# Patient Record
Sex: Female | Born: 1990
Health system: Southern US, Community
[De-identification: ages and names within clinical notes are randomized; demographics above are authoritative.]

## PROBLEM LIST (undated history)

## (undated) DIAGNOSIS — T7840XA Allergy, unspecified, initial encounter: Secondary | ICD-10-CM

## (undated) DIAGNOSIS — E039 Hypothyroidism, unspecified: Secondary | ICD-10-CM

## (undated) DIAGNOSIS — E079 Disorder of thyroid, unspecified: Secondary | ICD-10-CM

## (undated) DIAGNOSIS — E8881 Metabolic syndrome: Secondary | ICD-10-CM

## (undated) DIAGNOSIS — M4310 Spondylolisthesis, site unspecified: Secondary | ICD-10-CM

## (undated) DIAGNOSIS — E88819 Insulin resistance, unspecified: Secondary | ICD-10-CM

## (undated) DIAGNOSIS — F419 Anxiety disorder, unspecified: Secondary | ICD-10-CM

## (undated) DIAGNOSIS — J069 Acute upper respiratory infection, unspecified: Secondary | ICD-10-CM

## (undated) DIAGNOSIS — L309 Dermatitis, unspecified: Secondary | ICD-10-CM

## (undated) DIAGNOSIS — L509 Urticaria, unspecified: Secondary | ICD-10-CM

## (undated) DIAGNOSIS — J45909 Unspecified asthma, uncomplicated: Secondary | ICD-10-CM

## (undated) HISTORY — DX: Urticaria, unspecified: L50.9

## (undated) HISTORY — DX: Unspecified asthma, uncomplicated: J45.909

## (undated) HISTORY — PX: ADENOIDECTOMY: SUR15

## (undated) HISTORY — PX: TONSILLECTOMY: SUR1361

## (undated) HISTORY — DX: Acute upper respiratory infection, unspecified: J06.9

## (undated) HISTORY — DX: Spondylolisthesis, site unspecified: M43.10

## (undated) HISTORY — PX: FRACTURE SURGERY: SHX138

## (undated) HISTORY — DX: Disorder of thyroid, unspecified: E07.9

## (undated) HISTORY — DX: Allergy, unspecified, initial encounter: T78.40XA

## (undated) HISTORY — PX: TYMPANOSTOMY TUBE PLACEMENT: SHX32

## (undated) HISTORY — DX: Dermatitis, unspecified: L30.9

## (undated) HISTORY — DX: Anxiety disorder, unspecified: F41.9

---
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
## 2012-11-15 HISTORY — PX: MANDIBLE SURGERY: SHX707

## 2013-11-12 ENCOUNTER — Ambulatory Visit: Payer: Self-pay | Admitting: Gynecology

## 2013-11-18 ENCOUNTER — Encounter: Payer: Self-pay | Admitting: Nurse Practitioner

## 2013-11-18 ENCOUNTER — Ambulatory Visit (INDEPENDENT_AMBULATORY_CARE_PROVIDER_SITE_OTHER): Payer: BC Managed Care – PPO | Admitting: Nurse Practitioner

## 2013-11-18 VITALS — BP 120/76 | HR 84 | Ht 69.0 in | Wt 208.0 lb

## 2013-11-18 DIAGNOSIS — Z0000 Encounter for general adult medical examination without abnormal findings: Secondary | ICD-10-CM

## 2013-11-18 DIAGNOSIS — Z01419 Encounter for gynecological examination (general) (routine) without abnormal findings: Secondary | ICD-10-CM

## 2013-11-18 MED ORDER — ETONOGESTREL-ETHINYL ESTRADIOL 0.12-0.015 MG/24HR VA RING: VAGINAL_RING | VAGINAL | Status: DC

## 2013-11-18 NOTE — Patient Instructions (Signed)
General topics  Next pap or exam is  due in 1 year Take a Women's multivitamin Take 1200 mg. of calcium daily - prefer dietary If any concerns in interim to call back  Breast Self-Awareness Practicing breast self-awareness may pick up problems early, prevent significant medical complications, and possibly save your life. By practicing breast self-awareness, you can become familiar with how your breasts look and feel and if your breasts are changing. This allows you to notice changes early. It can also offer you some reassurance that your breast health is good. One way to learn what is normal for your breasts and whether your breasts are changing is to do a breast self-exam. If you find a lump or something that was not present in the past, it is best to contact your caregiver right away. Other findings that should be evaluated by your caregiver include nipple discharge, especially if it is bloody; skin changes or reddening; areas where the skin seems to be pulled in (retracted); or new lumps and bumps. Breast pain is seldom associated with cancer (malignancy), but should also be evaluated by a caregiver. BREAST SELF-EXAM The best time to examine your breasts is 5 7 days after your menstrual period is over.  ExitCare Patient Information 2013 [employer].   Exercise to Stay Healthy Exercise helps you become and stay healthy. EXERCISE IDEAS AND TIPS Choose exercises that:  You enjoy.  Fit into your day. You do not need to exercise really hard to be healthy. You can do exercises at a slow or medium level and stay healthy. You can:  Stretch before and after working out.  Try yoga, Pilates, or tai chi.  Lift weights.  Walk fast, swim, jog, run, climb stairs, bicycle, dance, or rollerskate.  Take aerobic classes. Exercises that burn about 150 calories:  Running 1  miles in 15 minutes.  Playing volleyball for 45 to 60 minutes.  Washing and waxing a car for 45 to 60  minutes.  Playing touch football for 45 minutes.  Walking 1  miles in 35 minutes.  Pushing a stroller 1  miles in 30 minutes.  Playing basketball for 30 minutes.  Raking leaves for 30 minutes.  Bicycling 5 miles in 30 minutes.  Walking 2 miles in 30 minutes.  Dancing for 30 minutes.  Shoveling snow for 15 minutes.  Swimming laps for 20 minutes.  Walking up stairs for 15 minutes.  Bicycling 4 miles in 15 minutes.  Gardening for 30 to 45 minutes.  Jumping rope for 15 minutes.  Washing windows or floors for 45 to 60 minutes. Document Released: 01/14/2011 Document Revised: 03/05/2012 Document Reviewed: 01/14/2011 ExitCare Patient Information 2013 [employer].   Other topics ( that may be useful information):    Sexually Transmitted Disease Sexually transmitted disease (STD) refers to any infection that is passed from person to person during sexual activity. This may happen by way of saliva, semen, blood, vaginal mucus, or urine. Common STDs include:  Gonorrhea.  Chlamydia.  Syphilis.  HIV/AIDS.  Genital herpes.  Hepatitis B and C.  Trichomonas.  Human papillomavirus (HPV).  Pubic lice. CAUSES  An STD may be spread by bacteria, virus, or parasite. A person can get an STD by:  Sexual intercourse with an infected person.  Sharing sex toys with an infected person.  Sharing needles with an infected person.  Having intimate contact with the genitals, mouth, or rectal areas of an infected person. SYMPTOMS  Some people may not have any symptoms, but   they can still pass the infection to others. Different STDs have different symptoms. Symptoms include:  Painful or bloody urination.  Pain in the pelvis, abdomen, vagina, anus, throat, or eyes.  Skin rash, itching, irritation, growths, or sores (lesions). These usually occur in the genital or anal area.  Abnormal vaginal discharge.  Penile discharge in men.  Soft, flesh-colored skin growths in the  genital or anal area.  Fever.  Pain or bleeding during sexual intercourse.  Swollen glands in the groin area.  Yellow skin and eyes (jaundice). This is seen with hepatitis. DIAGNOSIS  To make a diagnosis, your caregiver may:  Take a medical history.  Perform a physical exam.  Take a specimen (culture) to be examined.  Examine a sample of discharge under a microscope.  Perform blood test TREATMENT   Chlamydia, gonorrhea, trichomonas, and syphilis can be cured with antibiotic medicine.  Genital herpes, hepatitis, and HIV can be treated, but not cured, with prescribed medicines. The medicines will lessen the symptoms.  Genital warts from HPV can be treated with medicine or by freezing, burning (electrocautery), or surgery. Warts may come back.  HPV is a virus and cannot be cured with medicine or surgery.However, abnormal areas may be followed very closely by your caregiver and may be removed from the cervix, vagina, or vulva through office procedures or surgery. If your diagnosis is confirmed, your recent sexual partners need treatment. This is true even if they are symptom-free or have a negative culture or evaluation. They should not have sex until their caregiver says it is okay. HOME CARE INSTRUCTIONS  All sexual partners should be informed, tested, and treated for all STDs.  Take your antibiotics as directed. Finish them even if you start to feel better.  Only take over-the-counter or prescription medicines for pain, discomfort, or fever as directed by your caregiver.  Rest.  Eat a balanced diet and drink enough fluids to keep your urine clear or pale yellow.  Do not have sex until treatment is completed and you have followed up with your caregiver. STDs should be checked after treatment.  Keep all follow-up appointments, Pap tests, and blood tests as directed by your caregiver.  Only use latex condoms and water-soluble lubricants during sexual activity. Do not use  petroleum jelly or oils.  Avoid alcohol and illegal drugs.  Get vaccinated for HPV and hepatitis. If you have not received these vaccines in the past, talk to your caregiver about whether one or both might be right for you.  Avoid risky sex practices that can break the skin. The only way to avoid getting an STD is to avoid all sexual activity.Latex condoms and dental dams (for oral sex) will help lessen the risk of getting an STD, but will not completely eliminate the risk. SEEK MEDICAL CARE IF:   You have a fever.  You have any new or worsening symptoms. Document Released: 03/04/2003 Document Revised: 03/05/2012 Document Reviewed: 03/11/2011 ExitCare Patient Information 2013 [employer].    Domestic Abuse You are being battered or abused if someone close to you hits, pushes, or physically hurts you in any way. You also are being abused if you are forced into activities. You are being sexually abused if you are forced to have sexual contact of any kind. You are being emotionally abused if you are made to feel worthless or if you are constantly threatened. It is important to remember that help is available. No one has the right to abuse you. PREVENTION OF FURTHER   ABUSE  Learn the warning signs of danger. This varies with situations but may include: the use of alcohol, threats, isolation from friends and family, or forced sexual contact. Leave if you feel that violence is going to occur.  If you are attacked or beaten, report it to the police so the abuse is documented. You do not have to press charges. The police can protect you while you or the attackers are leaving. Get the officer's name and badge number and a copy of the report.  Find someone you can trust and tell them what is happening to you: your caregiver, a nurse, clergy member, close friend or family member. Feeling ashamed is natural, but remember that you have done nothing wrong. No one deserves abuse. Document Released:  12/09/2000 Document Revised: 03/05/2012 Document Reviewed: 02/17/2011 ExitCare Patient Information 2013 [employer].    How Much is Too Much Alcohol? Drinking too much alcohol can cause injury, accidents, and health problems. These types of problems can include:   Car crashes.  Falls.  Family fighting (domestic violence).  Drowning.  Fights.  Injuries.  Burns.  Damage to certain organs.  Having a baby with birth defects. ONE DRINK CAN BE TOO MUCH WHEN YOU ARE:  Working.  Pregnant or breastfeeding.  Taking medicines. Ask your doctor.  Driving or planning to drive. If you or someone you know has a drinking problem, get help from a doctor.  Document Released: 10/08/2009 Document Revised: 03/05/2012 Document Reviewed: 10/08/2009 ExitCare Patient Information 2013 [employer].   Smoking Hazards Smoking cigarettes is extremely bad for your health. Tobacco smoke has over 200 known poisons in it. There are over 60 chemicals in tobacco smoke that cause cancer. Some of the chemicals found in cigarette smoke include:   Cyanide.  Benzene.  Formaldehyde.  Methanol (wood alcohol).  Acetylene (fuel used in welding torches).  Ammonia. Cigarette smoke also contains the poisonous gases nitrogen oxide and carbon monoxide.  Cigarette smokers have an increased risk of many serious medical problems and Smoking causes approximately:  90% of all lung cancer deaths in men.  80% of all lung cancer deaths in women.  90% of deaths from chronic obstructive lung disease. Compared with nonsmokers, smoking increases the risk of:  Coronary heart disease by 2 to 4 times.  Stroke by 2 to 4 times.  Men developing lung cancer by 23 times.  Women developing lung cancer by 13 times.  Dying from chronic obstructive lung diseases by 12 times.  . Smoking is the most preventable cause of death and disease in our society.  WHY IS SMOKING ADDICTIVE?  Nicotine is the chemical  agent in tobacco that is capable of causing addiction or dependence.  When you smoke and inhale, nicotine is absorbed rapidly into the bloodstream through your lungs. Nicotine absorbed through the lungs is capable of creating a powerful addiction. Both inhaled and non-inhaled nicotine may be addictive.  Addiction studies of cigarettes and spit tobacco show that addiction to nicotine occurs mainly during the teen years, when young people begin using tobacco products. WHAT ARE THE BENEFITS OF QUITTING?  There are many health benefits to quitting smoking.   Likelihood of developing cancer and heart disease decreases. Health improvements are seen almost immediately.  Blood pressure, pulse rate, and breathing patterns start returning to normal soon after quitting. QUITTING SMOKING   American Lung Association - 1-800-LUNGUSA  American Cancer Society - 1-800-ACS-2345 Document Released: 01/19/2005 Document Revised: 03/05/2012 Document Reviewed: 09/23/2009 ExitCare Patient Information 2013 [employer].   Stress Management Stress is a state of physical or mental tension that often results from changes in your life or normal routine. Some common causes of stress are:  Death of a loved one.  Injuries or severe illnesses.  Getting fired or changing jobs.  Moving into a new home. Other causes may be:  Sexual problems.  Business or financial losses.  Taking on a large debt.  Regular conflict with someone at home or at work.  Constant tiredness from lack of sleep. It is not just bad things that are stressful. It may be stressful to:  Win the lottery.  Get married.  Buy a new car. The amount of stress that can be easily tolerated varies from person to person. Changes generally cause stress, regardless of the types of change. Too much stress can affect your health. It may lead to physical or emotional problems. Too little stress (boredom) may also become stressful. SUGGESTIONS TO  REDUCE STRESS:  Talk things over with your family and friends. It often is helpful to share your concerns and worries. If you feel your problem is serious, you may want to get help from a professional counselor.  Consider your problems one at a time instead of lumping them all together. Trying to take care of everything at once may seem impossible. List all the things you need to do and then start with the most important one. Set a goal to accomplish 2 or 3 things each day. If you expect to do too many in a single day you will naturally fail, causing you to feel even more stressed.  Do not use alcohol or drugs to relieve stress. Although you may feel better for a short time, they do not remove the problems that caused the stress. They can also be habit forming.  Exercise regularly - at least 3 times per week. Physical exercise can help to relieve that "uptight" feeling and will relax you.  The shortest distance between despair and hope is often a good night's sleep.  Go to bed and get up on time allowing yourself time for appointments without being rushed.  Take a short "time-out" period from any stressful situation that occurs during the day. Close your eyes and take some deep breaths. Starting with the muscles in your face, tense them, hold it for a few seconds, then relax. Repeat this with the muscles in your neck, shoulders, hand, stomach, back and legs.  Take good care of yourself. Eat a balanced diet and get plenty of rest.  Schedule time for having fun. Take a break from your daily routine to relax. HOME CARE INSTRUCTIONS   Call if you feel overwhelmed by your problems and feel you can no longer manage them on your own.  Return immediately if you feel like hurting yourself or someone else. Document Released: 06/07/2001 Document Revised: 03/05/2012 Document Reviewed: 01/28/2008 ExitCare Patient Information 2013 [employer].   

## 2013-11-18 NOTE — Progress Notes (Signed)
Patient ID: Madison Cooper, female   DOB: 02/15/1991, 22 y.o.   MRN: [id number] 22 y.o. G0P0 Single Caucasian Fe here for annual exam.  Same partner for 2 years. Still in school. Menses lasting 3-4 days.  No cramps, some headaches and low back pain.  Patient's last menstrual period was 10/21/2013.          Sexually active: yes  The current method of family planning is Nuva Ring vaginal inserts.    Exercising: yes  Gym/ health club routine includes cardio and light weights. Smoker:  no  Health Maintenance: Pap: never TDaP: 2012 Gardasil: completed in 2006 Labs: declined   reports that she has never smoked. She has never used smokeless tobacco. She reports that she does not drink alcohol or use illicit drugs.  History reviewed. No pertinent past medical history.  Past Surgical History  Procedure Laterality Date  . Tonsillectomy and adenoidectomy  1/12  . Wisdom tooth extraction  12/11  . Ankle surgery Left 2010  . Mandible surgery  11/15/12    Current Outpatient Prescriptions  Medication Sig Dispense Refill  . glucosamine-chondroitin 500-400 MG tablet Take 1 tablet by mouth 3 (three) times daily.      . Multiple Vitamin (MULTIVITAMIN) tablet Take 1 tablet by mouth daily.      Marland Kitchen NUVARING 0.12-0.015 MG/24HR vaginal ring        No current facility-administered medications for this visit.    Family History  Problem Relation Age of Onset  . Heart disease Maternal Grandfather   . Heart disease Paternal Grandfather   . Diabetes Maternal Grandfather   . Diabetes Paternal Grandfather     ROS:  Pertinent items are noted in HPI.  Otherwise, a comprehensive ROS was negative.  Exam:   BP 120/76  Pulse 84  Ht 5\' 9"  (1.753 m)  Wt 208 lb (94.348 kg)  BMI 30.70 kg/m2  LMP 10/21/2013 Height: 5\' 9"  (175.3 cm)  Ht Readings from Last 3 Encounters:  11/18/13 5\' 9"  (1.753 m)    General appearance: alert, cooperative and appears stated age Head: Normocephalic, without obvious abnormality,  atraumatic Neck: no adenopathy, supple, symmetrical, trachea midline and thyroid normal to inspection and palpation Lungs: clear to auscultation bilaterally Breasts: normal appearance, no masses or tenderness Heart: regular rate and rhythm Abdomen: soft, non-tender; no masses,  no organomegaly Extremities: extremities normal, atraumatic, no cyanosis or edema Skin: Skin color, texture, turgor normal. No rashes or lesions Lymph nodes: Cervical, supraclavicular, and axillary nodes normal. No abnormal inguinal nodes palpated Neurologic: Grossly normal   Pelvic: External genitalia:  no lesions              Urethra:  normal appearing urethra with no masses, tenderness or lesions              Bartholin's and Skene's: normal                 Vagina: normal appearing vagina with normal color and discharge, no lesions              Cervix: anteverted              Pap taken: no declined STD's Bimanual Exam:  Uterus:  normal size, contour, position, consistency, mobility, non-tender              Adnexa: no mass, fullness, tenderness               Rectovaginal: Confirms  Anus:  normal sphincter tone, no lesions  A:  Well Woman with normal exam  Contraception with Nuva Ring  P:   Pap smear as per guidelines not due till next year  Refill Nuva Ring for a year   Counseled on breast self exam, STD prevention, adequate intake of calcium and vitamin D, diet and exercise return annually or prn  An After Visit Summary was printed and given to the patient.

## 2013-11-20 NOTE — Progress Notes (Signed)
Encounter reviewed by Dr. Porchea Charrier Silva.  

## 2014-11-27 ENCOUNTER — Ambulatory Visit: Payer: BC Managed Care – PPO | Admitting: Nurse Practitioner

## 2014-12-04 ENCOUNTER — Ambulatory Visit (INDEPENDENT_AMBULATORY_CARE_PROVIDER_SITE_OTHER): Payer: BC Managed Care – PPO | Admitting: Nurse Practitioner

## 2014-12-04 ENCOUNTER — Encounter: Payer: Self-pay | Admitting: Nurse Practitioner

## 2014-12-04 VITALS — BP 120/74 | HR 84 | Resp 16 | Ht 70.5 in | Wt 219.0 lb

## 2014-12-04 DIAGNOSIS — R8290 Unspecified abnormal findings in urine: Secondary | ICD-10-CM

## 2014-12-04 DIAGNOSIS — Z0000 Encounter for general adult medical examination without abnormal findings: Secondary | ICD-10-CM

## 2014-12-04 DIAGNOSIS — Z01419 Encounter for gynecological examination (general) (routine) without abnormal findings: Secondary | ICD-10-CM

## 2014-12-04 LAB — POCT URINALYSIS DIPSTICK
Urobilinogen, UA: NEGATIVE
pH, UA: 5

## 2014-12-04 LAB — HEMOGLOBIN, FINGERSTICK: Hemoglobin, fingerstick: 14.4 g/dL (ref 12.0–16.0)

## 2014-12-04 MED ORDER — ETONOGESTREL-ETHINYL ESTRADIOL 0.12-0.015 MG/24HR VA RING: VAGINAL_RING | VAGINAL | Status: DC

## 2014-12-04 NOTE — Progress Notes (Signed)
23 y.o. G0P0 Single Caucasian Fe here for annual exam.  Menses normally at 3-4 days. Moderate to light.  Occasional heavier every 3-4 months.  Likes Paediatric nurseuva Ring.  Same partner for 3 years.  Patient's last menstrual period was 11/04/2014.          Sexually active: Yes.    The current method of family planning is Paediatric nurseuva Ring vaginal inserts.    Exercising: Yes.    moderate cardio, weight training  2-3 x/wk Smoker:  no  Health Maintenance: Pap:  Never had one TDaP:  Within 10 years  Labs: Hgb: 14.4 ; Urine: Leuk's 2   reports that she has never smoked. She has never used smokeless tobacco. She reports that she does not drink alcohol or use illicit drugs.  History reviewed. No pertinent past medical history.  Past Surgical History  Procedure Laterality Date  . Tonsillectomy and adenoidectomy  1/12  . Wisdom tooth extraction  12/11  . Ankle surgery Left 2010  . Mandible surgery  11/15/12    Current Outpatient Prescriptions  Medication Sig Dispense Refill  . etonogestrel-ethinyl estradiol (NUVARING) 0.12-0.015 MG/24HR vaginal ring Use for 3 weeks, then remove for 1 week. 3 each 3  . glucosamine-chondroitin 500-400 MG tablet Take 1 tablet by mouth 3 (three) times daily.    . Multiple Vitamin (MULTIVITAMIN) tablet Take 1 tablet by mouth daily.     No current facility-administered medications for this visit.    Family History  Problem Relation Age of Onset  . Heart disease Maternal Grandfather   . Heart disease Paternal Grandfather   . Diabetes Maternal Grandfather   . Diabetes Paternal Grandfather     ROS:  Pertinent items are noted in HPI.  Otherwise, a comprehensive ROS was negative.  Exam:   BP 120/74 mmHg  Pulse 84  Resp 16  Ht 5' 10.5" (1.791 m)  Wt 219 lb (99.338 kg)  BMI 30.97 kg/m2  LMP 11/04/2014 Height: 5' 10.5" (179.1 cm)  Ht Readings from Last 3 Encounters:  12/04/14 5' 10.5" (1.791 m)  11/18/13 5\' 9"  (1.753 m)    General appearance: alert, cooperative and  appears stated age Head: Normocephalic, without obvious abnormality, atraumatic Neck: no adenopathy, supple, symmetrical, trachea midline and thyroid normal to inspection and palpation Lungs: clear to auscultation bilaterally Breasts: normal appearance, no masses or tenderness Heart: regular rate and rhythm Abdomen: soft, non-tender; no masses,  no organomegaly Extremities: extremities normal, atraumatic, no cyanosis or edema Skin: Skin color, texture, turgor normal. No rashes or lesions Lymph nodes: Cervical, supraclavicular, and axillary nodes normal. No abnormal inguinal nodes palpated Neurologic: Grossly normal   Pelvic: External genitalia:  no lesions              Urethra:  normal appearing urethra with no masses, tenderness or lesions              Bartholin's and Skene's: normal                 Vagina: normal appearing vagina with normal color and discharge, no lesions              Cervix: anteverted              Pap taken: Yes.   Bimanual Exam:  Uterus:  normal size, contour, position, consistency, mobility, non-tender              Adnexa: no mass, fullness, tenderness  Rectovaginal: Confirms               Anus:  normal sphincter tone, no lesions  A:  Well Woman with normal exam  Contraception with Nuva Ring  History of arthritis  R/O UTI - asymptomatic - no med's given    P:   Reviewed health and wellness pertinent to exam  Pap smear taken today (1 st pap)  Refill Nuva Ring for a year  Follow with urine  Counseled on breast self exam, STD prevention, use and side effects of OCP's, adequate intake of calcium and vitamin D, diet and exercise return annually or prn  An After Visit Summary was printed and given to the patient.

## 2014-12-04 NOTE — Patient Instructions (Signed)
General topics  Next pap or exam is  due in 1 year Take a Women's multivitamin Take 1200 mg. of calcium daily - prefer dietary If any concerns in interim to call back  Breast Self-Awareness Practicing breast self-awareness may pick up problems early, prevent significant medical complications, and possibly save your life. By practicing breast self-awareness, you can become familiar with how your breasts look and feel and if your breasts are changing. This allows you to notice changes early. It can also offer you some reassurance that your breast health is good. One way to learn what is normal for your breasts and whether your breasts are changing is to do a breast self-exam. If you find a lump or something that was not present in the past, it is best to contact your caregiver right away. Other findings that should be evaluated by your caregiver include nipple discharge, especially if it is bloody; skin changes or reddening; areas where the skin seems to be pulled in (retracted); or new lumps and bumps. Breast pain is seldom associated with cancer (malignancy), but should also be evaluated by a caregiver. BREAST SELF-EXAM The best time to examine your breasts is 5 7 days after your menstrual period is over.  ExitCare Patient Information 2013 [employer].   Exercise to Stay Healthy Exercise helps you become and stay healthy. EXERCISE IDEAS AND TIPS Choose exercises that:  You enjoy.  Fit into your day. You do not need to exercise really hard to be healthy. You can do exercises at a slow or medium level and stay healthy. You can:  Stretch before and after working out.  Try yoga, Pilates, or tai chi.  Lift weights.  Walk fast, swim, jog, run, climb stairs, bicycle, dance, or rollerskate.  Take aerobic classes. Exercises that burn about 150 calories:  Running 1  miles in 15 minutes.  Playing volleyball for 45 to 60 minutes.  Washing and waxing a car for 45 to 60  minutes.  Playing touch football for 45 minutes.  Walking 1  miles in 35 minutes.  Pushing a stroller 1  miles in 30 minutes.  Playing basketball for 30 minutes.  Raking leaves for 30 minutes.  Bicycling 5 miles in 30 minutes.  Walking 2 miles in 30 minutes.  Dancing for 30 minutes.  Shoveling snow for 15 minutes.  Swimming laps for 20 minutes.  Walking up stairs for 15 minutes.  Bicycling 4 miles in 15 minutes.  Gardening for 30 to 45 minutes.  Jumping rope for 15 minutes.  Washing windows or floors for 45 to 60 minutes. Document Released: 01/14/2011 Document Revised: 03/05/2012 Document Reviewed: 01/14/2011 ExitCare Patient Information 2013 [employer].   Other topics ( that may be useful information):    Sexually Transmitted Disease Sexually transmitted disease (STD) refers to any infection that is passed from person to person during sexual activity. This may happen by way of saliva, semen, blood, vaginal mucus, or urine. Common STDs include:  Gonorrhea.  Chlamydia.  Syphilis.  HIV/AIDS.  Genital herpes.  Hepatitis B and C.  Trichomonas.  Human papillomavirus (HPV).  Pubic lice. CAUSES  An STD may be spread by bacteria, virus, or parasite. A person can get an STD by:  Sexual intercourse with an infected person.  Sharing sex toys with an infected person.  Sharing needles with an infected person.  Having intimate contact with the genitals, mouth, or rectal areas of an infected person. SYMPTOMS  Some people may not have any symptoms, but   they can still pass the infection to others. Different STDs have different symptoms. Symptoms include:  Painful or bloody urination.  Pain in the pelvis, abdomen, vagina, anus, throat, or eyes.  Skin rash, itching, irritation, growths, or sores (lesions). These usually occur in the genital or anal area.  Abnormal vaginal discharge.  Penile discharge in men.  Soft, flesh-colored skin growths in the  genital or anal area.  Fever.  Pain or bleeding during sexual intercourse.  Swollen glands in the groin area.  Yellow skin and eyes (jaundice). This is seen with hepatitis. DIAGNOSIS  To make a diagnosis, your caregiver may:  Take a medical history.  Perform a physical exam.  Take a specimen (culture) to be examined.  Examine a sample of discharge under a microscope.  Perform blood test TREATMENT   Chlamydia, gonorrhea, trichomonas, and syphilis can be cured with antibiotic medicine.  Genital herpes, hepatitis, and HIV can be treated, but not cured, with prescribed medicines. The medicines will lessen the symptoms.  Genital warts from HPV can be treated with medicine or by freezing, burning (electrocautery), or surgery. Warts may come back.  HPV is a virus and cannot be cured with medicine or surgery.However, abnormal areas may be followed very closely by your caregiver and may be removed from the cervix, vagina, or vulva through office procedures or surgery. If your diagnosis is confirmed, your recent sexual partners need treatment. This is true even if they are symptom-free or have a negative culture or evaluation. They should not have sex until their caregiver says it is okay. HOME CARE INSTRUCTIONS  All sexual partners should be informed, tested, and treated for all STDs.  Take your antibiotics as directed. Finish them even if you start to feel better.  Only take over-the-counter or prescription medicines for pain, discomfort, or fever as directed by your caregiver.  Rest.  Eat a balanced diet and drink enough fluids to keep your urine clear or pale yellow.  Do not have sex until treatment is completed and you have followed up with your caregiver. STDs should be checked after treatment.  Keep all follow-up appointments, Pap tests, and blood tests as directed by your caregiver.  Only use latex condoms and water-soluble lubricants during sexual activity. Do not use  petroleum jelly or oils.  Avoid alcohol and illegal drugs.  Get vaccinated for HPV and hepatitis. If you have not received these vaccines in the past, talk to your caregiver about whether one or both might be right for you.  Avoid risky sex practices that can break the skin. The only way to avoid getting an STD is to avoid all sexual activity.Latex condoms and dental dams (for oral sex) will help lessen the risk of getting an STD, but will not completely eliminate the risk. SEEK MEDICAL CARE IF:   You have a fever.  You have any new or worsening symptoms. Document Released: 03/04/2003 Document Revised: 03/05/2012 Document Reviewed: 03/11/2011 [facility] Patient Information 2013 [employer].    Domestic Abuse You are being battered or abused if someone close to you hits, pushes, or physically hurts you in any way. You also are being abused if you are forced into activities. You are being sexually abused if you are forced to have sexual contact of any kind. You are being emotionally abused if you are made to feel worthless or if you are constantly threatened. It is important to remember that help is available. No one has the right to abuse you. PREVENTION OF FURTHER  ABUSE  Learn the warning signs of danger. This varies with situations but may include: the use of alcohol, threats, isolation from friends and family, or forced sexual contact. Leave if you feel that violence is going to occur.  If you are attacked or beaten, report it to the police so the abuse is documented. You do not have to press charges. The police can protect you while you or the attackers are leaving. Get the officer's name and badge number and a copy of the report.  Find someone you can trust and tell them what is happening to you: your caregiver, a nurse, clergy member, close friend or family member. Feeling ashamed is natural, but remember that you have done nothing wrong. No one deserves abuse. Document Released:  12/09/2000 Document Revised: 03/05/2012 Document Reviewed: 02/17/2011 ExitCare Patient Information 2013 [employer].    How Much is Too Much Alcohol? Drinking too much alcohol can cause injury, accidents, and health problems. These types of problems can include:   Car crashes.  Falls.  Family fighting (domestic violence).  Drowning.  Fights.  Injuries.  Burns.  Damage to certain organs.  Having a baby with birth defects. ONE DRINK CAN BE TOO MUCH WHEN YOU ARE:  Working.  Pregnant or breastfeeding.  Taking medicines. Ask your doctor.  Driving or planning to drive. If you or someone you know has a drinking problem, get help from a doctor.  Document Released: 10/08/2009 Document Revised: 03/05/2012 Document Reviewed: 10/08/2009 ExitCare Patient Information 2013 [employer].   Smoking Hazards Smoking cigarettes is extremely bad for your health. Tobacco smoke has over 200 known poisons in it. There are over 60 chemicals in tobacco smoke that cause cancer. Some of the chemicals found in cigarette smoke include:   Cyanide.  Benzene.  Formaldehyde.  Methanol (wood alcohol).  Acetylene (fuel used in welding torches).  Ammonia. Cigarette smoke also contains the poisonous gases nitrogen oxide and carbon monoxide.  Cigarette smokers have an increased risk of many serious medical problems and Smoking causes approximately:  90% of all lung cancer deaths in men.  80% of all lung cancer deaths in women.  90% of deaths from chronic obstructive lung disease. Compared with nonsmokers, smoking increases the risk of:  Coronary heart disease by 2 to 4 times.  Stroke by 2 to 4 times.  Men developing lung cancer by 23 times.  Women developing lung cancer by 13 times.  Dying from chronic obstructive lung diseases by 12 times.  . Smoking is the most preventable cause of death and disease in our society.  WHY IS SMOKING ADDICTIVE?  Nicotine is the chemical  agent in tobacco that is capable of causing addiction or dependence.  When you smoke and inhale, nicotine is absorbed rapidly into the bloodstream through your lungs. Nicotine absorbed through the lungs is capable of creating a powerful addiction. Both inhaled and non-inhaled nicotine may be addictive.  Addiction studies of cigarettes and spit tobacco show that addiction to nicotine occurs mainly during the teen years, when young people begin using tobacco products. WHAT ARE THE BENEFITS OF QUITTING?  There are many health benefits to quitting smoking.   Likelihood of developing cancer and heart disease decreases. Health improvements are seen almost immediately.  Blood pressure, pulse rate, and breathing patterns start returning to normal soon after quitting. QUITTING SMOKING   American Lung Association - 1-800-LUNGUSA  American Cancer Society - 1-800-ACS-2345 Document Released: 01/19/2005 Document Revised: 03/05/2012 Document Reviewed: 09/23/2009 ExitCare Patient Information 2013 [employer].   Stress Management Stress is a state of physical or mental tension that often results from changes in your life or normal routine. Some common causes of stress are:  Death of a loved one.  Injuries or severe illnesses.  Getting fired or changing jobs.  Moving into a new home. Other causes may be:  Sexual problems.  Business or financial losses.  Taking on a large debt.  Regular conflict with someone at home or at work.  Constant tiredness from lack of sleep. It is not just bad things that are stressful. It may be stressful to:  Win the lottery.  Get married.  Buy a new car. The amount of stress that can be easily tolerated varies from person to person. Changes generally cause stress, regardless of the types of change. Too much stress can affect your health. It may lead to physical or emotional problems. Too little stress (boredom) may also become stressful. SUGGESTIONS TO  REDUCE STRESS:  Talk things over with your family and friends. It often is helpful to share your concerns and worries. If you feel your problem is serious, you may want to get help from a professional counselor.  Consider your problems one at a time instead of lumping them all together. Trying to take care of everything at once may seem impossible. List all the things you need to do and then start with the most important one. Set a goal to accomplish 2 or 3 things each day. If you expect to do too many in a single day you will naturally fail, causing you to feel even more stressed.  Do not use alcohol or drugs to relieve stress. Although you may feel better for a short time, they do not remove the problems that caused the stress. They can also be habit forming.  Exercise regularly - at least 3 times per week. Physical exercise can help to relieve that "uptight" feeling and will relax you.  The shortest distance between despair and hope is often a good night's sleep.  Go to bed and get up on time allowing yourself time for appointments without being rushed.  Take a short "time-out" period from any stressful situation that occurs during the day. Close your eyes and take some deep breaths. Starting with the muscles in your face, tense them, hold it for a few seconds, then relax. Repeat this with the muscles in your neck, shoulders, hand, stomach, back and legs.  Take good care of yourself. Eat a balanced diet and get plenty of rest.  Schedule time for having fun. Take a break from your daily routine to relax. HOME CARE INSTRUCTIONS   Call if you feel overwhelmed by your problems and feel you can no longer manage them on your own.  Return immediately if you feel like hurting yourself or someone else. Document Released: 06/07/2001 Document Revised: 03/05/2012 Document Reviewed: 01/28/2008 ExitCare Patient Information 2013 [employer].  

## 2014-12-06 LAB — URINE CULTURE
Colony Count: NO GROWTH
Organism ID, Bacteria: NO GROWTH

## 2014-12-07 LAB — IPS PAP TEST WITH REFLEX TO HPV

## 2014-12-07 NOTE — Progress Notes (Signed)
Encounter reviewed by Dr. Ibrahem Volkman Silva.  

## 2015-12-07 ENCOUNTER — Ambulatory Visit (INDEPENDENT_AMBULATORY_CARE_PROVIDER_SITE_OTHER): Payer: 59 | Admitting: Nurse Practitioner

## 2015-12-07 ENCOUNTER — Encounter: Payer: Self-pay | Admitting: Nurse Practitioner

## 2015-12-07 VITALS — BP 120/76 | HR 72 | Ht 70.0 in | Wt 239.0 lb

## 2015-12-07 DIAGNOSIS — Z113 Encounter for screening for infections with a predominantly sexual mode of transmission: Secondary | ICD-10-CM

## 2015-12-07 DIAGNOSIS — Z0000 Encounter for general adult medical examination without abnormal findings: Secondary | ICD-10-CM

## 2015-12-07 DIAGNOSIS — Z01419 Encounter for gynecological examination (general) (routine) without abnormal findings: Secondary | ICD-10-CM

## 2015-12-07 MED ORDER — ETONOGESTREL-ETHINYL ESTRADIOL 0.12-0.015 MG/24HR VA RING: VAGINAL_RING | VAGINAL | Status: DC

## 2015-12-07 NOTE — Progress Notes (Signed)
Patient ID: Madison Cooper, female   DOB: August 13, 1991, 24 y.o.   MRN: [id number]  24 y.o. G0P0 Single  Caucasian Fe here for annual exam.  Menses now at 3-4 days. No cramps, some PMS.  Same partner for 4 yrs. No concerns about STD's.  Still in school and doing internship now.  Patient's last menstrual period was 11/25/2015 (exact date).          Sexually active: Yes.    The current method of family planning is Paediatric nurse vaginal inserts.    Exercising: Yes.    cardio and weights Smoker:  no  Health Maintenance: Pap: 12/04/14, Negative TDaP: UTD Gardasil: completed in 2006 Labs: HB: 13.7   Urine: Negative    reports that she has never smoked. She has never used smokeless tobacco. She reports that she does not drink alcohol or use illicit drugs.  History reviewed. No pertinent past medical history.  Past Surgical History  Procedure Laterality Date  . Tonsillectomy and adenoidectomy  1/12  . Wisdom tooth extraction  12/11  . Ankle surgery Left 2010  . Mandible surgery  11/15/12    Current Outpatient Prescriptions  Medication Sig Dispense Refill  . etonogestrel-ethinyl estradiol (NUVARING) 0.12-0.015 MG/24HR vaginal ring Use for 3 weeks, then remove for 1 week. 3 each 4   No current facility-administered medications for this visit.    Family History  Problem Relation Age of Onset  . Heart disease Maternal Grandfather   . Heart disease Paternal Grandfather   . Diabetes Maternal Grandfather   . Diabetes Paternal Grandfather     ROS:  Pertinent items are noted in HPI.  Otherwise, a comprehensive ROS was negative.  Exam:   BP 120/76 mmHg  Pulse 72  Ht  (1.778 m)  Wt 239 lb (108.41 kg)  BMI 34.29 kg/m2  LMP 11/25/2015 (Exact Date) Height:  (177.8 cm) Ht Readings from Last 3 Encounters:  12/07/15  (1.778 m)  12/04/14 5' 10.5" (1.791 m)  11/18/13  (1.753 m)    General appearance: alert, cooperative and appears stated age Head: Normocephalic, without  obvious abnormality, atraumatic Neck: no adenopathy, supple, symmetrical, trachea midline and thyroid normal to inspection and palpation Lungs: clear to auscultation bilaterally Breasts: normal appearance, no masses or tenderness Heart: regular rate and rhythm Abdomen: soft, non-tender; no masses,  no organomegaly Extremities: extremities normal, atraumatic, no cyanosis or edema Skin: Skin color, texture, turgor normal. No rashes or lesions Lymph nodes: Cervical, supraclavicular, and axillary nodes normal. No abnormal inguinal nodes palpated Neurologic: Grossly normal   Pelvic: External genitalia:  no lesions              Urethra:  normal appearing urethra with no masses, tenderness or lesions              Bartholin's and Skene's: normal                 Vagina: normal appearing vagina with normal color and discharge, no lesions              Cervix: anteverted              Pap taken: No. Bimanual Exam:  Uterus:  normal size, contour, position, consistency, mobility, non-tender              Adnexa: no mass, fullness, tenderness               Rectovaginal: Confirms  Anus:  normal sphincter tone, no lesions  Chaperone present: yes  A:  Well Woman with normal exam  Contraception with Nuva Ring History of arthritis R/O STD's to update health maintenance    P:   Reviewed health and wellness pertinent to exam  Pap smear as above  Refill on Nuva Ring for a year  Counseled on breast self exam, STD prevention, HIV risk factors and prevention, use and side effects of Nuva Ring, adequate intake of calcium and vitamin D, diet and exercise return annually or prn  An After Visit Summary was printed and given to the patient.

## 2015-12-07 NOTE — Patient Instructions (Addendum)
General topics  Next pap or exam is  due in 1 year Take a Women's multivitamin Take 1200 mg. of calcium daily - prefer dietary If any concerns in interim to call back  Breast Self-Awareness Practicing breast self-awareness may pick up problems early, prevent significant medical complications, and possibly save your life. By practicing breast self-awareness, you can become familiar with how your breasts look and feel and if your breasts are changing. This allows you to notice changes early. It can also offer you some reassurance that your breast health is good. One way to learn what is normal for your breasts and whether your breasts are changing is to do a breast self-exam. If you find a lump or something that was not present in the past, it is best to contact your caregiver right away. Other findings that should be evaluated by your caregiver include nipple discharge, especially if it is bloody; skin changes or reddening; areas where the skin seems to be pulled in (retracted); or new lumps and bumps. Breast pain is seldom associated with cancer (malignancy), but should also be evaluated by a caregiver. BREAST SELF-EXAM The best time to examine your breasts is 5 7 days after your menstrual period is over.  Exit[employer] Patient Information 2013 [place].   Exercise to Stay Healthy Exercise helps you become and stay healthy. EXERCISE IDEAS AND TIPS Choose exercises that:  You enjoy.  Fit into your day. You do not need to exercise really hard to be healthy. You can do exercises at a slow or medium level and stay healthy. You can:  Stretch before and after working out.  Try yoga, Pilates, or tai chi.  Lift weights.  Walk fast, swim, jog, run, climb stairs, bicycle, dance, or rollerskate.  Take aerobic classes. Exercises that burn about 150 calories:  Running 1  miles in 15 minutes.  Playing volleyball for 45 to 60 minutes.  Washing and waxing a car for 45 to 60  minutes.  Playing touch football for 45 minutes.  Walking 1  miles in 35 minutes.  Pushing a stroller 1  miles in 30 minutes.  Playing basketball for 30 minutes.  Raking leaves for 30 minutes.  Bicycling 5 miles in 30 minutes.  Walking 2 miles in 30 minutes.  Dancing for 30 minutes.  Shoveling snow for 15 minutes.  Swimming laps for 20 minutes.  Walking up stairs for 15 minutes.  Bicycling 4 miles in 15 minutes.  Gardening for 30 to 45 minutes.  Jumping rope for 15 minutes.  Washing windows or floors for 45 to 60 minutes. Document Released: 01/14/2011 Document Revised: 03/05/2012 Document Reviewed: 01/14/2011 [facility] Patient Information 2013 [place].   Other topics ( that may be useful information):    Sexually Transmitted Disease Sexually transmitted disease (STD) refers to any infection that is passed from person to person during sexual activity. This may happen by way of saliva, semen, blood, vaginal mucus, or urine. Common STDs include:  Gonorrhea.  Chlamydia.  Syphilis.  HIV/AIDS.  Genital herpes.  Hepatitis B and C.  Trichomonas.  Human papillomavirus (HPV).  Pubic lice. CAUSES  An STD may be spread by bacteria, virus, or parasite. A person can get an STD by:  Sexual intercourse with an infected person.  Sharing sex toys with an infected person.  Sharing needles with an infected person.  Having intimate contact with the genitals, mouth, or rectal areas of an infected person. SYMPTOMS  Some people may not have any symptoms, but  they can still pass the infection to others. Different STDs have different symptoms. Symptoms include:  Painful or bloody urination.  Pain in the pelvis, abdomen, vagina, anus, throat, or eyes.  Skin rash, itching, irritation, growths, or sores (lesions). These usually occur in the genital or anal area.  Abnormal vaginal discharge.  Penile discharge in men.  Soft, flesh-colored skin growths in the  genital or anal area.  Fever.  Pain or bleeding during sexual intercourse.  Swollen glands in the groin area.  Yellow skin and eyes (jaundice). This is seen with hepatitis. DIAGNOSIS  To make a diagnosis, your caregiver may:  Take a medical history.  Perform a physical exam.  Take a specimen (culture) to be examined.  Examine a sample of discharge under a microscope.  Perform blood test TREATMENT   Chlamydia, gonorrhea, trichomonas, and syphilis can be cured with antibiotic medicine.  Genital herpes, hepatitis, and HIV can be treated, but not cured, with prescribed medicines. The medicines will lessen the symptoms.  Genital warts from HPV can be treated with medicine or by freezing, burning (electrocautery), or surgery. Warts may come back.  HPV is a virus and cannot be cured with medicine or surgery.However, abnormal areas may be followed very closely by your caregiver and may be removed from the cervix, vagina, or vulva through office procedures or surgery. If your diagnosis is confirmed, your recent sexual partners need treatment. This is true even if they are symptom-free or have a negative culture or evaluation. They should not have sex until their caregiver says it is okay. HOME CARE INSTRUCTIONS  All sexual partners should be informed, tested, and treated for all STDs.  Take your antibiotics as directed. Finish them even if you start to feel better.  Only take over-the-counter or prescription medicines for pain, discomfort, or fever as directed by your caregiver.  Rest.  Eat a balanced diet and drink enough fluids to keep your urine clear or pale yellow.  Do not have sex until treatment is completed and you have followed up with your caregiver. STDs should be checked after treatment.  Keep all follow-up appointments, Pap tests, and blood tests as directed by your caregiver.  Only use latex condoms and water-soluble lubricants during sexual activity. Do not use  petroleum jelly or oils.  Avoid alcohol and illegal drugs.  Get vaccinated for HPV and hepatitis. If you have not received these vaccines in the past, talk to your caregiver about whether one or both might be right for you.  Avoid risky sex practices that can break the skin. The only way to avoid getting an STD is to avoid all sexual activity.Latex condoms and dental dams (for oral sex) will help lessen the risk of getting an STD, but will not completely eliminate the risk. SEEK MEDICAL CARE IF:   You have a fever.  You have any new or worsening symptoms. Document Released: 03/04/2003 Document Revised: 03/05/2012 Document Reviewed: 03/11/2011 [facility] Patient Information 2013 [place].    Domestic Abuse You are being battered or abused if someone close to you hits, pushes, or physically hurts you in any way. You also are being abused if you are forced into activities. You are being sexually abused if you are forced to have sexual contact of any kind. You are being emotionally abused if you are made to feel worthless or if you are constantly threatened. It is important to remember that help is available. No one has the right to abuse you. PREVENTION OF FURTHER  ABUSE  Learn the warning signs of danger. This varies with situations but may include: the use of alcohol, threats, isolation from friends and family, or forced sexual contact. Leave if you feel that violence is going to occur.  If you are attacked or beaten, report it to the police so the abuse is documented. You do not have to press charges. The police can protect you while you or the attackers are leaving. Get the officer's name and badge number and a copy of the report.  Find someone you can trust and tell them what is happening to you: your caregiver, a nurse, clergy member, close friend or family member. Feeling ashamed is natural, but remember that you have done nothing wrong. No one deserves abuse. Document Released:  12/09/2000 Document Revised: 03/05/2012 Document Reviewed: 02/17/2011 [facility] Patient Information 2013 [place].    How Much is Too Much Alcohol? Drinking too much alcohol can cause injury, accidents, and health problems. These types of problems can include:   Car crashes.  Falls.  Family fighting (domestic violence).  Drowning.  Fights.  Injuries.  Burns.  Damage to certain organs.  Having a baby with birth defects. ONE DRINK CAN BE TOO MUCH WHEN YOU ARE:  Working.  Pregnant or breastfeeding.  Taking medicines. Ask your doctor.  Driving or planning to drive. If you or someone you know has a drinking problem, get help from a doctor.  Document Released: 10/08/2009 Document Revised: 03/05/2012 Document Reviewed: 10/08/2009 [facility] Patient Information 2013 [place].   Smoking Hazards Smoking cigarettes is extremely bad for your health. Tobacco smoke has over 200 known poisons in it. There are over 60 chemicals in tobacco smoke that cause cancer. Some of the chemicals found in cigarette smoke include:   Cyanide.  Benzene.  Formaldehyde.  Methanol (wood alcohol).  Acetylene (fuel used in welding torches).  Ammonia. Cigarette smoke also contains the poisonous gases nitrogen oxide and carbon monoxide.  Cigarette smokers have an increased risk of many serious medical problems and Smoking causes approximately:  90% of all lung cancer deaths in men.  80% of all lung cancer deaths in women.  90% of deaths from chronic obstructive lung disease. Compared with nonsmokers, smoking increases the risk of:  Coronary heart disease by 2 to 4 times.  Stroke by 2 to 4 times.  Men developing lung cancer by 23 times.  Women developing lung cancer by 13 times.  Dying from chronic obstructive lung diseases by 12 times.  . Smoking is the most preventable cause of death and disease in our society.  WHY IS SMOKING ADDICTIVE?  Nicotine is the chemical  agent in tobacco that is capable of causing addiction or dependence.  When you smoke and inhale, nicotine is absorbed rapidly into the bloodstream through your lungs. Nicotine absorbed through the lungs is capable of creating a powerful addiction. Both inhaled and non-inhaled nicotine may be addictive.  Addiction studies of cigarettes and spit tobacco show that addiction to nicotine occurs mainly during the teen years, when young people begin using tobacco products. WHAT ARE THE BENEFITS OF QUITTING?  There are many health benefits to quitting smoking.   Likelihood of developing cancer and heart disease decreases. Health improvements are seen almost immediately.  Blood pressure, pulse rate, and breathing patterns start returning to normal soon after quitting. QUITTING SMOKING   American Lung Association - 1-800-LUNGUSA  American Cancer Society - 1-800-ACS-2345 Document Released: 01/19/2005 Document Revised: 03/05/2012 Document Reviewed: 09/23/2009 [facility] Patient Information 2013 [place].   Stress Management Stress is a state of physical or mental tension that often results from changes in your life or normal routine. Some common causes of stress are:  Death of a loved one.  Injuries or severe illnesses.  Getting fired or changing jobs.  Moving into a new home. Other causes may be:  Sexual problems.  Business or financial losses.  Taking on a large debt.  Regular conflict with someone at home or at work.  Constant tiredness from lack of sleep. It is not just bad things that are stressful. It may be stressful to:  Win the lottery.  Get married.  Buy a new car. The amount of stress that can be easily tolerated varies from person to person. Changes generally cause stress, regardless of the types of change. Too much stress can affect your health. It may lead to physical or emotional problems. Too little stress (boredom) may also become stressful. SUGGESTIONS TO  REDUCE STRESS:  Talk things over with your family and friends. It often is helpful to share your concerns and worries. If you feel your problem is serious, you may want to get help from a professional counselor.  Consider your problems one at a time instead of lumping them all together. Trying to take care of everything at once may seem impossible. List all the things you need to do and then start with the most important one. Set a goal to accomplish 2 or 3 things each day. If you expect to do too many in a single day you will naturally fail, causing you to feel even more stressed.  Do not use alcohol or drugs to relieve stress. Although you may feel better for a short time, they do not remove the problems that caused the stress. They can also be habit forming.  Exercise regularly - at least 3 times per week. Physical exercise can help to relieve that "uptight" feeling and will relax you.  The shortest distance between despair and hope is often a good night's sleep.  Go to bed and get up on time allowing yourself time for appointments without being rushed.  Take a short "time-out" period from any stressful situation that occurs during the day. Close your eyes and take some deep breaths. Starting with the muscles in your face, tense them, hold it for a few seconds, then relax. Repeat this with the muscles in your neck, shoulders, hand, stomach, back and legs.  Take good care of yourself. Eat a balanced diet and get plenty of rest.  Schedule time for having fun. Take a break from your daily routine to relax. HOME CARE INSTRUCTIONS   Call if you feel overwhelmed by your problems and feel you can no longer manage them on your own.  Return immediately if you feel like hurting yourself or someone else. Document Released: 06/07/2001 Document Revised: 03/05/2012 Document Reviewed: 01/28/2008 [facility] Patient Information 2013 [place].   Please get date of TDaP

## 2015-12-08 LAB — STD PANEL
HIV 1&2 Ab, 4th Generation: NONREACTIVE
Hepatitis B Surface Ag: NEGATIVE

## 2015-12-08 LAB — HEMOGLOBIN, FINGERSTICK: Hemoglobin, fingerstick: 13.7 g/dL (ref 12.0–16.0)

## 2015-12-09 LAB — IPS N GONORRHOEA AND CHLAMYDIA BY PCR

## 2015-12-09 NOTE — Progress Notes (Signed)
Encounter reviewed Maeson Lourenco, MD   

## 2015-12-15 ENCOUNTER — Telehealth: Payer: Self-pay | Admitting: *Deleted

## 2015-12-15 NOTE — Telephone Encounter (Signed)
-----   Message from Ria CommentPatricia Grubb, FNP sent at 12/09/2015  5:05 PM EST ----- Please let pt know that STD's with HIV, Hep B, STS, GC, and Chl is all negative.

## 2015-12-15 NOTE — Telephone Encounter (Signed)
I have attempted to contact this patient by phone with the following results: left message to return call to [place] at [phone] on answering machine (mobile [facility]).  No personal information given.  [phone] (Mobile) *Preferred*

## 2015-12-23 ENCOUNTER — Encounter (HOSPITAL_COMMUNITY): Payer: Self-pay | Admitting: Emergency Medicine

## 2015-12-23 ENCOUNTER — Emergency Department (HOSPITAL_COMMUNITY)
Admission: EM | Admit: 2015-12-23 | Discharge: 2015-12-23 | Disposition: A | Discharge status: Home / Self Care | Payer: 59 | Source: Home / Self Care | Attending: Emergency Medicine | Admitting: Emergency Medicine

## 2015-12-23 DIAGNOSIS — J069 Acute upper respiratory infection, unspecified: Secondary | ICD-10-CM | POA: Diagnosis not present

## 2015-12-23 MED ORDER — FLUTICASONE PROPIONATE 50 MCG/ACT NA SUSP: 2.0000 | Freq: Every day | NASAL | Status: DC

## 2015-12-23 MED ORDER — CETIRIZINE HCL 10 MG PO TABS: 10.0000 mg | ORAL_TABLET | Freq: Every day | ORAL | Status: DC

## 2015-12-23 NOTE — ED Provider Notes (Signed)
CSN: [id number]     Arrival date & time 12/23/15  1543 History   First MD Initiated Contact with Patient 12/23/15 1656     Chief Complaint  Patient presents with  . Sinus Problem   (Consider location/radiation/quality/duration/timing/severity/associated sxs/prior Treatment) HPI  She is a 24 year old woman here for evaluation of nasal congestion. She states her symptoms started yesterday with nasal congestion, postnasal drainage, sore throat, and body aches. She states for the last 3-4 days she has been having on and off fevers up to 101-102. She denies any nausea or vomiting. No abdominal pain or diarrhea. No cough or shortness of breath. She is taking Mucinex with some improvement. She states multiple other people been sick with similar symptoms.  History reviewed. No pertinent past medical history. Past Surgical History  Procedure Laterality Date  . Tonsillectomy and adenoidectomy  1/12  . Wisdom tooth extraction  12/11  . Ankle surgery Left 2010  . Mandible surgery  11/15/12   Family History  Problem Relation Age of Onset  . Heart disease Maternal Grandfather   . Heart disease Paternal Grandfather   . Diabetes Maternal Grandfather   . Diabetes Paternal Grandfather    Social History  Substance Use Topics  . Smoking status: Never Smoker   . Smokeless tobacco: Never Used  . Alcohol Use: No     Comment: Less than 1    OB History    Gravida Para Term Preterm AB TAB SAB Ectopic Multiple Living   0 0 0 0 0 0 0 0 0 0      Review of Systems As in history of present illness Allergies  Sulfa antibiotics  Home Medications   Prior to Admission medications   Medication Sig Start Date End Date Taking? Authorizing Provider  cetirizine (ZYRTEC) 10 MG tablet Take 1 tablet (10 mg total) by mouth daily. 12/23/15   Charm RingsErin J Ransome Helwig, MD  etonogestrel-ethinyl estradiol (NUVARING) 0.12-0.015 MG/24HR vaginal ring Use for 3 weeks, then remove for 1 week. 12/07/15   Ria CommentPatricia Grubb, FNP   fluticasone (FLONASE) 50 MCG/ACT nasal spray Place 2 sprays into both nostrils daily. 12/23/15   Charm RingsErin J Nygel Prokop, MD   Meds Ordered and Administered this Visit  Medications - No data to display  BP 136/88 mmHg  Pulse 88  Temp(Src) 99.4 F (37.4 C) (Oral)  Resp 18  SpO2 99%  LMP 11/27/2015 No data found.   Physical Exam  Constitutional: She is oriented to person, place, and time. She appears well-developed and well-nourished. No distress.  HENT:  Mouth/Throat: No oropharyngeal exudate.  Mild oropharyngeal erythema with clear postnasal drainage. TMs normal bilaterally. Nasal mucosa is swollen and erythematous.  Eyes: Conjunctivae are normal.  Neck: Neck supple.  Cardiovascular: Normal rate, regular rhythm and normal heart sounds.   No murmur heard. Pulmonary/Chest: Effort normal and breath sounds normal. No respiratory distress. She has no wheezes. She has no rales.  Lymphadenopathy:    She has no cervical adenopathy.  Neurological: She is alert and oriented to person, place, and time.    ED Course  Procedures (including critical care time)  Labs Review Labs Reviewed - No data to display  Imaging Review No results found.    MDM   1. Viral URI    Symptomatic treatment with rest, fluids, Tylenol or ibuprofen. Cetirizine and Flonase to help with congestion and drainage. Recommended staying home until she has been afebrile for 24 hours. Follow-up as needed.    Charm RingsErin J Emalia Witkop, MD 12/23/15 [phone]

## 2015-12-23 NOTE — Discharge Instructions (Signed)
You have a viral upper respiratory infection. You can take Tylenol or ibuprofen as needed for fever or body aches. Use Flonase daily to help with congestion and drainage. Take Zyrtec daily or another over-the-counter allergy medicine to help with the congestion. These things typically last 5-7 days. Her symptoms are getting worse, please come back.

## 2015-12-23 NOTE — ED Notes (Signed)
Here with acute sinus sx's that started yesterday Sinus pressure, headache, nasal/head congestion and now body aches Taking Dayquil and Mucinex

## 2016-01-21 NOTE — Telephone Encounter (Signed)
Pt notified in result note.  Closing encounter. 

## 2016-02-23 MED FILL — NUVARING VAGINAL RING: 0.12-0.015 | 84 days supply | Qty: 3 | Fill #0

## 2016-05-25 MED FILL — NUVARING VAGINAL RING: 0.12-0.015 | 84 days supply | Qty: 3 | Fill #1

## 2016-08-15 MED FILL — NUVARING VAGINAL RING: 0.12-0.015 | 84 days supply | Qty: 3 | Fill #2

## 2016-11-07 MED FILL — NUVARING VAGINAL RING: 0.12-0.015 | 84 days supply | Qty: 3 | Fill #3

## 2016-12-28 ENCOUNTER — Encounter: Payer: Self-pay | Admitting: Nurse Practitioner

## 2016-12-28 ENCOUNTER — Ambulatory Visit (INDEPENDENT_AMBULATORY_CARE_PROVIDER_SITE_OTHER): Payer: 59 | Admitting: Nurse Practitioner

## 2016-12-28 VITALS — BP 118/68 | HR 80 | Resp 16 | Ht 71.0 in | Wt 228.0 lb

## 2016-12-28 DIAGNOSIS — Z113 Encounter for screening for infections with a predominantly sexual mode of transmission: Secondary | ICD-10-CM | POA: Diagnosis not present

## 2016-12-28 DIAGNOSIS — Z01419 Encounter for gynecological examination (general) (routine) without abnormal findings: Secondary | ICD-10-CM

## 2016-12-28 DIAGNOSIS — Z0000 Encounter for general adult medical examination without abnormal findings: Secondary | ICD-10-CM | POA: Diagnosis not present

## 2016-12-28 MED ORDER — ETONOGESTREL-ETHINYL ESTRADIOL 0.12-0.015 MG/24HR VA RING: VAGINAL_RING | VAGINAL | 4 refills | Status: DC

## 2016-12-28 NOTE — Patient Instructions (Signed)
General topics  Next pap or exam is  due in 1 year Take a Women's multivitamin Take 1200 mg. of calcium daily - prefer dietary If any concerns in interim to call back  Breast Self-Awareness Practicing breast self-awareness may pick up problems early, prevent significant medical complications, and possibly save your life. By practicing breast self-awareness, you can become familiar with how your breasts look and feel and if your breasts are changing. This allows you to notice changes early. It can also offer you some reassurance that your breast health is good. One way to learn what is normal for your breasts and whether your breasts are changing is to do a breast self-exam. If you find a lump or something that was not present in the past, it is best to contact your caregiver right away. Other findings that should be evaluated by your caregiver include nipple discharge, especially if it is bloody; skin changes or reddening; areas where the skin seems to be pulled in (retracted); or new lumps and bumps. Breast pain is seldom associated with cancer (malignancy), but should also be evaluated by a caregiver. BREAST SELF-EXAM The best time to examine your breasts is 5 7 days after your menstrual period is over.  ExitCare Patient Information 2013 [employer].   Exercise to Stay Healthy Exercise helps you become and stay healthy. EXERCISE IDEAS AND TIPS Choose exercises that:  You enjoy.  Fit into your day. You do not need to exercise really hard to be healthy. You can do exercises at a slow or medium level and stay healthy. You can:  Stretch before and after working out.  Try yoga, Pilates, or tai chi.  Lift weights.  Walk fast, swim, jog, run, climb stairs, bicycle, dance, or rollerskate.  Take aerobic classes. Exercises that burn about 150 calories:  Running 1  miles in 15 minutes.  Playing volleyball for 45 to 60 minutes.  Washing and waxing a car for 45 to 60  minutes.  Playing touch football for 45 minutes.  Walking 1  miles in 35 minutes.  Pushing a stroller 1  miles in 30 minutes.  Playing basketball for 30 minutes.  Raking leaves for 30 minutes.  Bicycling 5 miles in 30 minutes.  Walking 2 miles in 30 minutes.  Dancing for 30 minutes.  Shoveling snow for 15 minutes.  Swimming laps for 20 minutes.  Walking up stairs for 15 minutes.  Bicycling 4 miles in 15 minutes.  Gardening for 30 to 45 minutes.  Jumping rope for 15 minutes.  Washing windows or floors for 45 to 60 minutes. Document Released: 01/14/2011 Document Revised: 03/05/2012 Document Reviewed: 01/14/2011 ExitCare Patient Information 2013 [employer].   Other topics ( that may be useful information):    Sexually Transmitted Disease Sexually transmitted disease (STD) refers to any infection that is passed from person to person during sexual activity. This may happen by way of saliva, semen, blood, vaginal mucus, or urine. Common STDs include:  Gonorrhea.  Chlamydia.  Syphilis.  HIV/AIDS.  Genital herpes.  Hepatitis B and C.  Trichomonas.  Human papillomavirus (HPV).  Pubic lice. CAUSES  An STD may be spread by bacteria, virus, or parasite. A person can get an STD by:  Sexual intercourse with an infected person.  Sharing sex toys with an infected person.  Sharing needles with an infected person.  Having intimate contact with the genitals, mouth, or rectal areas of an infected person. SYMPTOMS  Some people may not have any symptoms, but   they can still pass the infection to others. Different STDs have different symptoms. Symptoms include:  Painful or bloody urination.  Pain in the pelvis, abdomen, vagina, anus, throat, or eyes.  Skin rash, itching, irritation, growths, or sores (lesions). These usually occur in the genital or anal area.  Abnormal vaginal discharge.  Penile discharge in men.  Soft, flesh-colored skin growths in the  genital or anal area.  Fever.  Pain or bleeding during sexual intercourse.  Swollen glands in the groin area.  Yellow skin and eyes (jaundice). This is seen with hepatitis. DIAGNOSIS  To make a diagnosis, your caregiver may:  Take a medical history.  Perform a physical exam.  Take a specimen (culture) to be examined.  Examine a sample of discharge under a microscope.  Perform blood test TREATMENT   Chlamydia, gonorrhea, trichomonas, and syphilis can be cured with antibiotic medicine.  Genital herpes, hepatitis, and HIV can be treated, but not cured, with prescribed medicines. The medicines will lessen the symptoms.  Genital warts from HPV can be treated with medicine or by freezing, burning (electrocautery), or surgery. Warts may come back.  HPV is a virus and cannot be cured with medicine or surgery.However, abnormal areas may be followed very closely by your caregiver and may be removed from the cervix, vagina, or vulva through office procedures or surgery. If your diagnosis is confirmed, your recent sexual partners need treatment. This is true even if they are symptom-free or have a negative culture or evaluation. They should not have sex until their caregiver says it is okay. HOME CARE INSTRUCTIONS  All sexual partners should be informed, tested, and treated for all STDs.  Take your antibiotics as directed. Finish them even if you start to feel better.  Only take over-the-counter or prescription medicines for pain, discomfort, or fever as directed by your caregiver.  Rest.  Eat a balanced diet and drink enough fluids to keep your urine clear or pale yellow.  Do not have sex until treatment is completed and you have followed up with your caregiver. STDs should be checked after treatment.  Keep all follow-up appointments, Pap tests, and blood tests as directed by your caregiver.  Only use latex condoms and water-soluble lubricants during sexual activity. Do not use  petroleum jelly or oils.  Avoid alcohol and illegal drugs.  Get vaccinated for HPV and hepatitis. If you have not received these vaccines in the past, talk to your caregiver about whether one or both might be right for you.  Avoid risky sex practices that can break the skin. The only way to avoid getting an STD is to avoid all sexual activity.Latex condoms and dental dams (for oral sex) will help lessen the risk of getting an STD, but will not completely eliminate the risk. SEEK MEDICAL CARE IF:   You have a fever.  You have any new or worsening symptoms. Document Released: 03/04/2003 Document Revised: 03/05/2012 Document Reviewed: 03/11/2011 [facility] Patient Information 2013 [employer].    Domestic Abuse You are being battered or abused if someone close to you hits, pushes, or physically hurts you in any way. You also are being abused if you are forced into activities. You are being sexually abused if you are forced to have sexual contact of any kind. You are being emotionally abused if you are made to feel worthless or if you are constantly threatened. It is important to remember that help is available. No one has the right to abuse you. PREVENTION OF FURTHER  ABUSE  Learn the warning signs of danger. This varies with situations but may include: the use of alcohol, threats, isolation from friends and family, or forced sexual contact. Leave if you feel that violence is going to occur.  If you are attacked or beaten, report it to the police so the abuse is documented. You do not have to press charges. The police can protect you while you or the attackers are leaving. Get the officer's name and badge number and a copy of the report.  Find someone you can trust and tell them what is happening to you: your caregiver, a nurse, clergy member, close friend or family member. Feeling ashamed is natural, but remember that you have done nothing wrong. No one deserves abuse. Document Released:  12/09/2000 Document Revised: 03/05/2012 Document Reviewed: 02/17/2011 ExitCare Patient Information 2013 [employer].    How Much is Too Much Alcohol? Drinking too much alcohol can cause injury, accidents, and health problems. These types of problems can include:   Car crashes.  Falls.  Family fighting (domestic violence).  Drowning.  Fights.  Injuries.  Burns.  Damage to certain organs.  Having a baby with birth defects. ONE DRINK CAN BE TOO MUCH WHEN YOU ARE:  Working.  Pregnant or breastfeeding.  Taking medicines. Ask your doctor.  Driving or planning to drive. If you or someone you know has a drinking problem, get help from a doctor.  Document Released: 10/08/2009 Document Revised: 03/05/2012 Document Reviewed: 10/08/2009 ExitCare Patient Information 2013 [employer].   Smoking Hazards Smoking cigarettes is extremely bad for your health. Tobacco smoke has over 200 known poisons in it. There are over 60 chemicals in tobacco smoke that cause cancer. Some of the chemicals found in cigarette smoke include:   Cyanide.  Benzene.  Formaldehyde.  Methanol (wood alcohol).  Acetylene (fuel used in welding torches).  Ammonia. Cigarette smoke also contains the poisonous gases nitrogen oxide and carbon monoxide.  Cigarette smokers have an increased risk of many serious medical problems and Smoking causes approximately:  90% of all lung cancer deaths in men.  80% of all lung cancer deaths in women.  90% of deaths from chronic obstructive lung disease. Compared with nonsmokers, smoking increases the risk of:  Coronary heart disease by 2 to 4 times.  Stroke by 2 to 4 times.  Men developing lung cancer by 23 times.  Women developing lung cancer by 13 times.  Dying from chronic obstructive lung diseases by 12 times.  . Smoking is the most preventable cause of death and disease in our society.  WHY IS SMOKING ADDICTIVE?  Nicotine is the chemical  agent in tobacco that is capable of causing addiction or dependence.  When you smoke and inhale, nicotine is absorbed rapidly into the bloodstream through your lungs. Nicotine absorbed through the lungs is capable of creating a powerful addiction. Both inhaled and non-inhaled nicotine may be addictive.  Addiction studies of cigarettes and spit tobacco show that addiction to nicotine occurs mainly during the teen years, when young people begin using tobacco products. WHAT ARE THE BENEFITS OF QUITTING?  There are many health benefits to quitting smoking.   Likelihood of developing cancer and heart disease decreases. Health improvements are seen almost immediately.  Blood pressure, pulse rate, and breathing patterns start returning to normal soon after quitting. QUITTING SMOKING   American Lung Association - 1-800-LUNGUSA  American Cancer Society - 1-800-ACS-2345 Document Released: 01/19/2005 Document Revised: 03/05/2012 Document Reviewed: 09/23/2009 ExitCare Patient Information 2013 [employer].   Stress Management Stress is a state of physical or mental tension that often results from changes in your life or normal routine. Some common causes of stress are:  Death of a loved one.  Injuries or severe illnesses.  Getting fired or changing jobs.  Moving into a new home. Other causes may be:  Sexual problems.  Business or financial losses.  Taking on a large debt.  Regular conflict with someone at home or at work.  Constant tiredness from lack of sleep. It is not just bad things that are stressful. It may be stressful to:  Win the lottery.  Get married.  Buy a new car. The amount of stress that can be easily tolerated varies from person to person. Changes generally cause stress, regardless of the types of change. Too much stress can affect your health. It may lead to physical or emotional problems. Too little stress (boredom) may also become stressful. SUGGESTIONS TO  REDUCE STRESS:  Talk things over with your family and friends. It often is helpful to share your concerns and worries. If you feel your problem is serious, you may want to get help from a professional counselor.  Consider your problems one at a time instead of lumping them all together. Trying to take care of everything at once may seem impossible. List all the things you need to do and then start with the most important one. Set a goal to accomplish 2 or 3 things each day. If you expect to do too many in a single day you will naturally fail, causing you to feel even more stressed.  Do not use alcohol or drugs to relieve stress. Although you may feel better for a short time, they do not remove the problems that caused the stress. They can also be habit forming.  Exercise regularly - at least 3 times per week. Physical exercise can help to relieve that "uptight" feeling and will relax you.  The shortest distance between despair and hope is often a good night's sleep.  Go to bed and get up on time allowing yourself time for appointments without being rushed.  Take a short "time-out" period from any stressful situation that occurs during the day. Close your eyes and take some deep breaths. Starting with the muscles in your face, tense them, hold it for a few seconds, then relax. Repeat this with the muscles in your neck, shoulders, hand, stomach, back and legs.  Take good care of yourself. Eat a balanced diet and get plenty of rest.  Schedule time for having fun. Take a break from your daily routine to relax. HOME CARE INSTRUCTIONS   Call if you feel overwhelmed by your problems and feel you can no longer manage them on your own.  Return immediately if you feel like hurting yourself or someone else. Document Released: 06/07/2001 Document Revised: 03/05/2012 Document Reviewed: 01/28/2008 ExitCare Patient Information 2013 [employer].  

## 2016-12-28 NOTE — Progress Notes (Signed)
Patient ID: Madison Cooper, female   DOB: Aug 07, 1991, 26 y.o.   MRN: [id number]  26 y.o. [id number] Single  Caucasian Fe here for annual exam.  Menses x 4-5 days.  Flow moderate to light.  No cramps, some PMS. New partner for 6 weeks. Request STD's.  Still in nursing school.  Patient's last menstrual period was 12/24/2016.          Sexually active: Yes.    The current method of family planning is condoms sometimes and Nuva Ring vaginal inserts.  Same partner x 5 years. Exercising: Yes.    cardio/weights Smoker:  no  Health Maintenance: Pap: 12/04/14, Negative TDaP: UTD Gardasil: completed in 2006 HIV: 12/07/15 Labs: blood drawn. Patient would like STD screening   reports that she has never smoked. She has never used smokeless tobacco. She reports that she does not drink alcohol or use drugs.  History reviewed. No pertinent past medical history.  Past Surgical History:  Procedure Laterality Date  . ANKLE SURGERY Left 2010  . MANDIBLE SURGERY  11/15/12  . TONSILLECTOMY AND ADENOIDECTOMY  1/12  . WISDOM TOOTH EXTRACTION  12/11    Current Outpatient Prescriptions  Medication Sig Dispense Refill  . etonogestrel-ethinyl estradiol (NUVARING) 0.12-0.015 MG/24HR vaginal ring Use for 3 weeks, then remove for 1 week. 3 each 4  . cetirizine (ZYRTEC) 10 MG tablet Take 1 tablet (10 mg total) by mouth daily. (Patient not taking: Reported on 12/28/2016) 30 tablet 0  . fluticasone (FLONASE) 50 MCG/ACT nasal spray Place 2 sprays into both nostrils daily. (Patient not taking: Reported on 12/28/2016) 16 g 0   No current facility-administered medications for this visit.     Family History  Problem Relation Age of Onset  . Heart disease Maternal Grandfather   . Diabetes Maternal Grandfather   . Heart disease Paternal Grandfather   . Diabetes Paternal Grandfather     ROS:  Pertinent items are noted in HPI.  Otherwise, a comprehensive ROS was negative.  Exam:   BP 118/68 (BP Location: Right Arm,  Patient Position: Sitting, Cuff Size: Normal)   Pulse 80   Resp 16   Ht 5\' 11"  (1.803 m)   Wt 228 lb (103.4 kg)   LMP 12/24/2016   BMI 31.80 kg/m  Height: 5\' 11"  (180.3 cm) Ht Readings from Last 3 Encounters:  12/28/16 5\' 11"  (1.803 m)  12/07/15 5\' 10"  (1.778 m)  12/04/14 5' 10.5" (1.791 m)    General appearance: alert, cooperative and appears stated age Head: Normocephalic, without obvious abnormality, atraumatic Neck: no adenopathy, supple, symmetrical, trachea midline and thyroid normal to inspection and palpation Lungs: clear to auscultation bilaterally Breasts: normal appearance, no masses or tenderness Heart: regular rate and rhythm Abdomen: soft, non-tender; no masses,  no organomegaly Extremities: extremities normal, atraumatic, no cyanosis or edema Skin: Skin color, texture, turgor normal. No rashes or lesions Lymph nodes: Cervical, supraclavicular, and axillary nodes normal. No abnormal inguinal nodes palpated Neurologic: Grossly normal   Pelvic: External genitalia:  no lesions              Urethra:  normal appearing urethra with no masses, tenderness or lesions              Bartholin's and Skene's: normal                 Vagina: normal appearing vagina with normal color and discharge, no lesions              Cervix: anteverted  Pap taken: Yes.   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

## 2016-12-29 LAB — WET PREP BY MOLECULAR PROBE
Candida species: NEGATIVE
Gardnerella vaginalis: NEGATIVE
Trichomonas vaginosis: NEGATIVE

## 2016-12-29 LAB — STD PANEL
HIV 1&2 Ab, 4th Generation: NONREACTIVE
Hepatitis B Surface Ag: NEGATIVE

## 2016-12-29 LAB — IPS PAP TEST WITH REFLEX TO HPV

## 2016-12-30 LAB — GC/CHLAMYDIA PROBE AMP
CT Probe RNA: NOT DETECTED
GC Probe RNA: NOT DETECTED

## 2017-01-01 NOTE — Progress Notes (Signed)
Encounter reviewed by Dr. Kessa Fairbairn Amundson C. Silva.  

## 2017-03-02 ENCOUNTER — Other Ambulatory Visit: Payer: Self-pay | Admitting: Orthopedic Surgery

## 2017-03-02 DIAGNOSIS — Q762 Congenital spondylolisthesis: Secondary | ICD-10-CM

## 2017-03-06 ENCOUNTER — Other Ambulatory Visit: Payer: 59

## 2017-03-08 ENCOUNTER — Encounter: Payer: Self-pay | Admitting: Physical Therapy

## 2017-03-08 ENCOUNTER — Ambulatory Visit
Admission: RE | Admit: 2017-03-08 | Discharge: 2017-03-08 | Disposition: A | Discharge status: Home / Self Care | Payer: 59 | Source: Ambulatory Visit | Attending: Orthopedic Surgery | Admitting: Orthopedic Surgery

## 2017-03-08 ENCOUNTER — Ambulatory Visit: Payer: 59 | Attending: Orthopedic Surgery | Admitting: Physical Therapy

## 2017-03-08 DIAGNOSIS — G8929 Other chronic pain: Secondary | ICD-10-CM | POA: Insufficient documentation

## 2017-03-08 DIAGNOSIS — M62830 Muscle spasm of back: Secondary | ICD-10-CM | POA: Diagnosis present

## 2017-03-08 DIAGNOSIS — R293 Abnormal posture: Secondary | ICD-10-CM | POA: Insufficient documentation

## 2017-03-08 DIAGNOSIS — M545 Low back pain: Secondary | ICD-10-CM | POA: Diagnosis not present

## 2017-03-08 DIAGNOSIS — Q762 Congenital spondylolisthesis: Secondary | ICD-10-CM

## 2017-03-08 NOTE — Therapy (Signed)
[facility] [address] [place], [place], [address] Phone: [phone]   Fax:  [phone]  Physical Therapy Evaluation  Patient Details  Name: Madison Cooper MRN: [id number] Date of Birth: 01-Jun-1991 Referring Provider: Patricia Nettle MD  Encounter Date: 03/08/2017      PT End of Session - 03/08/17 1613    Visit Number 1   Number of Visits 13   Date for PT Re-Evaluation 04/19/17   PT Start Time 1500   PT Stop Time 1545   PT Time Calculation (min) 45 min   Activity Tolerance Patient tolerated treatment well   Behavior During Therapy [facility] for tasks assessed/performed      Past Medical History:  Diagnosis Date  . Allergy     Past Surgical History:  Procedure Laterality Date  . ANKLE SURGERY Left 2010  . MANDIBLE SURGERY  11/15/12  . TONSILLECTOMY AND ADENOIDECTOMY  1/12  . WISDOM TOOTH EXTRACTION  12/11    There were no vitals filed for this visit.       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 Denies Numbnes/ tingling in the LE, most of the pain only stays in the back. denies bowel or bladder issues or saddle paresthesia.    Limitations Standing;Sitting;Walking   How long can you sit comfortably? 20-30 min   How long can you stand comfortably? 1 hour   How long can you walk comfortably? 15 min   Diagnostic tests MRI    Patient Stated Goals figure out how to make the pain go away, posture, to be able to go to the gym.    Currently in Pain? Yes   Pain Score 7   at worst 10/10   Pain Location Back   Pain Orientation Right   Pain Descriptors / Indicators Sharp;Stabbing;Burning;Dull;Aching   Pain Type Chronic pain   Pain Onset More than a month ago   Pain Frequency Constant    Aggravating Factors  bending forward, Turning, leaning backward,    Pain Relieving Factors leaning to the L to stretch the R low back, heat / ice with minimal relief            [facility] PT Assessment - 03/08/17 1456      Assessment   Medical Diagnosis Congenital Spondylolisthesis   Referring Provider Patricia Nettle MD   Onset Date/Surgical Date --  2012   Hand Dominance Right   Next MD Visit 03/10/2017   Prior Therapy no     Precautions   Precautions None   Precaution Comments no lifting over 40#     Restrictions   Weight Bearing Restrictions No     Balance Screen   Has the patient fallen in the past 6 months No   Has the patient had a decrease in activity level because of a fear of falling?  No   Is the patient reluctant to leave their home because of a fear of falling?  No     Home Tourist information centre manager residence   Research officer, trade union;Other relatives   Available Help at Discharge Available PRN/intermittently;Family   Type of Home House   Home Access Stairs to enter   Entrance Stairs-Number  of Steps 4   Entrance Stairs-Rails Right   Home Layout One level     Prior Function   Level of Independence Independent;Independent with basic ADLs   Vocation Part time employment;Student  CNA and nursing school   Vocation Requirements lifting/ pulling, pushing, squating, bending,    Leisure exercising, hiking, concerts, movies     Cognition   Overall Cognitive Status Within Functional Limits for tasks assessed     Observation/Other Assessments   Focus on Therapeutic Outcomes (FOTO)  55% limited  predicted 34% limited     Posture/Postural Control   Posture/Postural Control Postural limitations   Postural Limitations Rounded Shoulders;Forward head;Decreased thoracic kyphosis;Increased lumbar lordosis     ROM / Strength   AROM / PROM / Strength AROM;Strength     AROM   AROM Assessment Site Lumbar   Lumbar Flexion 40  ERP   Lumbar Extension 30   ERP   Lumbar - Right Side Bend 20   Lumbar - Left Side Bend 20     Strength   Strength Assessment Site Hip;Knee   Right/Left Hip Right;Left   Right Hip Flexion 5/5   Right Hip Extension 4/5   Right Hip ABduction 4-/5   Right Hip ADduction 5/5   Left Hip Flexion 5/5   Left Hip Extension 4/5   Left Hip ABduction 4/5   Left Hip ADduction 5/5   Right/Left Knee Right;Left   Right Knee Flexion 5/5   Right Knee Extension 5/5   Left Knee Flexion 5/5   Left Knee Extension 5/5     Palpation   Palpation comment pain in the R PSIS, some tightness in the R lumbar paraspinals     Special Tests    Special Tests Lumbar;Sacrolliac Tests   Lumbar Tests Slump Test;Prone Knee Bend Test;Straight Leg Raise   Sacroiliac Tests  Gaenslen's Test  forward flexion test, Gil[place] test     Slump test   Findings Negative     Prone Knee Bend Test   Findings Negative     Straight Leg Raise   Findings Positive   Side  Right   Comment pain noted in the R SIJ      Sacral thrust    Findings Positive   Side Right     Gaenslen's test   Findings Positive   Side  Right                   [facility] Adult PT Treatment/Exercise - 03/08/17 1456      Knee/Hip Exercises: Stretches   Active Hamstring Stretch 3 reps;30 seconds  with 10 second contraction    Other Knee/Hip Stretches lower trunk rotation 2 x 30 sec each     Knee/Hip Exercises: Supine   Straight Leg Raises 15 reps     Manual Therapy   Manual Therapy Muscle Energy Technique   Muscle Energy Technique scissoring technique with resisted R hip flexion and L hamstring curl 5 x 10 sec hold                PT Education - 03/08/17 1611    Education provided Yes   Education Details evaluation findings, POC, goals, HEP  with form/ rationale, anatomy of the SIJ and the effects of the muscles on the innominate   Person(s) Educated Patient   Methods Explanation;Verbal cues;Handout   Comprehension Verbalized understanding;Verbal  cues required          PT Short Term Goals - 03/08/17 1651  PT SHORT TERM GOAL #1   Title pt will be I with inital HEP (03/29/2017)   Time 4   Period Weeks   Status New     PT SHORT TERM GOAL #2   Title she will demo proper posture/ lifting and carrying mechanics to prevent and reduce low back pain (03/29/2017)   Time 4   Period Weeks   Status New     PT SHORT TERM GOAL #3   Title she will demo decreased muscle tightness in the back to reduce pain to </=5/10 and promote trunk mobility (03/29/2017)   Time 3   Period Weeks   Status New           PT Long Term Goals - 03/08/17 1700      PT LONG TERM GOAL #1   Title pt will be I with all HEP given as of last visit (04/19/2017)   Time 6   Period Weeks   Status New     PT LONG TERM GOAL #2   Title she will increase bil hip abductor/ extensor strength to >/= 4+/5 to assist with walking/ standing and assist with lifting for work related tasks (04/19/2017)   Time 6   Period Weeks   Status New     PT LONG TERM GOAL #3   Title she will increase trunk flexion/ bil side bending by >/= 10 degrees to assist with ADLS and work tasks (04/19/2017)   Time 6   Period Weeks   Status New     PT LONG TERM GOAL #4   Title pt will improve her FOTO to </= 34% limited to demonstrate increase in function (04/19/2017)   Time 6   Period Weeks   Status New               Plan - 03/08/17 1615    Clinical Impression Statement Mrs. Andrey Campanile presents to OPPT as a low complexity evaluation regarding CC of low back pain. she demonstrates limited trunk mobility with end range pain noted. She had functional strength in all hip motion except for some weakness with hip abductors/ extensors. She demosntrates decreased thoracic kyphosis and increaesd lumbar lordosis. special testing is consistent for SIJ involvement exhibiting a posterior innominate rotation. she would benefit from physical therapy to decrease low back pain, improve mobility and  decrease muscle tightness and return her to PLOF by addressing the deficits listed   Rehab Potential Good   PT Frequency 2x / week   PT Duration 6 weeks   PT Treatment/Interventions ADLs/Self Care Home Management;Electrical Stimulation;Moist Heat;Ultrasound;Iontophoresis 4mg /ml Dexamethasone;Traction;Therapeutic activities;Therapeutic exercise;Patient/family education;Dry needling;Manual techniques;Cryotherapy;Taping   PT Next Visit Plan assess/ review HEP, manual for lumbar paraspinals, posterior rotation of innominate on R, hip strengthening   PT Home Exercise Plan hamstring stretch, SLR, lower trunk rotation, Dead bug   Consulted and Agree with Plan of Care Patient      Patient will benefit from skilled therapeutic intervention in order to improve the following deficits and impairments:  Pain, Improper body mechanics, Postural dysfunction, Decreased endurance, Decreased activity tolerance, Decreased balance, Decreased range of motion, Increased muscle spasms, Decreased strength, Increased fascial restricitons  Visit Diagnosis: Chronic right-sided low back pain without sciatica - Plan: PT plan of care cert/re-cert  Abnormal posture - Plan: PT plan of care cert/re-cert  Muscle spasm of back - Plan: PT plan of care cert/re-cert     Problem List There are no active problems to display for this patient.  Perina Salvaggio PT,  DPT, LAT, ATC  03/08/17  5:10 PM      [facility] [address] [place], [place], [address] Phone: [phone]   Fax:  [phone]  Name: DREMA EDDINGTON MRN: [id number] Date of Birth: December 25, 1991

## 2017-03-15 ENCOUNTER — Encounter: Payer: Self-pay | Admitting: Physical Therapy

## 2017-03-15 ENCOUNTER — Ambulatory Visit: Payer: 59 | Admitting: Physical Therapy

## 2017-03-15 DIAGNOSIS — R293 Abnormal posture: Secondary | ICD-10-CM

## 2017-03-15 DIAGNOSIS — M62830 Muscle spasm of back: Secondary | ICD-10-CM

## 2017-03-15 DIAGNOSIS — G8929 Other chronic pain: Secondary | ICD-10-CM

## 2017-03-15 DIAGNOSIS — M545 Low back pain: Secondary | ICD-10-CM | POA: Diagnosis not present

## 2017-03-15 NOTE — Therapy (Signed)
[place], [place], [address] Phone: [phone]   Fax:  [phone]  Physical Therapy Treatment  Patient Details  Name: Madison Cooper MRN: [id number] Date of Birth: 06-13-1991 Referring Provider: Starling Manns MD  Encounter Date: 03/15/2017      PT End of Session - 03/15/17 1402    Visit Number 2   Number of Visits 13   Date for PT Re-Evaluation 04/19/17   PT Start Time 3953   PT Stop Time 1446   PT Time Calculation (min) 43 min   Activity Tolerance Patient tolerated treatment well   Behavior During Therapy [facility] for tasks assessed/performed      Past Medical History:  Diagnosis Date  . Allergy     Past Surgical History:  Procedure Laterality Date  . ANKLE SURGERY Left 2010  . MANDIBLE SURGERY  11/15/12  . TONSILLECTOMY AND ADENOIDECTOMY  1/12  . WISDOM TOOTH EXTRACTION  12/11    There were no vitals filed for this visit.      Subjective Assessment - 03/15/17 1404    Subjective "doing alittle today, being mostly consistent with the HEP"    Currently in Pain? Yes   Pain Score 5    Pain Location Back   Pain Orientation Right   Pain Descriptors / Indicators Aching;Sore   Pain Onset More than a month ago   Pain Frequency Constant   Aggravating Factors  moving, lifting, carrying, prolonged sitting,                          [facility] Adult PT Treatment/Exercise - 03/15/17 1409      Lumbar Exercises: Stretches   Pelvic Tilt --  1x  10 with 5 sec hold   Active Hamstring Stretch 4 reps;30 seconds  contract/ relax with 10 sec hold     Lumbar Exercises: Seated   Other Seated Lumbar Exercises pelvic tilt 1 x 10 with 3 sec hold,   verbal cues to avoid extreme end ranges and for ADIM     Lumbar Exercises: Supine   Bent Knee Raise 20 reps;1 second  verbal cues for ADIM.   Other Supine Lumbar Exercises alternating marching with ADIM 1 x 20     Knee/Hip Exercises: Aerobic   Nustep L7 x 6 min  UE/LE     Knee/Hip Exercises: Supine   Straight Leg Raises Strengthening;2 sets;15 reps;Both   Straight Leg Raises Limitations 3#     Manual Therapy   Manual Therapy Joint mobilization   Manual therapy comments manual trigger point release over the R lumbar paraspinals   Joint Mobilization R LLE long axis distraction Grade 5   Muscle Energy Technique MET: L sidelying resisted R hip flexion and Grade 2-3 R innominate anterior mob                PT Education - 03/15/17 1449    Education provided Yes   Education Details updated HEP for seated pelvic tilt for posture. How to palpate for Transverse abdominus and what a proper contraction should feel like.    Person(s) Educated Patient   Methods Explanation;Verbal cues   Comprehension Verbalized understanding;Verbal cues required          PT Short Term Goals - 03/08/17 1651      PT SHORT TERM GOAL #1   Title pt will be I with inital HEP (03/29/2017)   Time 4   Period Weeks  Status New     PT SHORT TERM GOAL #2   Title she will demo proper posture/ lifting and carrying mechanics to prevent and reduce low back pain (03/29/2017)   Time 4   Period Weeks   Status New     PT SHORT TERM GOAL #3   Title she will demo decreased muscle tightness in the back to reduce pain to </=5/10 and promote trunk mobility (03/29/2017)   Time 3   Period Weeks   Status New           PT Long Term Goals - 03/08/17 1700      PT LONG TERM GOAL #1   Title pt will be I with all HEP given as of last visit (04/19/2017)   Time 6   Period Weeks   Status New     PT LONG TERM GOAL #2   Title she will increase bil hip abductor/ extensor strength to >/= 4+/5 to assist with walking/ standing and assist with lifting for work related tasks (04/19/2017)   Time 6   Period Weeks   Status New     PT LONG TERM GOAL #3   Title she will increase trunk flexion/ bil side bending by >/= 10 degrees to assist with ADLS and work tasks  (04/19/2017)   Time 6   Period Weeks   Status New     PT LONG TERM GOAL #4   Title pt will improve her FOTO to </= 34% limited to demonstrate increase in function (04/19/2017)   Time 6   Period Weeks   Status New               Plan - 03/15/17 1451    Clinical Impression Statement pt reports being consistent with HEP and demonstrates decreased pain compared to evaluation today. Continued MET techinque with resisted R hip flexion. worked on core strengthening and seated posture. She performed all exercisese well and reported some reduction in pain at end session.    PT Next Visit Plan progress core strengthening, manual for lumbar paraspinals, posterior rotation of innominate on R, hip strengthening, DN?   PT Home Exercise Plan hamstring stretch, SLR, lower trunk rotation, Dead bug, seated pelvic tilt   Consulted and Agree with Plan of Care Patient      Patient will benefit from skilled therapeutic intervention in order to improve the following deficits and impairments:  Pain, Improper body mechanics, Postural dysfunction, Decreased endurance, Decreased activity tolerance, Decreased balance, Decreased range of motion, Increased muscle spasms, Decreased strength, Increased fascial restricitons  Visit Diagnosis: Chronic right-sided low back pain without sciatica  Abnormal posture  Muscle spasm of back     Problem List There are no active problems to display for this patient.  Starr Lake PT, DPT, LAT, ATC  03/15/17  2:56 PM      [facility] [address] [place], [place], [address] Phone: [phone]   Fax:  [phone]  Name: Madison Cooper MRN: [id number] Date of Birth: 1991/06/16

## 2017-03-17 ENCOUNTER — Ambulatory Visit: Payer: 59 | Admitting: Physical Therapy

## 2017-03-17 DIAGNOSIS — M62830 Muscle spasm of back: Secondary | ICD-10-CM

## 2017-03-17 DIAGNOSIS — R293 Abnormal posture: Secondary | ICD-10-CM

## 2017-03-17 DIAGNOSIS — G8929 Other chronic pain: Secondary | ICD-10-CM

## 2017-03-17 DIAGNOSIS — M545 Low back pain: Secondary | ICD-10-CM

## 2017-03-17 NOTE — Therapy (Signed)
[place], [place], [address] Phone: [phone]   Fax:  [phone]  Physical Therapy Treatment  Patient Details  Name: Madison Cooper MRN: [id number] Date of Birth: 1991/06/01 Referring Provider: Starling Manns MD  Encounter Date: 03/17/2017      PT End of Session - 03/17/17 0937    Visit Number 3   Number of Visits 13   Date for PT Re-Evaluation 04/19/17   PT Start Time 0930   PT Stop Time 1011   PT Time Calculation (min) 41 min   Activity Tolerance Patient tolerated treatment well   Behavior During Therapy [facility] for tasks assessed/performed      Past Medical History:  Diagnosis Date  . Allergy     Past Surgical History:  Procedure Laterality Date  . ANKLE SURGERY Left 2010  . MANDIBLE SURGERY  11/15/12  . TONSILLECTOMY AND ADENOIDECTOMY  1/12  . WISDOM TOOTH EXTRACTION  12/11    There were no vitals filed for this visit.      Subjective Assessment - 03/17/17 0936    Currently in Pain? Yes   Pain Score 3    Pain Location Back   Pain Orientation Left                         [facility] Adult PT Treatment/Exercise - 03/17/17 0001      Lumbar Exercises: Supine   Ab Set 10 reps   Clam Limitations isometrics into belt 5 sec x 10    Other Supine Lumbar Exercises ball squeeze with ab draw in      Knee/Hip Exercises: Stretches   Active Hamstring Stretch 2 reps;30 seconds   Other Knee/Hip Stretches 10 sec x 4 each way     Knee/Hip Exercises: Aerobic   Nustep L5 x 6 min LE only      Knee/Hip Exercises: Sidelying   Clams x20 each      Manual Therapy   Muscle Energy Technique MET: R sidelying resisted L hip flexion and Grade 2-3 R innominate anterior mob                PT Education - 03/17/17 1005    Education provided Yes   Education Details HEP   Person(s) Educated Patient   Methods Explanation;Handout   Comprehension Verbalized understanding           PT Short Term Goals - 03/17/17 0944      PT SHORT TERM GOAL #1   Title pt will be I with inital HEP (03/29/2017)   Time 4   Period Weeks   Status On-going     PT SHORT TERM GOAL #2   Title she will demo proper posture/ lifting and carrying mechanics to prevent and reduce low back pain (03/29/2017)   Baseline able return demo proper lift of stool, painful    Time 4   Period Weeks   Status On-going     PT SHORT TERM GOAL #3   Title she will demo decreased muscle tightness in the back to reduce pain to </=5/10 and promote trunk mobility (03/29/2017)   Baseline 3/10 today, has not worked in 6 days    Time 3   Period Weeks   Status Partially Met           PT Long Term Goals - 03/08/17 1700      PT LONG TERM GOAL #1   Title pt will be I  with all HEP given as of last visit (04/19/2017)   Time 6   Period Weeks   Status New     PT LONG TERM GOAL #2   Title she will increase bil hip abductor/ extensor strength to >/= 4+/5 to assist with walking/ standing and assist with lifting for work related tasks (04/19/2017)   Time 6   Period Weeks   Status New     PT LONG TERM GOAL #3   Title she will increase trunk flexion/ bil side bending by >/= 10 degrees to assist with ADLS and work tasks (6/80/3212)   Time 6   Period Weeks   Status New     PT LONG TERM GOAL #4   Title pt will improve her FOTO to </= 34% limited to demonstrate increase in function (04/19/2017)   Time 6   Period Weeks   Status New               Plan - 03/17/17 2482    Clinical Impression Statement Pt presents today with pain on left side. She reports it is usually right side however after last treatment, it moved to the left. MET used with resisted L hip flexion to level pelvis. Began SI stabilization exercises after level pelvis as well as hip sbduction strength. Updated HEP to include clam. At end of session pain more central/left.    PT Next Visit Plan progress core strengthening, manual for lumbar  paraspinals, posterior rotation of innominate on R/L depending on pelvis, hip strengthening, DN?   PT Home Exercise Plan hamstring stretch, SLR, lower trunk rotation, Dead bug, seated pelvic tilt   Consulted and Agree with Plan of Care Patient      Patient will benefit from skilled therapeutic intervention in order to improve the following deficits and impairments:  Pain, Improper body mechanics, Postural dysfunction, Decreased endurance, Decreased activity tolerance, Decreased balance, Decreased range of motion, Increased muscle spasms, Decreased strength, Increased fascial restricitons  Visit Diagnosis: Abnormal posture  Muscle spasm of back  Chronic right-sided low back pain without sciatica     Problem List There are no active problems to display for this patient.   Hessie Diener [place], [place] 03/17/2017, 10:13 AM  [facility] [address] [place], [place], [address] Phone: [phone]   Fax:  [phone]  Name: ALEAH AHLGRIM MRN: [id number] Date of Birth: 29-Jul-1991

## 2017-03-17 NOTE — Patient Instructions (Signed)
Abduction: Clam (Eccentric) - Side-Lying    Lie on side with knees bent. Lift top knee, keeping feet together. Keep trunk steady. Slowly lower for 3-5 seconds. _20__ reps per set, _2__ sets per day, _7__ days per week.  

## 2017-03-20 ENCOUNTER — Ambulatory Visit: Payer: 59 | Admitting: Physical Therapy

## 2017-03-20 ENCOUNTER — Encounter: Payer: Self-pay | Admitting: Physical Therapy

## 2017-03-20 DIAGNOSIS — M545 Low back pain, unspecified: Secondary | ICD-10-CM

## 2017-03-20 DIAGNOSIS — M62830 Muscle spasm of back: Secondary | ICD-10-CM

## 2017-03-20 DIAGNOSIS — G8929 Other chronic pain: Secondary | ICD-10-CM

## 2017-03-20 DIAGNOSIS — R293 Abnormal posture: Secondary | ICD-10-CM

## 2017-03-20 NOTE — Therapy (Signed)
[place], [place], [address] Phone: [phone]   Fax:  [phone]  Physical Therapy Treatment  Patient Details  Name: Madison Cooper MRN: [id number] Date of Birth: 1991/07/10 Referring Provider: Starling Manns MD  Encounter Date: 03/20/2017      PT End of Session - 03/20/17 1500    Visit Number 4   Number of Visits 13   Date for PT Re-Evaluation 04/19/17   PT Start Time 8676   PT Stop Time 1458   PT Time Calculation (min) 43 min   Activity Tolerance Patient tolerated treatment well   Behavior During Therapy [facility] for tasks assessed/performed      Past Medical History:  Diagnosis Date  . Allergy     Past Surgical History:  Procedure Laterality Date  . ANKLE SURGERY Left 2010  . MANDIBLE SURGERY  11/15/12  . TONSILLECTOMY AND ADENOIDECTOMY  1/12  . WISDOM TOOTH EXTRACTION  12/11    There were no vitals filed for this visit.      Subjective Assessment - 03/20/17 1420    Subjective "I have been doing pretty good, today I did notice some pain and walking helped"    Currently in Pain? Yes   Pain Score 5    Pain Location Back   Pain Orientation Left   Pain Descriptors / Indicators Sore   Pain Type Chronic pain   Pain Onset More than a month ago   Pain Frequency Constant                         [facility] Adult PT Treatment/Exercise - 03/20/17 0001      Lumbar Exercises: Stretches   Pelvic Tilt --  2 x 10 with 5 sec hold   Quadruped Mid Back Stretch 2 reps;30 seconds  childs pose position     Lumbar Exercises: Seated   Other Seated Lumbar Exercises pelvic tilt 1 x 10 with 3 sec hold, seated on green physioball marching with contralateral UE/ LE movement   cues to keep core tight     Knee/Hip Exercises: Stretches   Active Hamstring Stretch 3 reps;30 seconds  contract/ relax with 10 sec contraction     Knee/Hip Exercises: Supine   Other Supine Knee/Hip Exercises dead alt  UE/LE 1 x 5  attempted 2 sets but pt resisted to 2 sets     Knee/Hip Exercises: Sidelying   Clams x20 each      Manual Therapy   Manual Therapy Myofascial release;Soft tissue mobilization   Manual therapy comments manual trigger point release over the R lumbar paraspinals   Joint Mobilization R LLE long axis distraction Grade 5   Soft tissue mobilization IASTM over the R lumbar paraspinals   Myofascial Release stretching/ rolling over the R lumbar paraspinals   Muscle Energy Technique MET: Supine resisted R hip flexion and  L hip extension  given as HEP          Trigger Point Dry Needling - 03/20/17 1423    Consent Given? Yes   Education Handout Provided Yes   Muscles Treated Upper Body Longissimus   Longissimus Response Twitch response elicited;Palpable increased muscle length  R L5              PT Education - 03/20/17 1500    Education provided Yes   Education Details innominate rotation correction techniques   Person(s) Educated Patient   Methods Explanation;Verbal cues;Handout   Comprehension  Verbalized understanding;Verbal cues required          PT Short Term Goals - 03/17/17 0944      PT SHORT TERM GOAL #1   Title pt will be I with inital HEP (03/29/2017)   Time 4   Period Weeks   Status On-going     PT SHORT TERM GOAL #2   Title she will demo proper posture/ lifting and carrying mechanics to prevent and reduce low back pain (03/29/2017)   Baseline able return demo proper lift of stool, painful    Time 4   Period Weeks   Status On-going     PT SHORT TERM GOAL #3   Title she will demo decreased muscle tightness in the back to reduce pain to </=5/10 and promote trunk mobility (03/29/2017)   Baseline 3/10 today, has not worked in 6 days    Time 3   Period Weeks   Status Partially Met           PT Long Term Goals - 03/08/17 1700      PT LONG TERM GOAL #1   Title pt will be I with all HEP given as of last visit (04/19/2017)   Time 6   Period  Weeks   Status New     PT LONG TERM GOAL #2   Title she will increase bil hip abductor/ extensor strength to >/= 4+/5 to assist with walking/ standing and assist with lifting for work related tasks (04/19/2017)   Time 6   Period Weeks   Status New     PT LONG TERM GOAL #3   Title she will increase trunk flexion/ bil side bending by >/= 10 degrees to assist with ADLS and work tasks (01/27/3342)   Time 6   Period Weeks   Status New     PT LONG TERM GOAL #4   Title pt will improve her FOTO to </= 34% limited to demonstrate increase in function (04/19/2017)   Time 6   Period Weeks   Status New               Plan - 03/20/17 1501    Clinical Impression Statement pt reports decreased pain in the R low back with reoccurence with prolonged sitting. TPDN was educated and performed on the R L5 multifidus. Soft tissue work was performed to calm down muscle tightness/ soreness. continued MET which was given as HEP. continued core work which she was resistant to today.  she reported some soreness post session but declined modalities post session.   PT Next Visit Plan assess response to DN, progress core strengthening, manual for lumbar paraspinals, posterior rotation of innominate on R/L depending on pelvis, hip strengthening, Pilates/ reformer   PT Home Exercise Plan hamstring stretch, SLR, lower trunk rotation, Dead bug, seated pelvic tilt, innominate rotation scissor technique   Consulted and Agree with Plan of Care Patient      Patient will benefit from skilled therapeutic intervention in order to improve the following deficits and impairments:  Pain, Improper body mechanics, Postural dysfunction, Decreased endurance, Decreased activity tolerance, Decreased balance, Decreased range of motion, Increased muscle spasms, Decreased strength, Increased fascial restricitons  Visit Diagnosis: Muscle spasm of back  Chronic right-sided low back pain without sciatica  Abnormal  posture     Problem List There are no active problems to display for this patient.  Starr Lake PT, DPT, LAT, ATC  03/20/17  3:13 PM      [facility] [address] [place], [place], [address] Phone: [phone]   Fax:  [phone]  Name: Madison Cooper MRN: [id number] Date of Birth: 02-11-1991

## 2017-03-22 ENCOUNTER — Ambulatory Visit: Payer: 59 | Admitting: Physical Therapy

## 2017-03-22 DIAGNOSIS — R293 Abnormal posture: Secondary | ICD-10-CM

## 2017-03-22 DIAGNOSIS — M62830 Muscle spasm of back: Secondary | ICD-10-CM

## 2017-03-22 DIAGNOSIS — M545 Low back pain: Secondary | ICD-10-CM

## 2017-03-22 DIAGNOSIS — G8929 Other chronic pain: Secondary | ICD-10-CM

## 2017-03-22 NOTE — Therapy (Addendum)
[place], [place], [address] Phone: [phone]   Fax:  [phone]  Physical Therapy Treatment / discharge Summary  Patient Details  Name: Madison Cooper MRN: [id number] Date of Birth: 1991-05-10 Referring Provider: Starling Manns MD  Encounter Date: 03/22/2017      PT End of Session - 03/22/17 1327    Visit Number 5   Number of Visits 13   Date for PT Re-Evaluation 04/19/17   PT Start Time 1324   PT Stop Time 1330   PT Time Calculation (min) 45 min      Past Medical History:  Diagnosis Date  . Allergy     Past Surgical History:  Procedure Laterality Date  . ANKLE SURGERY Left 2010  . MANDIBLE SURGERY  11/15/12  . TONSILLECTOMY AND ADENOIDECTOMY  1/12  . WISDOM TOOTH EXTRACTION  12/11    There were no vitals filed for this visit.      Subjective Assessment - 03/22/17 1250    Subjective Dull pain 6/10 constant up to 8/10 stabbing pain    Currently in Pain? Yes   Pain Score 6    Pain Location Back   Pain Orientation Lower   Pain Descriptors / Indicators Dull;Stabbing   Aggravating Factors  moving, lifting, carrying, prolonged sitting.    Pain Relieving Factors changing positions, stretch, rest                          [facility] Adult PT Treatment/Exercise - 03/22/17 0001      Lumbar Exercises: Stretches   Single Knee to Chest Stretch 3 reps;30 seconds     Lumbar Exercises: Supine   Ab Set 10 reps   Clam 20 reps   Clam Limitations bilateral and unilateral    Heel Slides 10 reps   Heel Slides Limitations cues for breathing    Bent Knee Raise 20 reps;1 second  cues to slow down and breath with movements.      Knee/Hip Exercises: Stretches   Other Knee/Hip Stretches lower trunk rotation 2 x 30 sec each     Knee/Hip Exercises: Supine   Other Supine Knee/Hip Exercises x 20 clam supine with green band                   PT Short Term Goals - 03/17/17 0944       PT SHORT TERM GOAL #1   Title pt will be I with inital HEP (03/29/2017)   Time 4   Period Weeks   Status On-going     PT SHORT TERM GOAL #2   Title she will demo proper posture/ lifting and carrying mechanics to prevent and reduce low back pain (03/29/2017)   Baseline able return demo proper lift of stool, painful    Time 4   Period Weeks   Status On-going     PT SHORT TERM GOAL #3   Title she will demo decreased muscle tightness in the back to reduce pain to </=5/10 and promote trunk mobility (03/29/2017)   Baseline 3/10 today, has not worked in 6 days    Time 3   Period Weeks   Status Partially Met           PT Long Term Goals - 03/08/17 1700      PT LONG TERM GOAL #1   Title pt will be I with all HEP given as of last visit (04/19/2017)   Time  6   Period Weeks   Status New     PT LONG TERM GOAL #2   Title she will increase bil hip abductor/ extensor strength to >/= 4+/5 to assist with walking/ standing and assist with lifting for work related tasks (04/19/2017)   Time 6   Period Weeks   Status New     PT LONG TERM GOAL #3   Title she will increase trunk flexion/ bil side bending by >/= 10 degrees to assist with ADLS and work tasks (2/68/3419)   Time 6   Period Weeks   Status New     PT LONG TERM GOAL #4   Title pt will improve her FOTO to </= 34% limited to demonstrate increase in function (04/19/2017)   Time 6   Period Weeks   Status New               Plan - 03/22/17 1323    Clinical Impression Statement Pt reports dead bug exercises are increasing her back pain. Asked her to scale her core exercise back down to traditional bent knee raises. Performed prepilates series with pain remaining the same. Her pain is central across low back today so no manual techniques performed. At rest her pain is 6/10 therefore we tried IFC to decrease pain.    PT Next Visit Plan assess response to DN, progress core strengthening, manual for lumbar paraspinals, posterior  rotation of innominate on R/L depending on pelvis, hip strengthening, Pilates/ reformer   PT Home Exercise Plan hamstring stretch, SLR, lower trunk rotation, Dead bug, seated pelvic tilt, innominate rotation scissor technique   Consulted and Agree with Plan of Care Patient      Patient will benefit from skilled therapeutic intervention in order to improve the following deficits and impairments:  Pain, Improper body mechanics, Postural dysfunction, Decreased endurance, Decreased activity tolerance, Decreased balance, Decreased range of motion, Increased muscle spasms, Decreased strength, Increased fascial restricitons  Visit Diagnosis: Muscle spasm of back  Chronic right-sided low back pain without sciatica  Abnormal posture     Problem List There are no active problems to display for this patient.   Dorene Ar, [place] 03/22/2017, 1:30 PM  [place], [place], [address] Phone: [phone]   Fax:  [phone]  Name: Madison Cooper MRN: [id number] Date of Birth: [birth year]       PHYSICAL THERAPY DISCHARGE SUMMARY  Visits from Start of Care: 5  Current functional level related to goals / functional outcomes: See goals   Remaining deficits: Continued low back pain. Limited compliance with HEP.    Education / Equipment: HEP, posture, theraband,   Plan: Patient agrees to discharge.  Patient goals were partially met. Patient is being discharged due to the patient's request.  ?????    Starr Lake PT, DPT, LAT, ATC  03/27/17  12:51 PM

## 2017-03-27 ENCOUNTER — Ambulatory Visit: Payer: 59 | Admitting: Physical Therapy

## 2017-03-29 ENCOUNTER — Ambulatory Visit: Payer: 59 | Admitting: Physical Therapy

## 2017-04-03 ENCOUNTER — Ambulatory Visit: Payer: 59 | Admitting: Physical Therapy

## 2017-04-05 ENCOUNTER — Encounter: Payer: 59 | Admitting: Physical Therapy

## 2018-01-02 ENCOUNTER — Ambulatory Visit: Payer: 59 | Admitting: Nurse Practitioner

## 2018-09-14 MED FILL — NUVARING VAGINAL RING: 0.12-0.015 | 84 days supply | Qty: 3 | Fill #0

## 2018-10-22 MED FILL — NUVARING VAGINAL RING: 0.12-0.015 | 84 days supply | Qty: 3 | Fill #0

## 2019-01-01 DIAGNOSIS — Z01419 Encounter for gynecological examination (general) (routine) without abnormal findings: Secondary | ICD-10-CM | POA: Diagnosis not present

## 2019-01-01 DIAGNOSIS — Z6837 Body mass index (BMI) 37.0-37.9, adult: Secondary | ICD-10-CM | POA: Diagnosis not present

## 2019-01-01 DIAGNOSIS — Z118 Encounter for screening for other infectious and parasitic diseases: Secondary | ICD-10-CM | POA: Diagnosis not present

## 2019-01-01 DIAGNOSIS — Z1151 Encounter for screening for human papillomavirus (HPV): Secondary | ICD-10-CM | POA: Diagnosis not present

## 2019-01-01 LAB — HM PAP SMEAR: HM Pap smear: NEGATIVE

## 2019-01-16 NOTE — Progress Notes (Signed)
Subjective:    Patient ID: Madison Cooper, female    DOB: 25-May-1991, 28 y.o.   MRN: [id number]  HPI:  Ms. Andrey Campanile is here to establish as a new pt.  She is pleasant 28 year old female. PMH: Recent increase in fatigue and spondylolisthesis  She is followed by [facility] for chronic back pain She denies regular exercise due to pain She estimates to drink >80 oz water/day and follows heart healthy diet She recently graduated Nursing School works in NICU at [employer] She is engaged, building new home with her finance She is concerned that her increasing fatigue is r/t to thyroid d/o- strong family hx of thyroid disease She denies tobacco/vape/excessive use  Patient Care Team    Relationship Specialty Notifications Start End  [place], Orpha Bur D, NP PCP - General Family Medicine  01/22/19   Coletta Memos, MD Consulting Physician Neurosurgery  01/22/19   Shea Evans, MD Consulting Physician Obstetrics and Gynecology  01/22/19   Antonietta Barcelona, OD Referring Physician Optometry  01/22/19     Patient Active Problem List   Diagnosis Date Noted  . Healthcare maintenance 01/22/2019  . Spondylolisthesis 01/22/2019  . BMI 36.0-36.9,adult 01/22/2019     Past Medical History:  Diagnosis Date  . Allergy      Past Surgical History:  Procedure Laterality Date  . ANKLE SURGERY Left 2010  . MANDIBLE SURGERY  11/15/12  . TONSILLECTOMY AND ADENOIDECTOMY  1/12  . TYMPANOSTOMY TUBE PLACEMENT    . WISDOM TOOTH EXTRACTION  12/11     Family History  Problem Relation Age of Onset  . Cancer Mother        basal cell  . Alcohol abuse Father   . Heart attack Father   . Alcohol abuse Brother   . Depression Brother   . Heart disease Maternal Grandfather   . Diabetes Maternal Grandfather   . Heart disease Paternal Grandfather   . Diabetes Paternal Grandfather      Social History   Substance and Sexual Activity  Drug Use No     Social History   Substance and Sexual Activity   Alcohol Use No  . Alcohol/week: 0.0 standard drinks   Comment: Less than 1      Social History   Tobacco Use  Smoking Status Never Smoker  Smokeless Tobacco Never Used     Outpatient Encounter Medications as of 01/22/2019  Medication Sig  . acetaminophen (TYLENOL) 325 MG tablet Take 650 mg by mouth every 6 (six) hours as needed.  . etonogestrel-ethinyl estradiol (NUVARING) 0.12-0.015 MG/24HR vaginal ring Use for 3 weeks, then remove for 1 week.  . meloxicam (MOBIC) 15 MG tablet Take 15 mg by mouth daily.  . Misc Natural Products (CRANBERRY/PROBIOTIC PO) Take 1 tablet by mouth daily.  . [DISCONTINUED] cetirizine (ZYRTEC) 10 MG tablet Take 1 tablet (10 mg total) by mouth daily.  . [DISCONTINUED] fluticasone (FLONASE) 50 MCG/ACT nasal spray Place 2 sprays into both nostrils daily.   No facility-administered encounter medications on file as of 01/22/2019.     Allergies: Diclofenac; Adhesive [tape]; and Sulfa antibiotics  Body mass index is 36.72 kg/m.  Blood pressure 121/81, pulse 94, temperature 99.1 F (37.3 C), temperature source Oral, height 5\' 11"  (1.803 m), weight 263 lb 4.8 oz (119.4 kg), last menstrual period 12/28/2018, SpO2 98 %.  Review of Systems  Constitutional: Positive for fatigue. Negative for activity change, appetite change, chills, diaphoresis, fever and unexpected weight change.  HENT: Negative for congestion.  Respiratory: Negative for cough, chest tightness, shortness of breath, wheezing and stridor.   Cardiovascular: Negative for chest pain, palpitations and leg swelling.  Gastrointestinal: Negative for abdominal distention, anal bleeding, blood in stool, constipation, diarrhea and vomiting.  Endocrine: Negative for cold intolerance, heat intolerance, polydipsia, polyphagia and polyuria.  Genitourinary: Negative for difficulty urinating and flank pain.  Musculoskeletal: Positive for arthralgias, back pain and myalgias. Negative for gait problem, joint  swelling, neck pain and neck stiffness.  Skin: Negative for color change, pallor, rash and wound.  Neurological: Negative for dizziness and headaches.  Hematological: Does not bruise/bleed easily.  Psychiatric/Behavioral: Negative for agitation, behavioral problems, confusion, decreased concentration, dysphoric mood, hallucinations, self-injury, sleep disturbance and suicidal ideas. The patient is not nervous/anxious and is not hyperactive.        Objective:   Physical Exam Vitals signs and nursing note reviewed.  Constitutional:      General: She is not in acute distress.    Appearance: Normal appearance. She is obese. She is not ill-appearing, toxic-appearing or diaphoretic.  HENT:     Head: Normocephalic and atraumatic.  Cardiovascular:     Rate and Rhythm: Normal rate.     Pulses: Normal pulses.     Heart sounds: Normal heart sounds. No murmur. No friction rub. No gallop.   Pulmonary:     Effort: Pulmonary effort is normal. No respiratory distress.     Breath sounds: Normal breath sounds. No stridor. No wheezing, rhonchi or rales.  Chest:     Chest wall: No tenderness.  Skin:    Capillary Refill: Capillary refill takes less than 2 seconds.  Neurological:     Mental Status: She is alert.  Psychiatric:        Mood and Affect: Mood normal.        Behavior: Behavior normal.        Thought Content: Thought content normal.        Judgment: Judgment normal.       Assessment & Plan:   1. Healthcare maintenance   2. Spondylolisthesis, unspecified spinal region   3. BMI 36.0-36.9,adult     Healthcare maintenance Continue to drink plenty of water and follow Mediterranean Diet. Increase regular exercise as tolerated, ie swimming, yoga, stationary bike. Please schedule complete physical in 6 weeks, fasting labs the week prior.  Spondylolisthesis Increase regular exercise  Followed by [facility]   BMI 36.0-36.9,adult Mediterranean Diet Increase exercise as  tolerated Body mass index is 36.72 kg/m.  Current wt 263    FOLLOW-UP:  Return in about 6 weeks (around 03/05/2019) for Fasting Labs, CPE.

## 2019-01-22 ENCOUNTER — Ambulatory Visit: Payer: 59 | Admitting: Adult Health

## 2019-01-22 ENCOUNTER — Encounter: Payer: Self-pay | Admitting: Adult Health

## 2019-01-22 VITALS — BP 121/81 | HR 94 | Temp 99.1°F | Ht 71.0 in | Wt 263.3 lb

## 2019-01-22 DIAGNOSIS — Z6838 Body mass index (BMI) 38.0-38.9, adult: Secondary | ICD-10-CM | POA: Insufficient documentation

## 2019-01-22 DIAGNOSIS — Z0000 Encounter for general adult medical examination without abnormal findings: Secondary | ICD-10-CM | POA: Insufficient documentation

## 2019-01-22 DIAGNOSIS — M4310 Spondylolisthesis, site unspecified: Secondary | ICD-10-CM | POA: Diagnosis not present

## 2019-01-22 DIAGNOSIS — Z6836 Body mass index (BMI) 36.0-36.9, adult: Secondary | ICD-10-CM

## 2019-01-22 NOTE — Assessment & Plan Note (Signed)
Increase regular exercise  Followed by [facility]

## 2019-01-22 NOTE — Patient Instructions (Addendum)
Mediterranean Diet A Mediterranean diet refers to food and lifestyle choices that are based on the traditions of countries located on the [employer]. This way of eating has been shown to help prevent certain conditions and improve outcomes for people who have chronic diseases, like kidney disease and heart disease. What are tips for following this plan? Lifestyle  Cook and eat meals together with your family, when possible.  Drink enough fluid to keep your urine clear or pale yellow.  Be physically active every day. This includes: ? Aerobic exercise like running or swimming. ? Leisure activities like gardening, walking, or housework.  Get 7-8 hours of sleep each night.  If recommended by your health care provider, drink red wine in moderation. This means 1 glass a day for nonpregnant women and 2 glasses a day for men. A glass of wine equals 5 oz (150 mL). Reading food labels   Check the serving size of packaged foods. For foods such as rice and pasta, the serving size refers to the amount of cooked product, not dry.  Check the total fat in packaged foods. Avoid foods that have saturated fat or trans fats.  Check the ingredients list for added sugars, such as corn syrup. Shopping  At the grocery store, buy most of your food from the areas near the walls of the store. This includes: ? Fresh fruits and vegetables (produce). ? Grains, beans, nuts, and seeds. Some of these may be available in unpackaged forms or large amounts (in bulk). ? Fresh seafood. ? Poultry and eggs. ? Low-fat dairy products.  Buy whole ingredients instead of prepackaged foods.  Buy fresh fruits and vegetables in-season from local farmers markets.  Buy frozen fruits and vegetables in resealable bags.  If you do not have access to quality fresh seafood, buy precooked frozen shrimp or canned fish, such as tuna, salmon, or sardines.  Buy small amounts of raw or cooked vegetables, salads, or olives from  the deli or salad bar at your store.  Stock your pantry so you always have certain foods on hand, such as olive oil, canned tuna, canned tomatoes, rice, pasta, and beans. Cooking  Cook foods with extra-virgin olive oil instead of using butter or other vegetable oils.  Have meat as a side dish, and have vegetables or grains as your main dish. This means having meat in small portions or adding small amounts of meat to foods like pasta or stew.  Use beans or vegetables instead of meat in common dishes like chili or lasagna.  Experiment with different cooking methods. Try roasting or broiling vegetables instead of steaming or sauteing them.  Add frozen vegetables to soups, stews, pasta, or rice.  Add nuts or seeds for added healthy fat at each meal. You can add these to yogurt, salads, or vegetable dishes.  Marinate fish or vegetables using olive oil, lemon juice, garlic, and fresh herbs. Meal planning   Plan to eat 1 vegetarian meal one day each week. Try to work up to 2 vegetarian meals, if possible.  Eat seafood 2 or more times a week.  Have healthy snacks readily available, such as: ? Vegetable sticks with hummus. ? [place] yogurt. ? Fruit and nut trail mix.  Eat balanced meals throughout the week. This includes: ? Fruit: 2-3 servings a day ? Vegetables: 4-5 servings a day ? Low-fat dairy: 2 servings a day ? Fish, poultry, or lean meat: 1 serving a day ? Beans and legumes: 2 or more servings a week ?  Nuts and seeds: 1-2 servings a day ? Whole grains: 6-8 servings a day ? Extra-virgin olive oil: 3-4 servings a day  Limit red meat and sweets to only a few servings a month What are my food choices?  Mediterranean diet ? Recommended ? Grains: Whole-grain pasta. Brown rice. Bulgar wheat. Polenta. Couscous. Whole-wheat bread. Orpah Cobb. ? Vegetables: Artichokes. Beets. Broccoli. Cabbage. Carrots. Eggplant. Green beans. Chard. Kale. Spinach. Onions. Leeks. Peas. Squash.  Tomatoes. Peppers. Radishes. ? Fruits: Apples. Apricots. Avocado. Berries. Bananas. Cherries. Dates. Figs. Grapes. Lemons. Melon. Oranges. Peaches. Plums. Pomegranate. ? Meats and other protein foods: Beans. Almonds. Sunflower seeds. Pine nuts. Peanuts. Cod. Salmon. Scallops. Shrimp. Tuna. Tilapia. Clams. Oysters. Eggs. ? Dairy: Low-fat milk. Cheese. Greek yogurt. ? Beverages: Water. Red wine. Herbal tea. ? Fats and oils: Extra virgin olive oil. Avocado oil. Grape seed oil. ? Sweets and desserts: [place] yogurt with honey. Baked apples. Poached pears. Trail mix. ? Seasoning and other foods: Basil. Cilantro. Coriander. Cumin. Mint. Parsley. Sage. Rosemary. Tarragon. Garlic. Oregano. Thyme. Pepper. Balsalmic vinegar. Tahini. Hummus. Tomato sauce. Olives. Mushrooms. ? Limit these ? Grains: Prepackaged pasta or rice dishes. Prepackaged cereal with added sugar. ? Vegetables: Deep fried potatoes (french fries). ? Fruits: Fruit canned in syrup. ? Meats and other protein foods: Beef. Pork. Lamb. Poultry with skin. Hot dogs. Tomasa Blase. ? Dairy: Ice cream. Sour cream. Whole milk. ? Beverages: Juice. Sugar-sweetened soft drinks. Beer. Liquor and spirits. ? Fats and oils: Butter. Canola oil. Vegetable oil. Beef fat (tallow). Lard. ? Sweets and desserts: Cookies. Cakes. Pies. Candy. ? Seasoning and other foods: Mayonnaise. Premade sauces and marinades. ? The items listed may not be a complete list. Talk with your dietitian about what dietary choices are right for you. Summary  The Mediterranean diet includes both food and lifestyle choices.  Eat a variety of fresh fruits and vegetables, beans, nuts, seeds, and whole grains.  Limit the amount of red meat and sweets that you eat.  Talk with your health care provider about whether it is safe for you to drink red wine in moderation. This means 1 glass a day for nonpregnant women and 2 glasses a day for men. A glass of wine equals 5 oz (150 mL). This information  is not intended to replace advice given to you by your health care provider. Make sure you discuss any questions you have with your health care provider. Document Released: 08/04/2016 Document Revised: 09/06/2016 Document Reviewed: 08/04/2016 Elsevier Interactive Patient Education  2019 [employer].  Continue to drink plenty of water and follow Mediterranean Diet. Increase regular exercise as tolerated, ie swimming, yoga, stationary bike. Please schedule complete physical in 6 weeks, fasting labs the week prior. WELCOME TO THE PRACTICE!

## 2019-01-22 NOTE — Assessment & Plan Note (Signed)
Continue to drink plenty of water and follow Mediterranean Diet. Increase regular exercise as tolerated, ie swimming, yoga, stationary bike. Please schedule complete physical in 6 weeks, fasting labs the week prior.

## 2019-01-22 NOTE — Assessment & Plan Note (Signed)
Mediterranean Diet Increase exercise as tolerated Body mass index is 36.72 kg/m.  Current wt 263

## 2019-01-28 MED FILL — ETONOGESTREL-ETHINYL ESTRAD: 0.12-0.015 | 84 days supply | Qty: 3 | Fill #0

## 2019-02-04 NOTE — Progress Notes (Signed)
Subjective:    Patient ID: Madison Cooper, female    DOB: Jul 14, 1991, 28 y.o.   MRN: [id number]  HPI:  Ms. Andrey Campanile presents with two acute issues 1) itchy rash on right breast that began 5 days ago. She denies change in laundry detergent, change in diet, recent travel. She denies pain or drainage at site. She reports this occurring several months ago, however involved much larger area of R/L breasts. She denies fever/night sweats/N/V/D 2) She reports intermittent,"pinching" R hip pain, rates 9/10 that will occur when she is abducting RLE. She reports beginning new "weighted leg program" about two weeks ago, denies warming up/stretching prior to exercise. Hx of Spondylolisthesis- last contact with PT >6 months ago   Patient Care Team    Relationship Specialty Notifications Start End  William Hamburger D, NP PCP - General Family Medicine  01/22/19   Coletta Memos, MD Consulting Physician Neurosurgery  01/22/19   Shea Evans, MD Consulting Physician Obstetrics and Gynecology  01/22/19   Antonietta Barcelona, OD Referring Physician Optometry  01/22/19     Patient Active Problem List   Diagnosis Date Noted  . Contact dermatitis 02/05/2019  . Acute hip pain, left 02/05/2019  . Healthcare maintenance 01/22/2019  . Spondylolisthesis 01/22/2019  . BMI 36.0-36.9,adult 01/22/2019     Past Medical History:  Diagnosis Date  . Allergy   . Spondylisthesis      Past Surgical History:  Procedure Laterality Date  . ANKLE SURGERY Left 2010  . MANDIBLE SURGERY  11/15/12  . TONSILLECTOMY AND ADENOIDECTOMY  1/12  . TYMPANOSTOMY TUBE PLACEMENT    . WISDOM TOOTH EXTRACTION  12/11     Family History  Problem Relation Age of Onset  . Cancer Mother        basal cell  . Kidney disease Mother        kidney stones  . Alcohol abuse Father   . Heart attack Father   . Gout Father   . Alcohol abuse Brother   . Depression Brother   . Heart disease Maternal Grandfather   . Diabetes Maternal  Grandfather   . Heart attack Maternal Grandfather   . Hypertension Maternal Grandfather   . Heart disease Paternal Grandfather   . Diabetes Paternal Grandfather   . Cancer Paternal Grandfather        lymphoma  . Hypothyroidism Maternal Grandmother   . Alzheimer's disease Paternal Grandmother   . Parkinson's disease Paternal Grandmother   . Scoliosis Paternal Grandmother      Social History   Substance and Sexual Activity  Drug Use No     Social History   Substance and Sexual Activity  Alcohol Use No  . Alcohol/week: 0.0 standard drinks   Comment: Less than 1      Social History   Tobacco Use  Smoking Status Never Smoker  Smokeless Tobacco Never Used     Outpatient Encounter Medications as of 02/05/2019  Medication Sig  . acetaminophen (TYLENOL) 325 MG tablet Take 650 mg by mouth every 6 (six) hours as needed.  . etonogestrel-ethinyl estradiol (NUVARING) 0.12-0.015 MG/24HR vaginal ring Use for 3 weeks, then remove for 1 week.  . meloxicam (MOBIC) 15 MG tablet Take 15 mg by mouth daily as needed for pain.  . Misc Natural Products (CRANBERRY/PROBIOTIC PO) Take 1 tablet by mouth daily.  . [DISCONTINUED] meloxicam (MOBIC) 15 MG tablet Take 15 mg by mouth daily.   No facility-administered encounter medications on file as of 02/05/2019.  Allergies: Diclofenac; Adhesive [tape]; and Sulfa antibiotics  Body mass index is 36.67 kg/m.  Blood pressure 119/82, pulse 85, temperature 99.5 F (37.5 C), temperature source Oral, height 5\' 11"  (1.803 m), weight 262 lb 14.4 oz (119.3 kg), last menstrual period 01/22/2019, SpO2 99 %.  Review of Systems  Constitutional: Positive for fatigue. Negative for activity change, appetite change, chills, diaphoresis, fever and unexpected weight change.  Eyes: Negative for visual disturbance.  Respiratory: Negative for chest tightness, shortness of breath, wheezing and stridor.   Cardiovascular: Negative for chest pain, palpitations and  leg swelling.  Gastrointestinal: Negative for abdominal distention, abdominal pain, blood in stool, constipation, diarrhea, nausea and vomiting.  Musculoskeletal: Positive for arthralgias and myalgias. Negative for back pain, gait problem, joint swelling, neck pain and neck stiffness.  Skin: Positive for color change and rash. Negative for pallor and wound.  Neurological: Negative for dizziness and headaches.  Hematological: Does not bruise/bleed easily.       Objective:   Physical Exam Vitals signs and nursing note reviewed.  Constitutional:      General: She is not in acute distress.    Appearance: She is obese. She is not ill-appearing, toxic-appearing or diaphoretic.  HENT:     Head: Normocephalic and atraumatic.  Cardiovascular:     Rate and Rhythm: Normal rate.     Pulses: Normal pulses.     Heart sounds: Normal heart sounds. No murmur. No friction rub. No gallop.   Pulmonary:     Effort: Pulmonary effort is normal. No respiratory distress.     Breath sounds: No stridor. No wheezing, rhonchi or rales.  Chest:     Chest wall: No tenderness.  Musculoskeletal: Normal range of motion.        General: Tenderness present. No swelling, deformity or signs of injury.     Right hip: She exhibits tenderness. She exhibits normal range of motion, normal strength and no crepitus.     Left hip: Normal.     Right knee: Normal.     Right lower leg: No edema.     Left lower leg: No edema.  Skin:    General: Skin is warm and dry.     Capillary Refill: Capillary refill takes less than 2 seconds.     Coloration: Skin is not jaundiced or pale.     Findings: Erythema and rash present. No bruising or lesion. Rash is macular.          Comments: Erythema noted on R breast > L breast  Flat macular rash  No open tissue/drainage noted   Neurological:     Mental Status: She is alert and oriented to person, place, and time.  Psychiatric:        Mood and Affect: Mood normal.        Behavior:  Behavior normal.        Thought Content: Thought content normal.        Judgment: Judgment normal.           Assessment & Plan:   1. Contact dermatitis, unspecified contact dermatitis type, unspecified trigger   2. Acute right hip pain     Contact dermatitis Recommend morning dose of Claritin and evening dose of Benadryl. Continue OTC topicals as you have been using. Remain well hydrated. If rash or hip pain do not improve in several weeks, please call clinic.  Acute hip pain, left Recommend stretching prior to any exercise program. If rash or hip pain do not improve in  several weeks, please call clinic.    FOLLOW-UP:  Return if symptoms worsen or fail to improve.

## 2019-02-05 ENCOUNTER — Encounter: Payer: Self-pay | Admitting: Adult Health

## 2019-02-05 ENCOUNTER — Ambulatory Visit: Payer: 59 | Admitting: Adult Health

## 2019-02-05 VITALS — BP 119/82 | HR 85 | Temp 99.5°F | Ht 71.0 in | Wt 262.9 lb

## 2019-02-05 DIAGNOSIS — M25551 Pain in right hip: Secondary | ICD-10-CM | POA: Diagnosis not present

## 2019-02-05 DIAGNOSIS — M25552 Pain in left hip: Secondary | ICD-10-CM | POA: Insufficient documentation

## 2019-02-05 DIAGNOSIS — L259 Unspecified contact dermatitis, unspecified cause: Secondary | ICD-10-CM | POA: Insufficient documentation

## 2019-02-05 NOTE — Patient Instructions (Signed)
Contact Dermatitis Dermatitis is redness, soreness, and swelling (inflammation) of the skin. Contact dermatitis is a reaction to certain substances that touch the skin. Many different substances can cause contact dermatitis. There are two types of contact dermatitis:  Irritant contact dermatitis. This type is caused by something that irritates your skin, such as having dry hands from washing them too often with soap. This type does not require previous exposure to the substance for a reaction to occur. This is the most common type.  Allergic contact dermatitis. This type is caused by a substance that you are allergic to, such as poison ivy. This type occurs when you have been exposed to the substance (allergen) and develop a sensitivity to it. Dermatitis may develop soon after your first exposure to the allergen, or it may not develop until the next time you are exposed and every time thereafter. What are the causes? Irritant contact dermatitis is most commonly caused by exposure to:  Makeup.  Soaps.  Detergents.  Bleaches.  Acids.  Metal salts, such as nickel. Allergic contact dermatitis is most commonly caused by exposure to:  Poisonous plants.  Chemicals.  Jewelry.  Latex.  Medicines.  Preservatives in products, such as clothing. What increases the risk? You are more likely to develop this condition if you have:  A job that exposes you to irritants or allergens.  Certain medical conditions, such as asthma or eczema. What are the signs or symptoms? Symptoms of this condition may occur on your body anywhere the irritant has touched you or is touched by you.  Symptoms include: ? Dryness or flaking. ? Redness. ? Cracks. ? Itching. ? Pain or a burning feeling. ? Blisters. ? Drainage of small amounts of blood or clear fluid from skin cracks. With allergic contact dermatitis, there may also be swelling in areas such as the eyelids, mouth, or genitals. How is this  diagnosed? This condition is diagnosed with a medical history and physical exam.  A patch skin test may be performed to help determine the cause.  If the condition is related to your job, you may need to see an occupational medicine specialist. How is this treated? This condition is treated by checking for the cause of the reaction and protecting your skin from further contact. Treatment may also include:  Steroid creams or ointments. Oral steroid medicines may be needed in more severe cases.  Antibiotic medicines or antibacterial ointments, if a skin infection is present.  Antihistamine lotion or an antihistamine taken by mouth to ease itching.  A bandage (dressing). Follow these instructions at home: Skin care  Moisturize your skin as needed.  Apply cool compresses to the affected areas.  Try applying baking soda paste to your skin. Stir water into baking soda until it reaches a paste-like consistency.  Do not scratch your skin, and avoid friction to the affected area.  Avoid the use of soaps, perfumes, and dyes. Medicines  Take or apply over-the-counter and prescription medicines only as told by your health care provider.  If you were prescribed an antibiotic medicine, take or apply the antibiotic as told by your health care provider. Do not stop using the antibiotic even if your condition improves. Bathing  Try taking a bath with: ? Epsom salts. Follow the instructions on the packaging. You can get these at your local pharmacy or grocery store. ? Baking soda. Pour a small amount into the bath as directed by your health care provider. ? Colloidal oatmeal. Follow the instructions on the   packaging. You can get this at your local pharmacy or grocery store.  Bathe less frequently, such as every other day.  Bathe in lukewarm water. Avoid using hot water. Bandage care  If you were given a bandage (dressing), change it as told by your health care provider.  Wash your hands  with soap and water before and after you change your dressing. If soap and water are not available, use hand sanitizer. General instructions  Avoid the substance that caused your reaction. If you do not know what caused it, keep a journal to try to track what caused it. Write down: ? What you eat. ? What cosmetic products you use. ? What you drink. ? What you wear in the affected area. This includes jewelry.  Check the affected areas every day for signs of infection. Check for: ? More redness, swelling, or pain. ? More fluid or blood. ? Warmth. ? Pus or a bad smell.  Keep all follow-up visits as told by your health care provider. This is important. Contact a health care provider if:  Your condition does not improve with treatment.  Your condition gets worse.  You have signs of infection such as swelling, tenderness, redness, soreness, or warmth in the affected area.  You have a fever.  You have new symptoms. Get help right away if:  You have a severe headache, neck pain, or neck stiffness.  You vomit.  You feel very sleepy.  You notice red streaks coming from the affected area.  Your bone or joint underneath the affected area becomes painful after the skin has healed.  The affected area turns darker.  You have difficulty breathing. Summary  Dermatitis is redness, soreness, and swelling (inflammation) of the skin. Contact dermatitis is a reaction to certain substances that touch the skin.  Symptoms of this condition may occur on your body anywhere the irritant has touched you or is touched by you.  This condition is treated by figuring out what caused the reaction and protecting your skin from further contact. Treatment may also include medicines and skin care.  Avoid the substance that caused your reaction. If you do not know what caused it, keep a journal to try to track what caused it.  Contact a health care provider if your condition gets worse or you have signs  of infection such as swelling, tenderness, redness, soreness, or warmth in the affected area. This information is not intended to replace advice given to you by your health care provider. Make sure you discuss any questions you have with your health care provider. Document Released: 12/09/2000 Document Revised: 06/27/2018 Document Reviewed: 06/27/2018 Elsevier Interactive Patient Education  2019 Elsevier Inc.   Hip Pain  The hip is the joint between the upper legs and the lower pelvis. The bones, cartilage, tendons, and muscles of your hip joint support your body and allow you to move around. Hip pain can range from a minor ache to severe pain in one or both of your hips. The pain may be felt on the inside of the hip joint near the groin, or the outside near the buttocks and upper thigh. You may also have swelling or stiffness. Follow these instructions at home: Managing pain, stiffness, and swelling  If directed, apply ice to the injured area. ? Put ice in a plastic bag. ? Place a towel between your skin and the bag. ? Leave the ice on for 20 minutes, 2-3 times a day  Sleep with a pillow between your  legs on your most comfortable side.  Avoid any activities that cause pain. General instructions  Take over-the-counter and prescription medicines only as told by your health care provider.  Do any exercises as told by your health care provider.  Record the following: ? How often you have hip pain. ? The location of your pain. ? What the pain feels like. ? What makes the pain worse.  Keep all follow-up visits as told by your health care provider. This is important. Contact a health care provider if:  You cannot put weight on your leg.  Your pain or swelling continues or gets worse after one week.  It gets harder to walk.  You have a fever. Get help right away if:  You fall.  You have a sudden increase in pain and swelling in your hip.  Your hip is red or swollen or very  tender to touch. Summary  Hip pain can range from a minor ache to severe pain in one or both of your hips.  The pain may be felt on the inside of the hip joint near the groin, or the outside near the buttocks and upper thigh.  Avoid any activities that cause pain.  Record how often you have hip pain, the location of the pain, what makes it worse and what it feels like. This information is not intended to replace advice given to you by your health care provider. Make sure you discuss any questions you have with your health care provider. Document Released: 06/01/2010 Document Revised: 11/14/2016 Document Reviewed: 11/14/2016 Elsevier Interactive Patient Education  2019 Elsevier Inc.  Recommend morning dose of Claritin and evening dose of Benadryl. Continue OTC topicals as you have been using. Remain well hydrated. Recommend stretching prior to any exercise program. If rash or hip pain do not improve in several weeks, please call clinic. Good luck with Mother/Baby Unit move! NICE TO SEE YOU!

## 2019-02-05 NOTE — Assessment & Plan Note (Signed)
Recommend stretching prior to any exercise program. If rash or hip pain do not improve in several weeks, please call clinic.

## 2019-02-05 NOTE — Assessment & Plan Note (Signed)
Recommend morning dose of Claritin and evening dose of Benadryl. Continue OTC topicals as you have been using. Remain well hydrated. If rash or hip pain do not improve in several weeks, please call clinic.

## 2019-02-21 ENCOUNTER — Encounter: Payer: Self-pay | Admitting: Adult Health

## 2019-02-25 DIAGNOSIS — M25551 Pain in right hip: Secondary | ICD-10-CM | POA: Diagnosis not present

## 2019-02-25 DIAGNOSIS — M545 Low back pain: Secondary | ICD-10-CM | POA: Diagnosis not present

## 2019-02-27 DIAGNOSIS — M25551 Pain in right hip: Secondary | ICD-10-CM | POA: Diagnosis not present

## 2019-03-04 ENCOUNTER — Other Ambulatory Visit: Payer: 59

## 2019-03-04 DIAGNOSIS — Z0000 Encounter for general adult medical examination without abnormal findings: Secondary | ICD-10-CM | POA: Diagnosis not present

## 2019-03-05 LAB — COMPREHENSIVE METABOLIC PANEL
ALT: 13 IU/L (ref 0–32)
AST: 12 IU/L (ref 0–40)
Albumin/Globulin Ratio: 1.7 (ref 1.2–2.2)
Albumin: 4.2 g/dL (ref 3.9–5.0)
Alkaline Phosphatase: 54 IU/L (ref 39–117)
BUN/Creatinine Ratio: 14 (ref 9–23)
BUN: 13 mg/dL (ref 6–20)
Bilirubin Total: 0.3 mg/dL (ref 0.0–1.2)
CO2: 21 mmol/L (ref 20–29)
Calcium: 9.2 mg/dL (ref 8.7–10.2)
Chloride: 103 mmol/L (ref 96–106)
Creatinine, Ser: 0.96 mg/dL (ref 0.57–1.00)
GFR calc Af Amer: 95 mL/min/{1.73_m2} (ref >59)
GFR calc non Af Amer: 82 mL/min/{1.73_m2} (ref >59)
Globulin, Total: 2.5 g/dL (ref 1.5–4.5)
Glucose: 87 mg/dL (ref 65–99)
Potassium: 4.7 mmol/L (ref 3.5–5.2)
Sodium: 141 mmol/L (ref 134–144)
Total Protein: 6.7 g/dL (ref 6.0–8.5)

## 2019-03-05 LAB — CBC WITH DIFFERENTIAL/PLATELET
Basophils Absolute: 0.1 10*3/uL (ref 0.0–0.2)
Basos: 1 %
EOS (ABSOLUTE): 0.1 10*3/uL (ref 0.0–0.4)
Eos: 1 %
Hematocrit: 43.3 % (ref 34.0–46.6)
Hemoglobin: 14.1 g/dL (ref 11.1–15.9)
Immature Grans (Abs): 0 10*3/uL (ref 0.0–0.1)
Immature Granulocytes: 0 %
Lymphocytes Absolute: 2.9 10*3/uL (ref 0.7–3.1)
Lymphs: 35 %
MCH: 26.8 pg (ref 26.6–33.0)
MCHC: 32.6 g/dL (ref 31.5–35.7)
MCV: 82 fL (ref 79–97)
Monocytes Absolute: 0.5 10*3/uL (ref 0.1–0.9)
Monocytes: 6 %
Neutrophils Absolute: 4.7 10*3/uL (ref 1.4–7.0)
Neutrophils: 57 %
Platelets: 300 10*3/uL (ref 150–450)
RBC: 5.26 x10E6/uL (ref 3.77–5.28)
RDW: 12 % (ref 11.7–15.4)
WBC: 8.3 10*3/uL (ref 3.4–10.8)

## 2019-03-05 LAB — LIPID PANEL
Chol/HDL Ratio: 3 ratio (ref 0.0–4.4)
Cholesterol, Total: 167 mg/dL (ref 100–199)
HDL: 55 mg/dL (ref >39)
LDL Calculated: 102 mg/dL — ABNORMAL HIGH (ref 0–99)
Triglycerides: 48 mg/dL (ref 0–149)
VLDL Cholesterol Cal: 10 mg/dL (ref 5–40)

## 2019-03-05 LAB — TSH: TSH: 2.2 u[IU]/mL (ref 0.450–4.500)

## 2019-03-05 LAB — HEMOGLOBIN A1C
Est. average glucose Bld gHb Est-mCnc: 120 mg/dL
Hgb A1c MFr Bld: 5.8 % — ABNORMAL HIGH (ref 4.8–5.6)

## 2019-03-05 NOTE — Progress Notes (Signed)
Subjective:    Patient ID: Madison Cooper, female    DOB: 03-Feb-1991, 28 y.o.   MRN: [id number]  HPI:01/22/2019 OV:   Madison Cooper is here to establish as a new pt.  She is pleasant 28 year old female. PMH: Recent increase in fatigue and spondylolisthesis  She is followed by [facility] for chronic back pain She denies regular exercise due to pain She estimates to drink >80 oz water/day and follows heart healthy diet She recently graduated Nursing School works in NICU at [employer] She is engaged, building new home with her finance She is concerned that her increasing fatigue is r/t to thyroid d/o- strong family hx of thyroid disease She denies tobacco/vape/excessive use  03/11/2019 OV: Madison Cooper is here for CPE She recently started exercising at local [employer]- water aerobics- now closed due to COVID-19 She continues to drink >gallon water/day She has been trying to follow heart healthy diet She continues to abstain from tobacco/vape/excessive ETOH  Reviewed Recent Labs: TSH-WNL Lipid Panel-  LDL102 A1c-5.8, pre-diabetic CMP-WNL CBC-WNL  Healthcare Maintenance: PAP-UTD, 12/28/2016, last WNL Immunizations-UTD   Patient Care Team    Relationship Specialty Notifications Start End  Julaine Fusi, NP PCP - General Family Medicine  01/22/19   Coletta Memos, MD Consulting Physician Neurosurgery  01/22/19   Shea Evans, MD Consulting Physician Obstetrics and Gynecology  01/22/19   Antonietta Barcelona, OD Referring Physician Optometry  01/22/19     Patient Active Problem List   Diagnosis Date Noted  . Contact dermatitis 02/05/2019  . Acute hip pain, left 02/05/2019  . Healthcare maintenance 01/22/2019  . Spondylolisthesis 01/22/2019  . BMI 38.0-38.9,adult 01/22/2019     Past Medical History:  Diagnosis Date  . Allergy   . Spondylisthesis      Past Surgical History:  Procedure Laterality Date  . ANKLE SURGERY Left 2010  . MANDIBLE SURGERY  11/15/12  .  TONSILLECTOMY AND ADENOIDECTOMY  1/12  . TYMPANOSTOMY TUBE PLACEMENT    . WISDOM TOOTH EXTRACTION  12/11     Family History  Problem Relation Age of Onset  . Cancer Mother        basal cell  . Kidney disease Mother        kidney stones  . Alcohol abuse Father   . Heart attack Father   . Gout Father   . Alcohol abuse Brother   . Depression Brother   . Heart disease Maternal Grandfather   . Diabetes Maternal Grandfather   . Heart attack Maternal Grandfather   . Hypertension Maternal Grandfather   . Heart disease Paternal Grandfather   . Diabetes Paternal Grandfather   . Cancer Paternal Grandfather        lymphoma  . Hypothyroidism Maternal Grandmother   . Alzheimer's disease Paternal Grandmother   . Parkinson's disease Paternal Grandmother   . Scoliosis Paternal Grandmother      Social History   Substance and Sexual Activity  Drug Use No     Social History   Substance and Sexual Activity  Alcohol Use No  . Alcohol/week: 0.0 standard drinks   Comment: Less than 1      Social History   Tobacco Use  Smoking Status Never Smoker  Smokeless Tobacco Never Used     Outpatient Encounter Medications as of 03/11/2019  Medication Sig  . acetaminophen (TYLENOL) 325 MG tablet Take 650 mg by mouth every 6 (six) hours as needed.  . etonogestrel-ethinyl estradiol (NUVARING) 0.12-0.015 MG/24HR vaginal ring Use  for 3 weeks, then remove for 1 week.  . meloxicam (MOBIC) 15 MG tablet Take 15 mg by mouth daily as needed for pain.  . Misc Natural Products (CRANBERRY/PROBIOTIC PO) Take 1 tablet by mouth daily.   No facility-administered encounter medications on file as of 03/11/2019.     Allergies: Diclofenac; Adhesive [tape]; and Sulfa antibiotics  Body mass index is 38.43 kg/m.  Blood pressure 102/70, pulse 92, temperature 98.8 F (37.1 C), temperature source Oral, height 5' 9.25" (1.759 m), weight 262 lb 1.6 oz (118.9 kg), SpO2 99 %.  Review of Systems   Constitutional: Positive for fatigue. Negative for activity change, appetite change, chills, diaphoresis, fever and unexpected weight change.  HENT: Negative for congestion.   Respiratory: Negative for cough, chest tightness, shortness of breath, wheezing and stridor.   Cardiovascular: Negative for chest pain, palpitations and leg swelling.  Gastrointestinal: Negative for abdominal distention, anal bleeding, blood in stool, constipation, diarrhea and vomiting.  Endocrine: Negative for cold intolerance, heat intolerance, polydipsia, polyphagia and polyuria.  Genitourinary: Negative for difficulty urinating and flank pain.  Musculoskeletal: Positive for arthralgias, back pain and myalgias. Negative for gait problem, joint swelling, neck pain and neck stiffness.  Skin: Negative for color change, pallor, rash and wound.  Neurological: Negative for dizziness and headaches.  Hematological: Does not bruise/bleed easily.  Psychiatric/Behavioral: Negative for agitation, behavioral problems, confusion, decreased concentration, dysphoric mood, hallucinations, self-injury, sleep disturbance and suicidal ideas. The patient is not nervous/anxious and is not hyperactive.        Objective:   Physical Exam Vitals signs and nursing note reviewed.  Constitutional:      General: She is not in acute distress.    Appearance: Normal appearance. She is obese. She is not ill-appearing, toxic-appearing or diaphoretic.  HENT:     Head: Normocephalic and atraumatic.  Cardiovascular:     Rate and Rhythm: Normal rate.     Pulses: Normal pulses.     Heart sounds: Normal heart sounds. No murmur. No friction rub. No gallop.   Pulmonary:     Effort: Pulmonary effort is normal. No respiratory distress.     Breath sounds: Normal breath sounds. No stridor. No wheezing, rhonchi or rales.  Chest:     Chest wall: No tenderness.  Skin:    Capillary Refill: Capillary refill takes less than 2 seconds.  Neurological:      Mental Status: She is alert.  Psychiatric:        Mood and Affect: Mood normal.        Behavior: Behavior normal.        Thought Content: Thought content normal.        Judgment: Judgment normal.       Assessment & Plan:   1. BMI 38.0-38.9,adult   2. Healthcare maintenance     Healthcare maintenance Overall your labs looks good with just minor elevation in A1c (average of blood sugar last 90 days) A1c-5.8- pre-diabetic.  To reduce, please reduce carbohydrate and sugar intake. LDL (bad) cholesterol is very slightly elevated, normal <99 Just reduce saturated fat and increase regular exercise. Recommend at least 30 minutes daily, 5 days per week of walking, jogging, biking, swimming, YouTube/Pinterest workout videos. Daisey Must on house build! Recommend annual physical, fasting labs the week prior.  BMI 38.0-38.9,adult Body mass index is 38.43 kg/m.  Current wt 262 Mediterranean Diet, increase regular exercise     FOLLOW-UP:  Return in about 1 year (around 03/10/2020).

## 2019-03-11 ENCOUNTER — Encounter: Payer: Self-pay | Admitting: Adult Health

## 2019-03-11 ENCOUNTER — Ambulatory Visit (INDEPENDENT_AMBULATORY_CARE_PROVIDER_SITE_OTHER): Payer: 59 | Admitting: Adult Health

## 2019-03-11 ENCOUNTER — Other Ambulatory Visit: Payer: Self-pay

## 2019-03-11 VITALS — BP 102/70 | HR 92 | Temp 98.8°F | Ht 69.25 in | Wt 262.1 lb

## 2019-03-11 DIAGNOSIS — Z6838 Body mass index (BMI) 38.0-38.9, adult: Secondary | ICD-10-CM | POA: Diagnosis not present

## 2019-03-11 DIAGNOSIS — Z0000 Encounter for general adult medical examination without abnormal findings: Secondary | ICD-10-CM | POA: Diagnosis not present

## 2019-03-11 NOTE — Assessment & Plan Note (Signed)
Overall your labs looks good with just minor elevation in A1c (average of blood sugar last 90 days) A1c-5.8- pre-diabetic.  To reduce, please reduce carbohydrate and sugar intake. LDL (bad) cholesterol is very slightly elevated, normal <99 Just reduce saturated fat and increase regular exercise. Recommend at least 30 minutes daily, 5 days per week of walking, jogging, biking, swimming, YouTube/Pinterest workout videos. Madison Cooper on house build! Recommend annual physical, fasting labs the week prior.

## 2019-03-11 NOTE — Assessment & Plan Note (Signed)
Body mass index is 38.43 kg/m.  Current wt 262 Mediterranean Diet, increase regular exercise

## 2019-03-11 NOTE — Patient Instructions (Addendum)
Preventive Care for Adults, Female  A healthy lifestyle and preventive care can promote health and wellness. Preventive health guidelines for women include the following key practices.   A routine yearly physical is a good way to check with your health care provider about your health and preventive screening. It is a chance to share any concerns and updates on your health and to receive a thorough exam.   Visit your dentist for a routine exam and preventive care every 6 months. Brush your teeth twice a day and floss once a day. Good oral hygiene prevents tooth decay and gum disease.   The frequency of eye exams is based on your age, health, family medical history, use of contact lenses, and other factors. Follow your health care provider's recommendations for frequency of eye exams.   Eat a healthy diet. Foods like vegetables, fruits, whole grains, low-fat dairy products, and lean protein foods contain the nutrients you need without too many calories. Decrease your intake of foods high in solid fats, added sugars, and salt. Eat the right amount of calories for you.Get information about a proper diet from your health care provider, if necessary.   Regular physical exercise is one of the most important things you can do for your health. Most adults should get at least 150 minutes of moderate-intensity exercise (any activity that increases your heart rate and causes you to sweat) each week. In addition, most adults need muscle-strengthening exercises on 2 or more days a week.   Maintain a healthy weight. The body mass index (BMI) is a screening tool to identify possible weight problems. It provides an estimate of body fat based on height and weight. Your health care provider can find your BMI, and can help you achieve or maintain a healthy weight.For adults 20 years and older:   - A BMI below 18.5 is considered underweight.   - A BMI of 18.5 to 24.9 is normal.   - A BMI of 25 to 29.9 is  considered overweight.   - A BMI of 30 and above is considered obese.   Maintain normal blood lipids and cholesterol levels by exercising and minimizing your intake of trans and saturated fats.  Eat a balanced diet with plenty of fruit and vegetables. Blood tests for lipids and cholesterol should begin at age 20 and be repeated every 5 years minimum.  If your lipid or cholesterol levels are high, you are over 40, or you are at high risk for heart disease, you may need your cholesterol levels checked more frequently.Ongoing high lipid and cholesterol levels should be treated with medicines if diet and exercise are not working.   If you smoke, find out from your health care provider how to quit. If you do not use tobacco, do not start.   Lung cancer screening is recommended for adults aged 55-80 years who are at high risk for developing lung cancer because of a history of smoking. A yearly low-dose CT scan of the lungs is recommended for people who have at least a 30-pack-year history of smoking and are a current smoker or have quit within the past 15 years. A pack year of smoking is smoking an average of 1 pack of cigarettes a day for 1 year (for example: 1 pack a day for 30 years or 2 packs a day for 15 years). Yearly screening should continue until the smoker has stopped smoking for at least 15 years. Yearly screening should be stopped for people who develop a   health problem that would prevent them from having lung cancer treatment.   If you are pregnant, do not drink alcohol. If you are breastfeeding, be very cautious about drinking alcohol. If you are not pregnant and choose to drink alcohol, do not have more than 1 drink per day. One drink is considered to be 12 ounces (355 mL) of beer, 5 ounces (148 mL) of wine, or 1.5 ounces (44 mL) of liquor.   Avoid use of street drugs. Do not share needles with anyone. Ask for help if you need support or instructions about stopping the use of  drugs.   High blood pressure causes heart disease and increases the risk of stroke. Your blood pressure should be checked at least yearly.  Ongoing high blood pressure should be treated with medicines if weight loss and exercise do not work.   If you are 69-55 years old, ask your health care provider if you should take aspirin to prevent strokes.   Diabetes screening involves taking a blood sample to check your fasting blood sugar level. This should be done once every 3 years, after age 38, if you are within normal weight and without risk factors for diabetes. Testing should be considered at a younger age or be carried out more frequently if you are overweight and have at least 1 risk factor for diabetes.   Breast cancer screening is essential preventive care for women. You should practice "breast self-awareness."  This means understanding the normal appearance and feel of your breasts and may include breast self-examination.  Any changes detected, no matter how small, should be reported to a health care provider.  Women in their 80s and 30s should have a clinical breast exam (CBE) by a health care provider as part of a regular health exam every 1 to 3 years.  After age 66, women should have a CBE every year.  Starting at age 1, women should consider having a mammogram (breast X-ray test) every year.  Women who have a family history of breast cancer should talk to their health care provider about genetic screening.  Women at a high risk of breast cancer should talk to their health care providers about having an MRI and a mammogram every year.   -Breast cancer gene (BRCA)-related cancer risk assessment is recommended for women who have family members with BRCA-related cancers. BRCA-related cancers include breast, ovarian, tubal, and peritoneal cancers. Having family members with these cancers may be associated with an increased risk for harmful changes (mutations) in the breast cancer genes BRCA1 and  BRCA2. Results of the assessment will determine the need for genetic counseling and BRCA1 and BRCA2 testing.   The Pap test is a screening test for cervical cancer. A Pap test can show cell changes on the cervix that might become cervical cancer if left untreated. A Pap test is a procedure in which cells are obtained and examined from the lower end of the uterus (cervix).   - Women should have a Pap test starting at age 57.   - Between ages 90 and 70, Pap tests should be repeated every 2 years.   - Beginning at age 63, you should have a Pap test every 3 years as long as the past 3 Pap tests have been normal.   - Some women have medical problems that increase the chance of getting cervical cancer. Talk to your health care provider about these problems. It is especially important to talk to your health care provider if a  new problem develops soon after your last Pap test. In these cases, your health care provider may recommend more frequent screening and Pap tests.   - The above recommendations are the same for women who have or have not gotten the vaccine for human papillomavirus (HPV).   - If you had a hysterectomy for a problem that was not cancer or a condition that could lead to cancer, then you no longer need Pap tests. Even if you no longer need a Pap test, a regular exam is a good idea to make sure no other problems are starting.   - If you are between ages 36 and 66 years, and you have had normal Pap tests going back 10 years, you no longer need Pap tests. Even if you no longer need a Pap test, a regular exam is a good idea to make sure no other problems are starting.   - If you have had past treatment for cervical cancer or a condition that could lead to cancer, you need Pap tests and screening for cancer for at least 20 years after your treatment.   - If Pap tests have been discontinued, risk factors (such as a new sexual partner) need to be reassessed to determine if screening should  be resumed.   - The HPV test is an additional test that may be used for cervical cancer screening. The HPV test looks for the virus that can cause the cell changes on the cervix. The cells collected during the Pap test can be tested for HPV. The HPV test could be used to screen women aged 70 years and older, and should be used in women of any age who have unclear Pap test results. After the age of 67, women should have HPV testing at the same frequency as a Pap test.   Colorectal cancer can be detected and often prevented. Most routine colorectal cancer screening begins at the age of 57 years and continues through age 26 years. However, your health care provider may recommend screening at an earlier age if you have risk factors for colon cancer. On a yearly basis, your health care provider may provide home test kits to check for hidden blood in the stool.  Use of a small camera at the end of a tube, to directly examine the colon (sigmoidoscopy or colonoscopy), can detect the earliest forms of colorectal cancer. Talk to your health care provider about this at age 23, when routine screening begins. Direct exam of the colon should be repeated every 5 -10 years through age 49 years, unless early forms of pre-cancerous polyps or small growths are found.   People who are at an increased risk for hepatitis B should be screened for this virus. You are considered at high risk for hepatitis B if:  -You were born in a country where hepatitis B occurs often. Talk with your health care provider about which countries are considered high risk.  - Your parents were born in a high-risk country and you have not received a shot to protect against hepatitis B (hepatitis B vaccine).  - You have HIV or AIDS.  - You use needles to inject street drugs.  - You live with, or have sex with, someone who has Hepatitis B.  - You get hemodialysis treatment.  - You take certain medicines for conditions like cancer, organ  transplantation, and autoimmune conditions.   Hepatitis C blood testing is recommended for all people born from [birth year] through 1965 and any individual  with known risks for hepatitis C.   Practice safe sex. Use condoms and avoid high-risk sexual practices to reduce the spread of sexually transmitted infections (STIs). STIs include gonorrhea, chlamydia, syphilis, trichomonas, herpes, HPV, and human immunodeficiency virus (HIV). Herpes, HIV, and HPV are viral illnesses that have no cure. They can result in disability, cancer, and death. Sexually active women aged 25 years and younger should be checked for chlamydia. Older women with new or multiple partners should also be tested for chlamydia. Testing for other STIs is recommended if you are sexually active and at increased risk.   Osteoporosis is a disease in which the bones lose minerals and strength with aging. This can result in serious bone fractures or breaks. The risk of osteoporosis can be identified using a bone density scan. Women ages 65 years and over and women at risk for fractures or osteoporosis should discuss screening with their health care providers. Ask your health care provider whether you should take a calcium supplement or vitamin D to There are also several preventive steps women can take to avoid osteoporosis and resulting fractures or to keep osteoporosis from worsening. -->Recommendations include:  Eat a balanced diet high in fruits, vegetables, calcium, and vitamins.  Get enough calcium. The recommended total intake of is 1,200 mg daily; for best absorption, if taking supplements, divide doses into 250-500 mg doses throughout the day. Of the two types of calcium, calcium carbonate is best absorbed when taken with food but calcium citrate can be taken on an empty stomach.  Get enough vitamin D. NAMS and the National Osteoporosis Foundation recommend at least 1,000 IU per day for women age 50 and over who are at risk of vitamin D  deficiency. Vitamin D deficiency can be caused by inadequate sun exposure (for example, those who live in northern latitudes).  Avoid alcohol and smoking. Heavy alcohol intake (more than 7 drinks per week) increases the risk of falls and hip fracture and women smokers tend to lose bone more rapidly and have lower bone mass than nonsmokers. Stopping smoking is one of the most important changes women can make to improve their health and decrease risk for disease.  Be physically active every day. Weight-bearing exercise (for example, fast walking, hiking, jogging, and weight training) may strengthen bones or slow the rate of bone loss that comes with aging. Balancing and muscle-strengthening exercises can reduce the risk of falling and fracture.  Consider therapeutic medications. Currently, several types of effective drugs are available. Healthcare providers can recommend the type most appropriate for each woman.  Eliminate environmental factors that may contribute to accidents. Falls cause nearly 90% of all osteoporotic fractures, so reducing this risk is an important bone-health strategy. Measures include ample lighting, removing obstructions to walking, using nonskid rugs on floors, and placing mats and/or grab bars in showers.  Be aware of medication side effects. Some common medicines make bones weaker. These include a type of steroid drug called glucocorticoids used for arthritis and asthma, some antiseizure drugs, certain sleeping pills, treatments for endometriosis, and some cancer drugs. An overactive thyroid gland or using too much thyroid hormone for an underactive thyroid can also be a problem. If you are taking these medicines, talk to your doctor about what you can do to help protect your bones.reduce the rate of osteoporosis.    Menopause can be associated with physical symptoms and risks. Hormone replacement therapy is available to decrease symptoms and risks. You should talk to your  health care provider   about whether hormone replacement therapy is right for you.   Use sunscreen. Apply sunscreen liberally and repeatedly throughout the day. You should seek shade when your shadow is shorter than you. Protect yourself by wearing long sleeves, pants, a wide-brimmed hat, and sunglasses year round, whenever you are outdoors.   Once a month, do a whole body skin exam, using a mirror to look at the skin on your back. Tell your health care provider of new moles, moles that have irregular borders, moles that are larger than a pencil eraser, or moles that have changed in shape or color.   -Stay current with required vaccines (immunizations).   Influenza vaccine. All adults should be immunized every year.  Tetanus, diphtheria, and acellular pertussis (Td, Tdap) vaccine. Pregnant women should receive 1 dose of Tdap vaccine during each pregnancy. The dose should be obtained regardless of the length of time since the last dose. Immunization is preferred during the 27th 36th week of gestation. An adult who has not previously received Tdap or who does not know her vaccine status should receive 1 dose of Tdap. This initial dose should be followed by tetanus and diphtheria toxoids (Td) booster doses every 10 years. Adults with an unknown or incomplete history of completing a 3-dose immunization series with Td-containing vaccines should begin or complete a primary immunization series including a Tdap dose. Adults should receive a Td booster every 10 years.  Varicella vaccine. An adult without evidence of immunity to varicella should receive 2 doses or a second dose if she has previously received 1 dose. Pregnant females who do not have evidence of immunity should receive the first dose after pregnancy. This first dose should be obtained before leaving the health care facility. The second dose should be obtained 4 8 weeks after the first dose.  Human papillomavirus (HPV) vaccine. Females aged 13 26  years who have not received the vaccine previously should obtain the 3-dose series. The vaccine is not recommended for use in pregnant females. However, pregnancy testing is not needed before receiving a dose. If a female is found to be pregnant after receiving a dose, no treatment is needed. In that case, the remaining doses should be delayed until after the pregnancy. Immunization is recommended for any person with an immunocompromised condition through the age of 26 years if she did not get any or all doses earlier. During the 3-dose series, the second dose should be obtained 4 8 weeks after the first dose. The third dose should be obtained 24 weeks after the first dose and 16 weeks after the second dose.  Zoster vaccine. One dose is recommended for adults aged 60 years or older unless certain conditions are present.  Measles, mumps, and rubella (MMR) vaccine. Adults born before 1957 generally are considered immune to measles and mumps. Adults born in 1957 or later should have 1 or more doses of MMR vaccine unless there is a contraindication to the vaccine or there is laboratory evidence of immunity to each of the three diseases. A routine second dose of MMR vaccine should be obtained at least 28 days after the first dose for students attending postsecondary schools, health care workers, or international travelers. People who received inactivated measles vaccine or an unknown type of measles vaccine during 1963 1967 should receive 2 doses of MMR vaccine. People who received inactivated mumps vaccine or an unknown type of mumps vaccine before 1979 and are at high risk for mumps infection should consider immunization with 2 doses of   MMR vaccine. For females of childbearing age, rubella immunity should be determined. If there is no evidence of immunity, females who are not pregnant should be vaccinated. If there is no evidence of immunity, females who are pregnant should delay immunization until after pregnancy.  Unvaccinated health care workers born before [birth year] who lack laboratory evidence of measles, mumps, or rubella immunity or laboratory confirmation of disease should consider measles and mumps immunization with 2 doses of MMR vaccine or rubella immunization with 1 dose of MMR vaccine.  Pneumococcal 13-valent conjugate (PCV13) vaccine. When indicated, a person who is uncertain of her immunization history and has no record of immunization should receive the PCV13 vaccine. An adult aged 54 years or older who has certain medical conditions and has not been previously immunized should receive 1 dose of PCV13 vaccine. This PCV13 should be followed with a dose of pneumococcal polysaccharide (PPSV23) vaccine. The PPSV23 vaccine dose should be obtained at least 8 weeks after the dose of PCV13 vaccine. An adult aged 58 years or older who has certain medical conditions and previously received 1 or more doses of PPSV23 vaccine should receive 1 dose of PCV13. The PCV13 vaccine dose should be obtained 1 or more years after the last PPSV23 vaccine dose.  Pneumococcal polysaccharide (PPSV23) vaccine. When PCV13 is also indicated, PCV13 should be obtained first. All adults aged 58 years and older should be immunized. An adult younger than age 65 years who has certain medical conditions should be immunized. Any person who resides in a nursing home or long-term care facility should be immunized. An adult smoker should be immunized. People with an immunocompromised condition and certain other conditions should receive both PCV13 and PPSV23 vaccines. People with human immunodeficiency virus (HIV) infection should be immunized as soon as possible after diagnosis. Immunization during chemotherapy or radiation therapy should be avoided. Routine use of PPSV23 vaccine is not recommended for American Indians, [place] Natives, or people younger than 65 years unless there are medical conditions that require PPSV23 vaccine. When indicated,  people who have unknown immunization and have no record of immunization should receive PPSV23 vaccine. One-time revaccination 5 years after the first dose of PPSV23 is recommended for people aged 70 64 years who have chronic kidney failure, nephrotic syndrome, asplenia, or immunocompromised conditions. People who received 1 2 doses of PPSV23 before age 32 years should receive another dose of PPSV23 vaccine at age 96 years or later if at least 5 years have passed since the previous dose. Doses of PPSV23 are not needed for people immunized with PPSV23 at or after age 55 years.  Meningococcal vaccine. Adults with asplenia or persistent complement component deficiencies should receive 2 doses of quadrivalent meningococcal conjugate (MenACWY-D) vaccine. The doses should be obtained at least 2 months apart. Microbiologists working with certain meningococcal bacteria, [employer] recruits, people at risk during an outbreak, and people who travel to or live in countries with a high rate of meningitis should be immunized. A first-year college student up through age 58 years who is living in a residence hall should receive a dose if she did not receive a dose on or after her 16th birthday. Adults who have certain high-risk conditions should receive one or more doses of vaccine.  Hepatitis A vaccine. Adults who wish to be protected from this disease, have certain high-risk conditions, work with hepatitis A-infected animals, work in hepatitis A research labs, or travel to or work in countries with a high rate of hepatitis A should be  immunized. Adults who were previously unvaccinated and who anticipate close contact with an international adoptee during the first 60 days after arrival in the [place] from a country with a high rate of hepatitis A should be immunized.  Hepatitis B vaccine.  Adults who wish to be protected from this disease, have certain high-risk conditions, may be exposed to blood or other infectious  body fluids, are household contacts or sex partners of hepatitis B positive people, are clients or workers in certain care facilities, or travel to or work in countries with a high rate of hepatitis B should be immunized.  Haemophilus influenzae type b (Hib) vaccine. A previously unvaccinated person with asplenia or sickle cell disease or having a scheduled splenectomy should receive 1 dose of Hib vaccine. Regardless of previous immunization, a recipient of a hematopoietic stem cell transplant should receive a 3-dose series 6 12 months after her successful transplant. Hib vaccine is not recommended for adults with HIV infection.  Preventive Services / Frequency Ages 6 to 39years  Blood pressure check.** / Every 1 to 2 years.  Lipid and cholesterol check.** / Every 5 years beginning at age 39.  Clinical breast exam.** / Every 3 years for women in their 61s and 62s.  BRCA-related cancer risk assessment.** / For women who have family members with a BRCA-related cancer (breast, ovarian, tubal, or peritoneal cancers).  Pap test.** / Every 2 years from ages 47 through 85. Every 3 years starting at age 34 through age 12 or 74 with a history of 3 consecutive normal Pap tests.  HPV screening.** / Every 3 years from ages 46 through ages 43 to 54 with a history of 3 consecutive normal Pap tests.  Hepatitis C blood test.** / For any individual with known risks for hepatitis C.  Skin self-exam. / Monthly.  Influenza vaccine. / Every year.  Tetanus, diphtheria, and acellular pertussis (Tdap, Td) vaccine.** / Consult your health care provider. Pregnant women should receive 1 dose of Tdap vaccine during each pregnancy. 1 dose of Td every 10 years.  Varicella vaccine.** / Consult your health care provider. Pregnant females who do not have evidence of immunity should receive the first dose after pregnancy.  HPV vaccine. / 3 doses over 6 months, if 64 and younger. The vaccine is not recommended for use in  pregnant females. However, pregnancy testing is not needed before receiving a dose.  Measles, mumps, rubella (MMR) vaccine.** / You need at least 1 dose of MMR if you were born in 1957 or later. You may also need a 2nd dose. For females of childbearing age, rubella immunity should be determined. If there is no evidence of immunity, females who are not pregnant should be vaccinated. If there is no evidence of immunity, females who are pregnant should delay immunization until after pregnancy.  Pneumococcal 13-valent conjugate (PCV13) vaccine.** / Consult your health care provider.  Pneumococcal polysaccharide (PPSV23) vaccine.** / 1 to 2 doses if you smoke cigarettes or if you have certain conditions.  Meningococcal vaccine.** / 1 dose if you are age 71 to 37 years and a Market researcher living in a residence hall, or have one of several medical conditions, you need to get vaccinated against meningococcal disease. You may also need additional booster doses.  Hepatitis A vaccine.** / Consult your health care provider.  Hepatitis B vaccine.** / Consult your health care provider.  Haemophilus influenzae type b (Hib) vaccine.** / Consult your health care provider.  Ages 55 to 64years  Blood pressure check.** / Every 1 to 2 years.  Lipid and cholesterol check.** / Every 5 years beginning at age 20 years.  Lung cancer screening. / Every year if you are aged 55 80 years and have a 30-pack-year history of smoking and currently smoke or have quit within the past 15 years. Yearly screening is stopped once you have quit smoking for at least 15 years or develop a health problem that would prevent you from having lung cancer treatment.  Clinical breast exam.** / Every year after age 40 years.  BRCA-related cancer risk assessment.** / For women who have family members with a BRCA-related cancer (breast, ovarian, tubal, or peritoneal cancers).  Mammogram.** / Every year beginning at age 40  years and continuing for as long as you are in good health. Consult with your health care provider.  Pap test.** / Every 3 years starting at age 30 years through age 65 or 70 years with a history of 3 consecutive normal Pap tests.  HPV screening.** / Every 3 years from ages 30 years through ages 65 to 70 years with a history of 3 consecutive normal Pap tests.  Fecal occult blood test (FOBT) of stool. / Every year beginning at age 50 years and continuing until age 75 years. You may not need to do this test if you get a colonoscopy every 10 years.  Flexible sigmoidoscopy or colonoscopy.** / Every 5 years for a flexible sigmoidoscopy or every 10 years for a colonoscopy beginning at age 50 years and continuing until age 75 years.  Hepatitis C blood test.** / For all people born from 1945 through 1965 and any individual with known risks for hepatitis C.  Skin self-exam. / Monthly.  Influenza vaccine. / Every year.  Tetanus, diphtheria, and acellular pertussis (Tdap/Td) vaccine.** / Consult your health care provider. Pregnant women should receive 1 dose of Tdap vaccine during each pregnancy. 1 dose of Td every 10 years.  Varicella vaccine.** / Consult your health care provider. Pregnant females who do not have evidence of immunity should receive the first dose after pregnancy.  Zoster vaccine.** / 1 dose for adults aged 60 years or older.  Measles, mumps, rubella (MMR) vaccine.** / You need at least 1 dose of MMR if you were born in 1957 or later. You may also need a 2nd dose. For females of childbearing age, rubella immunity should be determined. If there is no evidence of immunity, females who are not pregnant should be vaccinated. If there is no evidence of immunity, females who are pregnant should delay immunization until after pregnancy.  Pneumococcal 13-valent conjugate (PCV13) vaccine.** / Consult your health care provider.  Pneumococcal polysaccharide (PPSV23) vaccine.** / 1 to 2 doses if  you smoke cigarettes or if you have certain conditions.  Meningococcal vaccine.** / Consult your health care provider.  Hepatitis A vaccine.** / Consult your health care provider.  Hepatitis B vaccine.** / Consult your health care provider.  Haemophilus influenzae type b (Hib) vaccine.** / Consult your health care provider.  Ages 65 years and over  Blood pressure check.** / Every 1 to 2 years.  Lipid and cholesterol check.** / Every 5 years beginning at age 20 years.  Lung cancer screening. / Every year if you are aged 55 80 years and have a 30-pack-year history of smoking and currently smoke or have quit within the past 15 years. Yearly screening is stopped once you have quit smoking for at least 15 years or develop a health problem that   would prevent you from having lung cancer treatment.  Clinical breast exam.** / Every year after age 103 years.  BRCA-related cancer risk assessment.** / For women who have family members with a BRCA-related cancer (breast, ovarian, tubal, or peritoneal cancers).  Mammogram.** / Every year beginning at age 36 years and continuing for as long as you are in good health. Consult with your health care provider.  Pap test.** / Every 3 years starting at age 5 years through age 85 or 10 years with 3 consecutive normal Pap tests. Testing can be stopped between 65 and 70 years with 3 consecutive normal Pap tests and no abnormal Pap or HPV tests in the past 10 years.  HPV screening.** / Every 3 years from ages 93 years through ages 70 or 45 years with a history of 3 consecutive normal Pap tests. Testing can be stopped between 65 and 70 years with 3 consecutive normal Pap tests and no abnormal Pap or HPV tests in the past 10 years.  Fecal occult blood test (FOBT) of stool. / Every year beginning at age 8 years and continuing until age 45 years. You may not need to do this test if you get a colonoscopy every 10 years.  Flexible sigmoidoscopy or colonoscopy.** /  Every 5 years for a flexible sigmoidoscopy or every 10 years for a colonoscopy beginning at age 69 years and continuing until age 68 years.  Hepatitis C blood test.** / For all people born from [birth year] through 1963 and any individual with known risks for hepatitis C.  Osteoporosis screening.** / A one-time screening for women ages 7 years and over and women at risk for fractures or osteoporosis.  Skin self-exam. / Monthly.  Influenza vaccine. / Every year.  Tetanus, diphtheria, and acellular pertussis (Tdap/Td) vaccine.** / 1 dose of Td every 10 years.  Varicella vaccine.** / Consult your health care provider.  Zoster vaccine.** / 1 dose for adults aged 5 years or older.  Pneumococcal 13-valent conjugate (PCV13) vaccine.** / Consult your health care provider.  Pneumococcal polysaccharide (PPSV23) vaccine.** / 1 dose for all adults aged 74 years and older.  Meningococcal vaccine.** / Consult your health care provider.  Hepatitis A vaccine.** / Consult your health care provider.  Hepatitis B vaccine.** / Consult your health care provider.  Haemophilus influenzae type b (Hib) vaccine.** / Consult your health care provider. ** Family history and personal history of risk and conditions may change your health care provider's recommendations. Document Released: 02/07/2002 Document Revised: 10/02/2013  [facility] Patient Information 2014 [place], [place].   EXERCISE AND DIET:  We recommended that you start or continue a regular exercise program for good health. Regular exercise means any activity that makes your heart beat faster and makes you sweat.  We recommend exercising at least 30 minutes per day at least 3 days a week, preferably 5.  We also recommend a diet low in fat and sugar / carbohydrates.  Inactivity, poor dietary choices and obesity can cause diabetes, heart attack, stroke, and kidney damage, among others.     ALCOHOL AND SMOKING:  Women should limit their alcohol intake to no  more than 7 drinks/beers/glasses of wine (combined, not each!) per week. Moderation of alcohol intake to this level decreases your risk of breast cancer and liver damage.  ( And of course, no recreational drugs are part of a healthy lifestyle.)  Also, you should not be smoking at all or even being exposed to second hand smoke. Most people know smoking can  cause cancer, and various heart and lung diseases, but did you know it also contributes to weakening of your bones?  Aging of your skin?  Yellowing of your teeth and nails?   CALCIUM AND VITAMIN D:  Adequate intake of calcium and Vitamin D are recommended.  The recommendations for exact amounts of these supplements seem to change often, but generally speaking 600 mg of calcium (either carbonate or citrate) and 800 units of Vitamin D per day seems prudent. Certain women may benefit from higher intake of Vitamin D.  If you are among these women, your doctor will have told you during your visit.     PAP SMEARS:  Pap smears, to check for cervical cancer or precancers,  have traditionally been done yearly, although recent scientific advances have shown that most women can have pap smears less often.  However, every woman still should have a physical exam from her gynecologist or primary care physician every year. It will include a breast check, inspection of the vulva and vagina to check for abnormal growths or skin changes, a visual exam of the cervix, and then an exam to evaluate the size and shape of the uterus and ovaries.  And after 26 years of age, a rectal exam is indicated to check for rectal cancers. We will also provide age appropriate advice regarding health maintenance, like when you should have certain vaccines, screening for sexually transmitted diseases, bone density testing, colonoscopy, mammograms, etc.    MAMMOGRAMS:  All women over 65 years old should have a yearly mammogram. Many facilities now offer a "3D" mammogram, which may cost  around $50 extra out of pocket. If possible,  we recommend you accept the option to have the 3D mammogram performed.  It both reduces the number of women who will be called back for extra views which then turn out to be normal, and it is better than the routine mammogram at detecting truly abnormal areas.     COLONOSCOPY:  Colonoscopy to screen for colon cancer is recommended for all women at age 26.  We know, you hate the idea of the prep.  We agree, BUT, having colon cancer and not knowing it is worse!!  Colon cancer so often starts as a polyp that can be seen and removed at colonscopy, which can quite literally save your life!  And if your first colonoscopy is normal and you have no family history of colon cancer, most women don't have to have it again for 10 years.  Once every ten years, you can do something that may end up saving your life, right?  We will be happy to help you get it scheduled when you are ready.  Be sure to check your insurance coverage so you understand how much it will cost.  It may be covered as a preventative service at no cost, but you should check your particular policy.    Mediterranean Diet A Mediterranean diet refers to food and lifestyle choices that are based on the traditions of countries located on the [employer]. This way of eating has been shown to help prevent certain conditions and improve outcomes for people who have chronic diseases, like kidney disease and heart disease. What are tips for following this plan? Lifestyle  Cook and eat meals together with your family, when possible.  Drink enough fluid to keep your urine clear or pale yellow.  Be physically active every day. This includes: ? Aerobic exercise like running or swimming. ? Leisure activities like  gardening, walking, or housework.  Get 7-8 hours of sleep each night.  If recommended by your health care provider, drink red wine in moderation. This means 1 glass a day for nonpregnant women  and 2 glasses a day for men. A glass of wine equals 5 oz (150 mL). Reading food labels   Check the serving size of packaged foods. For foods such as rice and pasta, the serving size refers to the amount of cooked product, not dry.  Check the total fat in packaged foods. Avoid foods that have saturated fat or trans fats.  Check the ingredients list for added sugars, such as corn syrup. Shopping  At the grocery store, buy most of your food from the areas near the walls of the store. This includes: ? Fresh fruits and vegetables (produce). ? Grains, beans, nuts, and seeds. Some of these may be available in unpackaged forms or large amounts (in bulk). ? Fresh seafood. ? Poultry and eggs. ? Low-fat dairy products.  Buy whole ingredients instead of prepackaged foods.  Buy fresh fruits and vegetables in-season from local farmers markets.  Buy frozen fruits and vegetables in resealable bags.  If you do not have access to quality fresh seafood, buy precooked frozen shrimp or canned fish, such as tuna, salmon, or sardines.  Buy small amounts of raw or cooked vegetables, salads, or olives from the deli or salad bar at your store.  Stock your pantry so you always have certain foods on hand, such as olive oil, canned tuna, canned tomatoes, rice, pasta, and beans. Cooking  Cook foods with extra-virgin olive oil instead of using butter or other vegetable oils.  Have meat as a side dish, and have vegetables or grains as your main dish. This means having meat in small portions or adding small amounts of meat to foods like pasta or stew.  Use beans or vegetables instead of meat in common dishes like chili or lasagna.  Experiment with different cooking methods. Try roasting or broiling vegetables instead of steaming or sauteing them.  Add frozen vegetables to soups, stews, pasta, or rice.  Add nuts or seeds for added healthy fat at each meal. You can add these to yogurt, salads, or vegetable  dishes.  Marinate fish or vegetables using olive oil, lemon juice, garlic, and fresh herbs. Meal planning   Plan to eat 1 vegetarian meal one day each week. Try to work up to 2 vegetarian meals, if possible.  Eat seafood 2 or more times a week.  Have healthy snacks readily available, such as: ? Vegetable sticks with hummus. ? [place] yogurt. ? Fruit and nut trail mix.  Eat balanced meals throughout the week. This includes: ? Fruit: 2-3 servings a day ? Vegetables: 4-5 servings a day ? Low-fat dairy: 2 servings a day ? Fish, poultry, or lean meat: 1 serving a day ? Beans and legumes: 2 or more servings a week ? Nuts and seeds: 1-2 servings a day ? Whole grains: 6-8 servings a day ? Extra-virgin olive oil: 3-4 servings a day  Limit red meat and sweets to only a few servings a month What are my food choices?  Mediterranean diet ? Recommended ? Grains: Whole-grain pasta. Brown rice. Bulgar wheat. Polenta. Couscous. Whole-wheat bread. [place]. ? Vegetables: Artichokes. Beets. Broccoli. Cabbage. Carrots. Eggplant. Green beans. Chard. Kale. Spinach. Onions. Leeks. Peas. Squash. Tomatoes. Peppers. Radishes. ? Fruits: Apples. Apricots. Avocado. Berries. Bananas. Cherries. Dates. Figs. Grapes. Lemons. Melon. Oranges. Peaches. Plums. Pomegranate. ? Meats and  other protein foods: Beans. Almonds. Sunflower seeds. Pine nuts. Peanuts. [place]. Salmon. Scallops. Shrimp. [place]. Tilapia. Clams. Oysters. Eggs. ? Dairy: Low-fat milk. Cheese. Greek yogurt. ? Beverages: Water. Red wine. Herbal tea. ? Fats and oils: Extra virgin olive oil. Avocado oil. Grape seed oil. ? Sweets and desserts: [place] yogurt with honey. Baked apples. Poached pears. Trail mix. ? Seasoning and other foods: Basil. Cilantro. Coriander. Cumin. Mint. Parsley. Sage. Rosemary. Tarragon. Garlic. Oregano. Thyme. Pepper. Balsalmic vinegar. Tahini. Hummus. Tomato sauce. Olives. Mushrooms. ? Limit these ? Grains: Prepackaged pasta  or rice dishes. Prepackaged cereal with added sugar. ? Vegetables: Deep fried potatoes (french fries). ? Fruits: Fruit canned in syrup. ? Meats and other protein foods: Beef. Pork. Lamb. Poultry with skin. Hot dogs. [place] Salines. ? Dairy: Ice cream. Sour cream. Whole milk. ? Beverages: Juice. Sugar-sweetened soft drinks. Beer. Liquor and spirits. ? Fats and oils: Butter. Canola oil. Vegetable oil. Beef fat (tallow). Lard. ? Sweets and desserts: Cookies. Cakes. Pies. Candy. ? Seasoning and other foods: Mayonnaise. Premade sauces and marinades. ? The items listed may not be a complete list. Talk with your dietitian about what dietary choices are right for you. Summary  The Mediterranean diet includes both food and lifestyle choices.  Eat a variety of fresh fruits and vegetables, beans, nuts, seeds, and whole grains.  Limit the amount of red meat and sweets that you eat.  Talk with your health care provider about whether it is safe for you to drink red wine in moderation. This means 1 glass a day for nonpregnant women and 2 glasses a day for men. A glass of wine equals 5 oz (150 mL). This information is not intended to replace advice given to you by your health care provider. Make sure you discuss any questions you have with your health care provider. Document Released: 08/04/2016 Document Revised: 09/06/2016 Document Reviewed: 08/04/2016 Elsevier Interactive Patient Education  2019 [employer].   Overall your labs looks good with just minor elevation in A1c (average of blood sugar last 90 days) A1c-5.8- pre-diabetic.  To reduce, please reduce carbohydrate and sugar intake. LDL (bad) cholesterol is very slightly elevated, normal <99 Just reduce saturated fat and increase regular exercise. Recommend at least 30 minutes daily, 5 days per week of walking, jogging, biking, swimming, YouTube/Pinterest workout videos. Emmit Alexanders on house build! Recommend annual physical, fasting labs the week  prior. GREAT TO SEE YOU!

## 2019-04-29 MED FILL — ETONOGESTREL-ETHINYL ESTRAD: 0.12-0.015 | 84 days supply | Qty: 3 | Fill #1

## 2019-06-20 NOTE — Progress Notes (Signed)
Virtual Visit via Telephone Note  I connected with Madison Cooper on 06/24/2019 at  9:45 AM EDT by telephone and verified that I am speaking with the correct person using two identifiers.  Location: Patient: Home Provider: In Clinic   I discussed the limitations, risks, security and privacy concerns of performing an evaluation and management service by telephone and the availability of in person appointments. I also discussed with the patient that there may be a patient responsible charge related to this service. The patient expressed understanding and agreed to proceed.   History of Present Illness: Ms. Madison Cooper calls in today with concerns increase in anxiety an depression since late May (Anxiety >Depression). Only recent life change- she was married 27 May 2019 via [employer]. She reports being very happy about being married and that "the courthouse wedding what we were planning all along". She reports being "tearful, down, and worrying a lot more than usual", the last few months. She reports she has been unable to take any time off worm since Feb 2020. 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 She works day shift, 12 hr shifts, 3 shifts per weeks with at least one on call shift per week (but has not been called in once). She reports all her time off has been denied due to high pt census and PRN staff not being permitted to work due to COVID-19 pandemic financial constraints on the [employer] [facility] system. She reports strong support system- husband, family, and friends. 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 She vehemently denies SI/HI She has never been on medication for anxiety/depression. Last contact with therapist was during her parents divorce >15 years ago. She reports walking 2- 3.5 miles/day on her days off. She reports consuming 3 alcoholic drinks/week,which is an increase from 1  drink/month. She continues to abstain from tobacco/vape use. She reports increase in fatigue despite "sleeping a lot". She reports increase in BMs, typically she has 1-2 BM in 1-2 days, now she has 5 well, formed small BMs/day. She denies abdominal pain or hematochezia.    Patient Care Team    Relationship Specialty Notifications Start End  Madison Cooper, Madison Jobst D, NP PCP - General Family Medicine  01/22/19   Madison Cooper, Kyle, MD Consulting Physician Neurosurgery  01/22/19   Madison Cooper, Vaishali, MD Consulting Physician Obstetrics and Gynecology  01/22/19   Madison Cooper, Madison Cooper, OD Referring Physician Optometry  01/22/19     Patient Active Problem List   Diagnosis Date Noted  . Contact dermatitis 02/05/2019  . Acute hip pain, left 02/05/2019  . Healthcare maintenance 01/22/2019  . Spondylolisthesis 01/22/2019  . BMI 38.0-38.9,adult 01/22/2019     Past Medical History:  Diagnosis Date  . Allergy   . Spondylisthesis      Past Surgical History:  Procedure Laterality Date  . ANKLE SURGERY Left 2010  . MANDIBLE SURGERY  11/15/12  . TONSILLECTOMY AND ADENOIDECTOMY  1/12  . TYMPANOSTOMY TUBE PLACEMENT    . WISDOM TOOTH EXTRACTION  12/11     Family History  Problem Relation Age of Onset  . Cancer Mother        basal cell  . Kidney disease Mother        kidney stones  . Alcohol abuse Father   . Heart attack Father   . Gout Father   . Alcohol abuse Brother   . Depression Brother   . Heart  disease Maternal Grandfather   . Diabetes Maternal Grandfather   . Heart attack Maternal Grandfather   . Hypertension Maternal Grandfather   . Heart disease Paternal Grandfather   . Diabetes Paternal Grandfather   . Cancer Paternal Grandfather        lymphoma  . Hypothyroidism Maternal Grandmother   . Alzheimer's disease Paternal Grandmother   . Parkinson's disease Paternal Grandmother   . Scoliosis Paternal Grandmother      Social History   Substance and Sexual Activity  Drug Use No      Social History   Substance and Sexual Activity  Alcohol Use No  . Alcohol/week: 0.0 standard drinks   Comment: Less than 1      Social History   Tobacco Use  Smoking Status Never Smoker  Smokeless Tobacco Never Used     Outpatient Encounter Medications as of 06/24/2019  Medication Sig  . acetaminophen (TYLENOL) 325 MG tablet Take 650 mg by mouth every 6 (six) hours as needed.  . etonogestrel-ethinyl estradiol (NUVARING) 0.12-0.015 MG/24HR vaginal ring Use for 3 weeks, then remove for 1 week.  . meloxicam (MOBIC) 15 MG tablet Take 15 mg by mouth daily as needed for pain.  . Misc Natural Products (CRANBERRY/PROBIOTIC PO) Take 1 tablet by mouth daily.   No facility-administered encounter medications on file as of 06/24/2019.     Allergies: Diclofenac, Adhesive [tape], and Sulfa antibiotics  There is no height or weight on file to calculate BMI.  There were no vitals taken for this visit. Review of Systems: General:   Denies fever, chills, unexplained weight loss.  Optho/Auditory:   Denies visual changes, blurred vision/LOV Respiratory:   Denies SOB, DOE more than baseline levels.  Cardiovascular:   Denies chest pain, palpitations, new onset peripheral edema  Gastrointestinal:   Denies nausea, vomiting, diarrhea.  Genitourinary: Denies dysuria, freq/ urgency, flank pain or discharge from genitals.  Endocrine:     Denies hot or cold intolerance, polyuria, polydipsia. Musculoskeletal:   Denies unexplained myalgias, joint swelling, unexplained arthralgias, gait problems.  Skin:  Denies rash, suspicious lesions Neurological:     Denies dizziness, unexplained weakness, numbness  Psychiatric/Behavioral:  mood changes +, Anxiety + , Denies suicidal or homicidal ideations, hallucinations This patient does not have sx concerning for COVID-19 Infection (ie; fever, chills, cough, new or worsening shortness of breath).    Observations/Objective: Pt was crying on/off during  telephone conversation.  Assessment and Plan: Start escitalopram (Lexapro) 10mg  QD Continue to exercise, follow heart healthy diet, reduce ETOH use. Referral placed to BH/psycology Continue to request time off. If you develop thoughts of harming yourself/others- seek immediate medical assistance- pt verbalized understanding/agreement. Continue to social distance and when out of home, wear a mask.  Follow Up Instructions: 4 weeks TeleMedicine, sooner if needed.   I discussed the assessment and treatment plan with the patient. The patient was provided an opportunity to ask questions and all were answered. The patient agreed with the plan and demonstrated an understanding of the instructions.   The patient was advised to call back or seek an in-person evaluation if the symptoms worsen or if the condition fails to improve as anticipated.  I provided 25 minutes of non-face-to-face time during this encounter.   Esaw Grandchild, NP

## 2019-06-24 ENCOUNTER — Other Ambulatory Visit: Payer: Self-pay

## 2019-06-24 ENCOUNTER — Ambulatory Visit (INDEPENDENT_AMBULATORY_CARE_PROVIDER_SITE_OTHER): Payer: 59 | Admitting: Adult Health

## 2019-06-24 ENCOUNTER — Encounter: Payer: Self-pay | Admitting: Adult Health

## 2019-06-24 VITALS — BP 111/71 | HR 88 | Ht 69.25 in | Wt 262.0 lb

## 2019-06-24 DIAGNOSIS — F419 Anxiety disorder, unspecified: Secondary | ICD-10-CM

## 2019-06-24 DIAGNOSIS — F329 Major depressive disorder, single episode, unspecified: Secondary | ICD-10-CM | POA: Diagnosis not present

## 2019-06-24 DIAGNOSIS — F32A Depression, unspecified: Secondary | ICD-10-CM

## 2019-06-24 MED ORDER — ESCITALOPRAM OXALATE 10 MG PO TABS: 10.0000 mg | ORAL_TABLET | Freq: Every day | ORAL | 0 refills | Status: DC

## 2019-06-24 MED FILL — ESCITALOPRAM 10 MG TABLET: 10 | 60 days supply | Qty: 60 | Fill #0

## 2019-06-24 NOTE — Assessment & Plan Note (Signed)
Assessment and Plan: Start escitalopram (Lexapro) 10mg  QD Continue to exercise, follow heart healthy diet, reduce ETOH use. Referral placed to BH/psycology Continue to request time off. If you develop thoughts of harming yourself/others- seek immediate medical assistance- pt verbalized understanding/agreement. Continue to social distance and when out of home, wear a mask.  Follow Up Instructions: 4 weeks TeleMedicine, sooner if needed.   I discussed the assessment and treatment plan with the patient. The patient was provided an opportunity to ask questions and all were answered. The patient agreed with the plan and demonstrated an understanding of the instructions.   The patient was advised to call back or seek an in-person evaluation if the symptoms worsen or if the condition fails to improve as anticipated.

## 2019-07-18 NOTE — Progress Notes (Signed)
Virtual Visit via Telephone Note  I connected with Madison Cooper on 07/23/2019 at  8:45 AM EDT by telephone and verified that I am speaking with the correct person using two identifiers.  Location: Patient: Home Provider: In Clinic   I discussed the limitations, risks, security and privacy concerns of performing an evaluation and management service by telephone and the availability of in person appointments. I also discussed with the patient that there may be a patient responsible charge related to this service. The patient expressed understanding and agreed to proceed.   History of Present Illness: 06/24/2019 OV: Ms. Madison Cooper calls in today with concerns increase in anxiety an depression since late May (Anxiety >Depression). Only recent life change- she was married 27 May 2019 via [place]. She reports being very happy about being married and that "the courthouse wedding what we were planning all along". She reports being "tearful, down, and worrying a lot more than usual", the last few months. She reports she has been unable to take any time off worm since Feb 2020. She works as Therapist, sports in Therapist, music at [employer]. She works day shift, 12 hr shifts, 3 shifts per weeks with at least one on call shift per week (but has not been called in once). She reports all her time off has been denied due to high pt census and PRN staff not being permitted to work due to COVID-19 pandemic financial constraints on the [employer] system. She reports strong support system- husband, family, and friends. 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 She vehemently denies SI/HI She has never been on medication for anxiety/depression. Last contact with therapist was during her parents divorce >15 years ago. She reports walking 2- 3.5 miles/day on her days off. She reports consuming 3 alcoholic drinks/week,which is an  increase from 1 drink/month. She continues to abstain from tobacco/vape use. She reports increase in fatigue despite "sleeping a lot". She reports increase in BMs, typically she has 1-2 BM in 1-2 days, now she has 5 well, formed small BMs/day. She denies abdominal pain or hematochezia.  07/23/2019 OV: Ms Madison Cooper calls in today with f/u: anxiety/depression She reports dramatic reduction in "sadness and feelings of anxiousness" She has not cried in weeks. She reports improved sleep and has increased daily walking and yard work. 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- no other ETOH use during the week She has upcoming vacation this weekend to [facility]- one excursion is deep [place] fishing, she has had previous motion sickness issues- will send in scopolamine patch  She denies SI/HI She states "I just feel so much better"    Patient Care Team    Relationship Specialty Notifications Start End  Esaw Grandchild, NP PCP - General Family Medicine  01/22/19   Ashok Pall, MD Consulting Physician Neurosurgery  01/22/19   Azucena Fallen, MD Consulting Physician Obstetrics and Gynecology  01/22/19   Barbaraann Cao, [place] Referring Physician Optometry  01/22/19     Patient Active Problem List   Diagnosis Date Noted  . Anxiety and depression 06/24/2019  . Contact dermatitis 02/05/2019  . Acute hip pain, left 02/05/2019  . Healthcare maintenance 01/22/2019  . Spondylolisthesis 01/22/2019  . BMI 38.0-38.9,adult 01/22/2019     Past Medical History:  Diagnosis Date  . Allergy   . Spondylisthesis  Past Surgical History:  Procedure Laterality Date  . ANKLE SURGERY Left 2010  . MANDIBLE SURGERY  11/15/12  . TONSILLECTOMY AND ADENOIDECTOMY  1/12  . TYMPANOSTOMY TUBE PLACEMENT    . WISDOM TOOTH EXTRACTION  12/11     Family History  Problem Relation Age of Onset  . Cancer Mother         basal cell  . Kidney disease Mother        kidney stones  . Alcohol abuse Father   . Heart attack Father   . Gout Father   . Alcohol abuse Brother   . Depression Brother   . Heart disease Maternal Grandfather   . Diabetes Maternal Grandfather   . Heart attack Maternal Grandfather   . Hypertension Maternal Grandfather   . Heart disease Paternal Grandfather   . Diabetes Paternal Grandfather   . Cancer Paternal Grandfather        lymphoma  . Hypothyroidism Maternal Grandmother   . Alzheimer's disease Paternal Grandmother   . Parkinson's disease Paternal Grandmother   . Scoliosis Paternal Grandmother      Social History   Substance and Sexual Activity  Drug Use No     Social History   Substance and Sexual Activity  Alcohol Use No  . Alcohol/week: 0.0 standard drinks   Comment: Less than 1      Social History   Tobacco Use  Smoking Status Never Smoker  Smokeless Tobacco Never Used     Outpatient Encounter Medications as of 07/23/2019  Medication Sig  . acetaminophen (TYLENOL) 325 MG tablet Take 650 mg by mouth every 6 (six) hours as needed.  Marland Kitchen. escitalopram (LEXAPRO) 10 MG tablet Take 1 tablet (10 mg total) by mouth daily.  Marland Kitchen. etonogestrel-ethinyl estradiol (NUVARING) 0.12-0.015 MG/24HR vaginal ring Use for 3 weeks, then remove for 1 week.  . meloxicam (MOBIC) 15 MG tablet Take 15 mg by mouth daily as needed for pain.  . Misc Natural Products (CRANBERRY/PROBIOTIC PO) Take 1 tablet by mouth daily.  . Multiple Vitamins-Minerals (MULTIVITAMIN GUMMIES ADULT PO) Take 1 each by mouth daily.  . [DISCONTINUED] escitalopram (LEXAPRO) 10 MG tablet Take 1 tablet (10 mg total) by mouth daily.  Marland Kitchen. scopolamine (TRANSDERM-SCOP, 1.5 MG,) 1 MG/3DAYS Place 1 patch (1.5 mg total) onto the skin every 3 (three) days.   No facility-administered encounter medications on file as of 07/23/2019.     Allergies: Diclofenac, Adhesive [tape], and Sulfa antibiotics  There is no height or  weight on file to calculate BMI.  Last menstrual period 07/07/2019. Review of Systems: General:   Denies fever, chills, unexplained weight loss.  Optho/Auditory:   Denies visual changes, blurred vision/LOV Respiratory:   Denies SOB, DOE more than baseline levels.  Cardiovascular:   Denies chest pain, palpitations, new onset peripheral edema  Gastrointestinal:   Denies nausea, vomiting, diarrhea.  Genitourinary: Denies dysuria, freq/ urgency, flank pain or discharge from genitals.  Endocrine:     Denies hot or cold intolerance, polyuria, polydipsia. Musculoskeletal:   Denies unexplained myalgias, joint swelling, unexplained arthralgias, gait problems.  Skin:  Denies rash, suspicious lesions Neurological:     Denies dizziness, unexplained weakness, numbness  Psychiatric/Behavioral:   Denies mood changes, suicidal or homicidal ideations, hallucinations This patient does not have sx concerning for COVID-19 Infection (ie; fever, chills, cough, new or worsening shortness of breath).    Observations/Objective: No acute distress noted during the telephone conversation  Assessment and Plan: Continue Escitalopram 10mg  QD Continue to be as active  as possble Remain well hydrated, follow Mediterranean diet Continue to use ETOH sparingly Scopolamine patch for upcoming [place] fishing Trip- use as directed, remove once trip has completed Continue to social distance and wear a mask when out in public  Follow Up Instructions: PRN   I discussed the assessment and treatment plan with the patient. The patient was provided an opportunity to ask questions and all were answered. The patient agreed with the plan and demonstrated an understanding of the instructions.   The patient was advised to call back or seek an in-person evaluation if the symptoms worsen or if the condition fails to improve as anticipated.  I provided 15 minutes of non-face-to-face time during this encounter.   Julaine FusiKaty D Moustapha Tooker,  NP

## 2019-07-23 ENCOUNTER — Encounter: Payer: Self-pay | Admitting: Adult Health

## 2019-07-23 ENCOUNTER — Ambulatory Visit (INDEPENDENT_AMBULATORY_CARE_PROVIDER_SITE_OTHER): Payer: 59 | Admitting: Adult Health

## 2019-07-23 ENCOUNTER — Other Ambulatory Visit: Payer: Self-pay

## 2019-07-23 DIAGNOSIS — T753XXA Motion sickness, initial encounter: Secondary | ICD-10-CM | POA: Insufficient documentation

## 2019-07-23 DIAGNOSIS — F329 Major depressive disorder, single episode, unspecified: Secondary | ICD-10-CM | POA: Diagnosis not present

## 2019-07-23 DIAGNOSIS — Z0000 Encounter for general adult medical examination without abnormal findings: Secondary | ICD-10-CM

## 2019-07-23 DIAGNOSIS — F419 Anxiety disorder, unspecified: Secondary | ICD-10-CM

## 2019-07-23 DIAGNOSIS — F32A Depression, unspecified: Secondary | ICD-10-CM

## 2019-07-23 MED ORDER — ESCITALOPRAM OXALATE 10 MG PO TABS: 10.0000 mg | ORAL_TABLET | Freq: Every day | ORAL | 1 refills | Status: DC

## 2019-07-23 MED ORDER — SCOPOLAMINE 1 MG/3DAYS TD PT72: 1.0000 | MEDICATED_PATCH | TRANSDERMAL | 0 refills | Status: DC

## 2019-07-23 MED FILL — SCOPOLAMINE 1 MG/3DAYS PT72: 1 | 12 days supply | Qty: 4 | Fill #0

## 2019-07-23 NOTE — Assessment & Plan Note (Signed)
Assessment and Plan: Continue Escitalopram 10mg  QD Continue to be as active as possble Remain well hydrated, follow Mediterranean diet Continue to use ETOH sparingly   Follow Up Instructions: PRN   I discussed the assessment and treatment plan with the patient. The patient was provided an opportunity to ask questions and all were answered. The patient agreed with the plan and demonstrated an understanding of the instructions.

## 2019-07-23 NOTE — Assessment & Plan Note (Signed)
Continue to social distance and wear a mask when out in public. 

## 2019-07-23 NOTE — Assessment & Plan Note (Signed)
Scopolamine patch for upcoming [place] fishing Trip- use as directed, remove once trip has completed

## 2019-08-09 MED FILL — ETONOGESTREL-ETHINYL ESTRAD: 0.12-0.015 | 84 days supply | Qty: 3 | Fill #2

## 2019-08-22 ENCOUNTER — Encounter: Payer: Self-pay | Admitting: Adult Health

## 2019-08-22 ENCOUNTER — Telehealth: Payer: Self-pay | Admitting: Adult Health

## 2019-08-22 MED FILL — ESCITALOPRAM 10 MG TABLET: 10 | 90 days supply | Qty: 90 | Fill #0

## 2019-08-22 NOTE — Telephone Encounter (Signed)
Patient called states she took her last pill today of :   escitalopram (LEXAPRO) 10 MG tablet [[id number]]   Order Details Dose: 10 mg Route: Oral Frequency: Daily  Indications of Use: Generalized Anxiety Disorder  Dispense Quantity: 90 tablet Refills: 1 Fills remaining: --        Sig: Take 1 tablet (10 mg total) by mouth daily.     --- Advised pt that Provider had granted  (90day supply ) & ask did she received 90 pill/ plus (1) refill) & she should not be  Having to ask for a Refill-----she states was given only (30pill) but will contact the pharmacy regarding incorrect quantity given @ last pick up.  ---Forwarding message to medical assistant as Juluis Rainier in case pt calls back because [facility] will not issue any more Rx w/o a New Rx from provider.  --glh

## 2019-11-19 MED FILL — ESCITALOPRAM 10 MG TABLET: 10 | 90 days supply | Qty: 90 | Fill #1

## 2019-11-20 MED FILL — ETONOGESTREL-ETHINYL ESTRAD: 0.12-0.015 | 84 days supply | Qty: 3 | Fill #3

## 2020-02-28 ENCOUNTER — Ambulatory Visit
Admission: EM | Admit: 2020-02-28 | Discharge: 2020-02-28 | Disposition: A | Discharge status: Home / Self Care | Payer: No Typology Code available for payment source

## 2020-02-28 ENCOUNTER — Encounter: Payer: Self-pay | Admitting: Emergency Medicine

## 2020-02-28 ENCOUNTER — Other Ambulatory Visit: Payer: Self-pay

## 2020-02-28 DIAGNOSIS — M545 Low back pain, unspecified: Secondary | ICD-10-CM

## 2020-02-28 LAB — POCT URINE PREGNANCY: Preg Test, Ur: NEGATIVE

## 2020-02-28 MED ORDER — METHYLPREDNISOLONE SODIUM SUCC 125 MG IJ SOLR
80.0000 mg | Freq: Once | INTRAMUSCULAR | Status: AC
  Administered 2020-02-28: 80 mg via INTRAMUSCULAR

## 2020-02-28 MED ORDER — TIZANIDINE HCL 4 MG PO TABS: 4.0000 mg | ORAL_TABLET | Freq: Four times a day (QID) | ORAL | 0 refills | Status: DC | PRN

## 2020-02-28 MED ORDER — MELOXICAM 15 MG PO TABS: 15.0000 mg | ORAL_TABLET | Freq: Every day | ORAL | 0 refills | Status: DC | PRN

## 2020-02-28 MED FILL — MELOXICAM 15 MG TABLET: 15 | 30 days supply | Qty: 30 | Fill #0

## 2020-02-28 MED FILL — tiZANidine HCL 4 MG TABS: 4 | 5 days supply | Qty: 20 | Fill #0

## 2020-02-28 NOTE — ED Provider Notes (Signed)
[facility]    CSN: [id number] Arrival date & time: 02/28/20  0932      History   Chief Complaint Chief Complaint  Patient presents with  . Back Pain    HPI Madison Cooper is a 29 y.o. female.   29 year old female with history of congenital spondylolysis comes in for midline/left sided lumbar pain starting 5 days ago. Denies injury/trauma. She had been following with neurosurgery and was released due to good control. States at baseline has mild constant pain to the area that is dull. She has flare ups approx every other month where she start meloxicam 15mg  for 2-3 days and will resolve. States last flare up 3-4 months ago. However, 5 days ago, started having constant, sharp/stabbing pain that is worse with movement/changes in position. she has been taking meloxicam with mild relief until running out of medicines. She then switched to ibuprofen 600mg  q6h, tylenol 1000mg  q6h, heating with minimal relief.  Denies radiation of pain, leg weakness. Denies saddle anesthesia, loss of bladder or bowel control.      Past Medical History:  Diagnosis Date  . Allergy   . Spondylisthesis     Patient Active Problem List   Diagnosis Date Noted  . Motion sickness 07/23/2019  . Anxiety and depression 06/24/2019  . Contact dermatitis 02/05/2019  . Acute hip pain, left 02/05/2019  . Healthcare maintenance 01/22/2019  . Spondylolisthesis 01/22/2019  . BMI 38.0-38.9,adult 01/22/2019    Past Surgical History:  Procedure Laterality Date  . ANKLE SURGERY Left 2010  . MANDIBLE SURGERY  11/15/12  . TONSILLECTOMY AND ADENOIDECTOMY  1/12  . TYMPANOSTOMY TUBE PLACEMENT    . WISDOM TOOTH EXTRACTION  12/11    OB History    Gravida  0   Para  0   Term  0   Preterm  0   AB  0   Living  0     SAB  0   TAB  0   Ectopic  0   Multiple  0   Live Births  0            Home Medications    Prior to Admission medications   Medication Sig Start Date End Date  Taking? Authorizing Provider  loratadine (CLARITIN) 10 MG tablet Take 10 mg by mouth daily.   Yes [provider]  acetaminophen (TYLENOL) 325 MG tablet Take 650 mg by mouth every 6 (six) hours as needed.    [provider]  meloxicam (MOBIC) 15 MG tablet Take 1 tablet (15 mg total) by mouth daily as needed for pain. 02/28/20   Tasia Catchings, Maliea Grandmaison V, PA-C  Multiple Vitamins-Minerals (MULTIVITAMIN GUMMIES ADULT PO) Take 1 each by mouth daily.    [provider]  scopolamine (TRANSDERM-SCOP, 1.5 MG,) 1 MG/3DAYS Place 1 patch (1.5 mg total) onto the skin every 3 (three) days. 07/23/19   Danford, Valetta Fuller D, NP  tiZANidine (ZANAFLEX) 4 MG tablet Take 1 tablet (4 mg total) by mouth every 6 (six) hours as needed for muscle spasms. 02/28/20   Tasia Catchings, Haddie Bruhl V, PA-C  escitalopram (LEXAPRO) 10 MG tablet Take 1 tablet (10 mg total) by mouth daily. 07/23/19 02/28/20  Mina Marble D, NP  etonogestrel-ethinyl estradiol (NUVARING) 0.12-0.015 MG/24HR vaginal ring Use for 3 weeks, then remove for 1 week. 12/28/16 02/28/20  Kem Boroughs, FNP    Family History Family History  Problem Relation Age of Onset  . Cancer Mother  basal cell  . Kidney disease Mother        kidney stones  . Alcohol abuse Father   . Heart attack Father   . Gout Father   . Alcohol abuse Brother   . Depression Brother   . Heart disease Maternal Grandfather   . Diabetes Maternal Grandfather   . Heart attack Maternal Grandfather   . Hypertension Maternal Grandfather   . Heart disease Paternal Grandfather   . Diabetes Paternal Grandfather   . Cancer Paternal Grandfather        lymphoma  . Hypothyroidism Maternal Grandmother   . Alzheimer's disease Paternal Grandmother   . Parkinson's disease Paternal Grandmother   . Scoliosis Paternal Grandmother     Social History Social History   Tobacco Use  . Smoking status: Never Smoker  . Smokeless tobacco: Never Used  Substance Use Topics  . Alcohol use: No    Alcohol/week: 0.0  standard drinks    Comment: Less than 1   . Drug use: No     Allergies   Diclofenac, Adhesive [tape], and Sulfa antibiotics   Review of Systems Review of Systems  Reason unable to perform ROS: See HPI as above.     Physical Exam Triage Vital Signs ED Triage Vitals  Enc Vitals Group     BP 02/28/20 0944 138/85     Pulse Rate 02/28/20 0944 100     Resp 02/28/20 0944 18     Temp 02/28/20 0944 98.2 F (36.8 C)     Temp Source 02/28/20 0944 Temporal     SpO2 02/28/20 0944 97 %     Weight --      Height --      Head Circumference --      Peak Flow --      Pain Score 02/28/20 0945 8     Pain Loc --      Pain Edu? --      Excl. in GC? --    No data found.  Updated Vital Signs BP 138/85 (BP Location: Left Arm)   Pulse 100   Temp 98.2 F (36.8 C) (Temporal)   Resp 18   LMP 12/31/2019   SpO2 97%   Physical Exam Constitutional:      General: She is not in acute distress.    Appearance: She is well-developed. She is not diaphoretic.  HENT:     Head: Normocephalic and atraumatic.  Eyes:     Conjunctiva/sclera: Conjunctivae normal.     Pupils: Pupils are equal, round, and reactive to light.  Cardiovascular:     Rate and Rhythm: Normal rate and regular rhythm.     Heart sounds: Normal heart sounds. No murmur. No friction rub. No gallop.   Pulmonary:     Effort: Pulmonary effort is normal. No accessory muscle usage or respiratory distress.     Breath sounds: Normal breath sounds. No stridor. No decreased breath sounds, wheezing, rhonchi or rales.  Musculoskeletal:     Comments: Tenderness to midline/left paraspinal area, along L4/L5 to sacral region. Decreased active ROM due to pain.  Full passive ROM of the hip. Negative straight leg raise.   Able to ambulate on own. No leg weakness  Skin:    General: Skin is warm and dry.  Neurological:     Mental Status: She is alert and oriented to person, place, and time.    UC Treatments / Results  Labs (all labs ordered  are listed, but only abnormal results are  displayed) Labs Reviewed  POCT URINE PREGNANCY - Normal    EKG   Radiology No results found.  Procedures Procedures (including critical care time)  Medications Ordered in UC Medications  methylPREDNISolone sodium succinate (SOLU-MEDROL) 125 mg/2 mL injection 80 mg (80 mg Intramuscular Given 02/28/20 1023)    Initial Impression / Assessment and Plan / UC Course  I have reviewed the triage vital signs and the nursing notes.  Pertinent labs & imaging results that were available during my care of the patient were reviewed by me and considered in my medical decision making (see chart for details).     Solumedrol injection in office today. Will continue mobic and provide muscle relaxant for symptomatic treatment. Given history of spondylolysis, will have patient follow up with neurosurgery if symptoms not improving. Discussed may need help from PCP for referral depending on insurance. Return precautions given. Patient expresses understanding and agrees to plan.  Final Clinical Impressions(s) / UC Diagnoses   Final diagnoses:  Acute left-sided low back pain without sciatica   ED Prescriptions    Medication Sig Dispense Auth. Provider   meloxicam (MOBIC) 15 MG tablet Take 1 tablet (15 mg total) by mouth daily as needed for pain. 30 tablet Prentiss Hammett V, PA-C   tiZANidine (ZANAFLEX) 4 MG tablet Take 1 tablet (4 mg total) by mouth every 6 (six) hours as needed for muscle spasms. 20 tablet Belinda Fisher, PA-C     PDMP not reviewed this encounter.   Belinda Fisher, PA-C 02/28/20 1042

## 2020-02-28 NOTE — Discharge Instructions (Signed)
Solu-Medrol injection in office today.  Continue Mobic as needed.  Can try tizanidine as needed, this can make you drowsy, so do not take if you are going to drive, operate heavy machinery, or make important decisions. Ice/heat compresses as needed.  Given your history, if this does not resolve in 1 week, please follow-up with orthopedics/neurosurgery for further evaluation and management needed.  If having any numbness to the inner thighs, leg weakness, loss of bladder or bowel control, go to the emergency department for further evaluation.

## 2020-02-28 NOTE — ED Notes (Signed)
Patient able to ambulate independently  

## 2020-02-28 NOTE — ED Triage Notes (Signed)
Pt presents to [facility] for assessment of lower back pain starting 2 days ago.  Patient states hx of spondolythisis and this is the first "flare" she has had in a while.

## 2020-03-12 ENCOUNTER — Encounter: Payer: Self-pay | Admitting: Family Medicine

## 2020-03-12 DIAGNOSIS — Z0000 Encounter for general adult medical examination without abnormal findings: Secondary | ICD-10-CM

## 2020-04-04 ENCOUNTER — Ambulatory Visit
Admission: EM | Admit: 2020-04-04 | Discharge: 2020-04-04 | Disposition: A | Discharge status: Home / Self Care | Payer: No Typology Code available for payment source | Attending: Internal Medicine | Admitting: Internal Medicine

## 2020-04-04 ENCOUNTER — Encounter: Payer: Self-pay | Admitting: Emergency Medicine

## 2020-04-04 ENCOUNTER — Ambulatory Visit (INDEPENDENT_AMBULATORY_CARE_PROVIDER_SITE_OTHER): Payer: No Typology Code available for payment source

## 2020-04-04 ENCOUNTER — Other Ambulatory Visit: Payer: Self-pay

## 2020-04-04 DIAGNOSIS — M79671 Pain in right foot: Secondary | ICD-10-CM

## 2020-04-04 DIAGNOSIS — Z3202 Encounter for pregnancy test, result negative: Secondary | ICD-10-CM

## 2020-04-04 LAB — POCT URINE PREGNANCY: Preg Test, Ur: NEGATIVE

## 2020-04-04 NOTE — Discharge Instructions (Addendum)
Recommend RICE: rest, ice, compression, elevation as needed for pain.    Heat therapy (hot compress, warm wash rag, hot showers, etc.) can help relax muscles and soothe muscle aches. Cold therapy (ice packs) can be used to help swelling both after injury and after prolonged use of areas of chronic pain/aches.  For pain: recommend 350 mg-1000 mg of Tylenol (acetaminophen) and/or 200 mg - 800 mg of Advil (ibuprofen, Motrin) every 8 hours as needed.  May alternate between the two throughout the day as they are generally safe to take together.  DO NOT exceed more than 3000 mg of Tylenol or 3200 mg of ibuprofen in a 24 hour period as this could damage your stomach, kidneys, liver, or increase your bleeding risk.  Return for worsening pain, swelling, bruising, inability to walk/bear weight.

## 2020-04-04 NOTE — ED Triage Notes (Signed)
Playing softball last night, stepped awkwardly and felt pop in foot.  Right foot pain, swelling visible.  Pain in foot, behind great toe and top of foot.  Can somewhat walk on outside of foot.  Pulses 2+, able to wiggle toes, but hurts worse.  Has used wrap, ice, and elevation

## 2020-04-04 NOTE — ED Provider Notes (Signed)
[facility]    CSN: [id number] Arrival date & time: 04/04/20  0808      History   Chief Complaint Chief Complaint  Patient presents with  . Foot Pain    HPI Madison Cooper is a 29 y.o. female presenting for right foot pain and swelling.  Patient states she was playing softball last night and stepped "awkwardly "and felt a pop in her foot.  Patient notes pain by her great toe, top of foot.  Has difficulty bearing weight.  Has used Ace wrap, ice, elevation with some relief.  Denies numbness.    Past Medical History:  Diagnosis Date  . Allergy   . Spondylisthesis     Patient Active Problem List   Diagnosis Date Noted  . Motion sickness 07/23/2019  . Anxiety and depression 06/24/2019  . Contact dermatitis 02/05/2019  . Acute hip pain, left 02/05/2019  . Healthcare maintenance 01/22/2019  . Spondylolisthesis 01/22/2019  . BMI 38.0-38.9,adult 01/22/2019    Past Surgical History:  Procedure Laterality Date  . ANKLE SURGERY Left 2010  . MANDIBLE SURGERY  11/15/12  . TONSILLECTOMY AND ADENOIDECTOMY  1/12  . TYMPANOSTOMY TUBE PLACEMENT    . WISDOM TOOTH EXTRACTION  12/11    OB History    Gravida  0   Para  0   Term  0   Preterm  0   AB  0   Living  0     SAB  0   TAB  0   Ectopic  0   Multiple  0   Live Births  0            Home Medications    Prior to Admission medications   Medication Sig Start Date End Date Taking? Authorizing Provider  acetaminophen (TYLENOL) 325 MG tablet Take 650 mg by mouth every 6 (six) hours as needed.   Yes [provider]  loratadine (CLARITIN) 10 MG tablet Take 10 mg by mouth daily.   Yes [provider]  Multiple Vitamins-Minerals (MULTIVITAMIN GUMMIES ADULT PO) Take 1 each by mouth daily.   Yes [provider]  tiZANidine (ZANAFLEX) 4 MG tablet Take 1 tablet (4 mg total) by mouth every 6 (six) hours as needed for muscle spasms. 02/28/20  Yes Yu, Amy V, PA-C  meloxicam  (MOBIC) 15 MG tablet Take 1 tablet (15 mg total) by mouth daily as needed for pain. 02/28/20   Cathie Hoops, Amy V, PA-C  scopolamine (TRANSDERM-SCOP, 1.5 MG,) 1 MG/3DAYS Place 1 patch (1.5 mg total) onto the skin every 3 (three) days. 07/23/19   Danford, Orpha Bur D, NP  escitalopram (LEXAPRO) 10 MG tablet Take 1 tablet (10 mg total) by mouth daily. 07/23/19 02/28/20  William Hamburger D, NP  etonogestrel-ethinyl estradiol (NUVARING) 0.12-0.015 MG/24HR vaginal ring Use for 3 weeks, then remove for 1 week. 12/28/16 02/28/20  Ria Comment, FNP    Family History Family History  Problem Relation Age of Onset  . Cancer Mother        basal cell  . Kidney disease Mother        kidney stones  . Alcohol abuse Father   . Heart attack Father   . Gout Father   . Alcohol abuse Brother   . Depression Brother   . Heart disease Maternal Grandfather   . Diabetes Maternal Grandfather   . Heart attack Maternal Grandfather   . Hypertension Maternal Grandfather   . Heart disease Paternal Grandfather   . Diabetes Paternal  Grandfather   . Cancer Paternal Grandfather        lymphoma  . Hypothyroidism Maternal Grandmother   . Alzheimer's disease Paternal Grandmother   . Parkinson's disease Paternal Grandmother   . Scoliosis Paternal Grandmother     Social History Social History   Tobacco Use  . Smoking status: Never Smoker  . Smokeless tobacco: Never Used  Substance Use Topics  . Alcohol use: No    Alcohol/week: 0.0 standard drinks    Comment: Less than 1   . Drug use: No     Allergies   Diclofenac, Adhesive [tape], and Sulfa antibiotics   Review of Systems As per HPI   Physical Exam Triage Vital Signs ED Triage Vitals  Enc Vitals Group     BP      Pulse      Resp      Temp      Temp src      SpO2      Weight      Height      Head Circumference      Peak Flow      Pain Score      Pain Loc      Pain Edu?      Excl. in [place]?    No data found.  Updated Vital Signs BP 125/81 (BP Location: Left  Arm) Comment (BP Location): large cuff  Pulse 98   Temp 99.2 F (37.3 C) (Oral)   Resp (!) 22 Comment: crying  LMP 03/01/2020   SpO2 99%   Visual Acuity Right Eye Distance:   Left Eye Distance:   Bilateral Distance:    Right Eye Near:   Left Eye Near:    Bilateral Near:     Physical Exam Constitutional:      General: She is not in acute distress. HENT:     Head: Normocephalic and atraumatic.  Eyes:     General: No scleral icterus.    Pupils: Pupils are equal, round, and reactive to light.  Cardiovascular:     Rate and Rhythm: Normal rate.  Pulmonary:     Effort: Pulmonary effort is normal.  Musculoskeletal:     Right ankle: Normal.     Right Achilles Tendon: Normal.     Left ankle: Normal.     Left Achilles Tendon: Normal.     Right foot: Normal.     Left foot: Normal.     Comments: Trace swelling over dorsal aspect of right foot as compared to left.  Mild TTP without crepitus.  Pain with plantarflexion against resistance.  Skin:    Coloration: Skin is not jaundiced or pale.  Neurological:     Mental Status: She is alert and oriented to person, place, and time.      UC Treatments / Results  Labs (all labs ordered are listed, but only abnormal results are displayed) Labs Reviewed  POCT URINE PREGNANCY - Normal    EKG   Radiology DG Foot Complete Right  Result Date: 04/04/2020 CLINICAL DATA:  Right foot injury playing softball yesterday. Pain plantar medial aspect first toe. EXAM: RIGHT FOOT COMPLETE - 3+ VIEW COMPARISON:  None. FINDINGS: There is no evidence of fracture or dislocation. There is no evidence of arthropathy or other focal bone abnormality. Soft tissues are unremarkable. IMPRESSION: No acute findings. Electronically Signed   By: Marin Olp M.D.   On: 04/04/2020 09:08    Procedures Procedures (including critical care time)  Medications Ordered in UC  Medications - No data to display  Initial Impression / Assessment and Plan / UC Course    I have reviewed the triage vital signs and the nursing notes.  Pertinent labs & imaging results that were available during my care of the patient were reviewed by me and considered in my medical decision making (see chart for details).     Patient without ankle inversion injury, high impact injury.  Patient requesting radiography for reassurance.  Urine pregnancy obtained prior to imaging as patient has history of irregular cycles, does not use contraception: Negative.  This is done office, reviewed by me and radiology: No evidence of fracture or dislocation, soft tissues unremarkable.  Reviewed findings with patient verbalized understanding.  Discussed RICE as outlined below: Patient requesting ASO brace.  This was applied in an office, patient tolerated well.  Work note provided.  Return precautions discussed, patient verbalized understanding and is agreeable to plan. Final Clinical Impressions(s) / UC Diagnoses   Final diagnoses:  Right foot pain     Discharge Instructions     Recommend RICE: rest, ice, compression, elevation as needed for pain.    Heat therapy (hot compress, warm wash rag, hot showers, etc.) can help relax muscles and soothe muscle aches. Cold therapy (ice packs) can be used to help swelling both after injury and after prolonged use of areas of chronic pain/aches.  For pain: recommend 350 mg-1000 mg of Tylenol (acetaminophen) and/or 200 mg - 800 mg of Advil (ibuprofen, Motrin) every 8 hours as needed.  May alternate between the two throughout the day as they are generally safe to take together.  DO NOT exceed more than 3000 mg of Tylenol or 3200 mg of ibuprofen in a 24 hour period as this could damage your stomach, kidneys, liver, or increase your bleeding risk.  Return for worsening pain, swelling, bruising, inability to walk/bear weight.    ED Prescriptions    None     PDMP not reviewed this encounter.   Hall-Potvin, [place], [place] 04/04/20 [phone]

## 2020-04-07 MED FILL — MEDROXYPROGESTERONE 10 MG T: 10 | 7 days supply | Qty: 7 | Fill #0

## 2020-04-15 ENCOUNTER — Other Ambulatory Visit: Payer: Self-pay

## 2020-04-15 ENCOUNTER — Ambulatory Visit (INDEPENDENT_AMBULATORY_CARE_PROVIDER_SITE_OTHER): Payer: No Typology Code available for payment source | Admitting: Family Medicine

## 2020-04-15 ENCOUNTER — Encounter: Payer: Self-pay | Admitting: Family Medicine

## 2020-04-15 VITALS — BP 115/76 | HR 86 | Temp 98.6°F | Ht 71.0 in | Wt 279.2 lb

## 2020-04-15 DIAGNOSIS — R5383 Other fatigue: Secondary | ICD-10-CM | POA: Insufficient documentation

## 2020-04-15 DIAGNOSIS — E282 Polycystic ovarian syndrome: Secondary | ICD-10-CM

## 2020-04-15 DIAGNOSIS — Z719 Counseling, unspecified: Secondary | ICD-10-CM

## 2020-04-15 DIAGNOSIS — G479 Sleep disorder, unspecified: Secondary | ICD-10-CM | POA: Insufficient documentation

## 2020-04-15 DIAGNOSIS — Z6838 Body mass index (BMI) 38.0-38.9, adult: Secondary | ICD-10-CM

## 2020-04-15 DIAGNOSIS — Z0000 Encounter for general adult medical examination without abnormal findings: Secondary | ICD-10-CM

## 2020-04-15 DIAGNOSIS — F4329 Adjustment disorder with other symptoms: Secondary | ICD-10-CM | POA: Insufficient documentation

## 2020-04-15 NOTE — Patient Instructions (Addendum)
Managing Stress, Adult Feeling a certain amount of stress is normal. Stress helps our body and mind get ready to deal with the demands of life. Stress hormones can motivate you to do well at work and meet your responsibilities. However severe or long-lasting (chronic) stress can affect your mental and physical health. Chronic stress puts you at higher risk for anxiety, depression, and other health problems like digestive problems, muscle aches, heart disease, high blood pressure, and stroke. What are the causes? Common causes of stress include:  Demands from work, such as deadlines, feeling overworked, or having long hours.  Pressures at home, such as money issues, disagreements with a spouse, or parenting issues.  Pressures from major life changes, such as divorce, moving, loss of a loved one, or chronic illness. You may be at higher risk for stress-related problems if you do not get enough sleep, are in poor health, do not have emotional support, or have a mental health disorder like anxiety or depression. How to recognize stress Stress can make you:  Have trouble sleeping.  Feel sad, anxious, irritable, or overwhelmed.  Lose your appetite.  Overeat or want to eat unhealthy foods.  Want to use drugs or alcohol. Stress can also cause physical symptoms, such as:  Sore, tense muscles, especially in the shoulders and neck.  Headaches.  Trouble breathing.  A faster heart rate.  Stomach pain, nausea, or vomiting.  Diarrhea or constipation.  Trouble concentrating. Follow these instructions at home: Lifestyle  Identify the source of your stress and your reaction to it. See a therapist who can help you change your reactions.  When there are stressful events: ? Talk about it with family, friends, or co-workers. ? Try to think realistically about stressful events and not ignore them or overreact. ? Try to find the positives in a stressful situation and not focus on the  negatives. ? Cut back on responsibilities at work and home, if possible. Ask for help from friends or family members if you need it.  Find ways to cope with stress, such as: ? Meditation. ? Deep breathing. ? Yoga or tai chi. ? Progressive muscle relaxation. ? Doing art, playing music, or reading. ? Making time for fun activities. ? Spending time with family and friends.  Get support from family, friends, or spiritual resources. Eating and drinking  Eat a healthy diet. This includes: ? Eating foods that are high in fiber, such as beans, whole grains, and fresh fruits and vegetables. ? Limiting foods that are high in fat and processed sugars, such as fried and sweet foods.  Do not skip meals or overeat.  Drink enough fluid to keep your urine pale yellow. Alcohol use  Do not drink alcohol if: ? Your health care provider tells you not to drink. ? You are pregnant, may be pregnant, or are planning to become pregnant.  Drinking alcohol is a way some people try to ease their stress. This can be dangerous, so if you drink alcohol: ? Limit how much you use to:  0-1 drink a day for women.  0-2 drinks a day for men. ? Be aware of how much alcohol is in your drink. In the [place] one drink equals one 12 oz bottle of beer (355 mL), one 5 oz glass of wine (148 mL), or one 1 oz glass of hard liquor (44 mL). Activity   Include 30 minutes of exercise in your daily schedule. Exercise is a good stress reducer.  Include time in your  day for an activity that you find relaxing. Try taking a walk, going on a bike ride, reading a book, or listening to music.  Schedule your time in a way that lowers stress, and keep a consistent schedule. Prioritize what is most important to get done. General instructions  Get enough sleep. Try to go to sleep and get up at about the same time every day.  Take over-the-counter and prescription medicines only as told by your health care provider.  Do not use any  products that contain nicotine or tobacco, such as cigarettes, e-cigarettes, and chewing tobacco. If you need help quitting, ask your health care provider.  Do not use drugs or smoke to cope with stress.  Keep all follow-up visits as told by your health care provider. This is important. Where to find support  Talk with your health care provider about stress management or finding a support group.  Find a therapist to work with you on your stress management techniques. Contact a health care provider if:  Your stress symptoms get worse.  You are unable to manage your stress at home.  You are struggling to stop using drugs or alcohol. Get help right away if:  You may be a danger to yourself or others.  You have any thoughts of death or suicide. If you ever feel like you may hurt yourself or others, or have thoughts about taking your own life, get help right away. You can go to your nearest emergency department or call:  Your local emergency services (911 in the [place]  A suicide crisis helpline, such as the [facility] at 517-193-6464. This is open 24 hours a day. Summary  Feeling a certain amount of stress is normal, but severe or long-lasting (chronic) stress can affect your mental and physical health.  Chronic stress can put you at higher risk for anxiety, depression, and other health problems like digestive problems, muscle aches, heart disease, high blood pressure, and stroke.  You may be at higher risk for stress-related problems if you do not get enough sleep, are in poor health, lack emotional support, or have a mental health disorder like anxiety or depression.  Identify the source of your stress and your reaction to it. Try talking about stressful events with family, friends, or co-workers, finding a coping method, or getting support from spiritual resources.  If you need more help, talk with your health care provider about finding a support group  or a mental health therapist. This information is not intended to replace advice given to you by your health care provider. Make sure you discuss any questions you have with your health care provider. Document Revised: 07/10/2019 Document Reviewed: 07/10/2019 Elsevier Patient Education  [employer] [place] for Adults, Female  A healthy lifestyle and preventive care can promote health and wellness. Preventive health guidelines for women include the following key practices.   A routine yearly physical is a good way to check with your health care provider about your health and preventive screening. It is a chance to share any concerns and updates on your health and to receive a thorough exam.   Visit your dentist for a routine exam and preventive care every 6 months. Brush your teeth twice a day and floss once a day. Good oral hygiene prevents tooth decay and gum disease.   The frequency of eye exams is based on your age, health, family medical history, use of contact lenses, and  other factors. Follow your health care provider's recommendations for frequency of eye exams.   Eat a healthy diet. Foods like vegetables, fruits, whole grains, low-fat dairy products, and lean protein foods contain the nutrients you need without too many calories. Decrease your intake of foods high in solid fats, added sugars, and salt. Eat the right amount of calories for you.Get information about a proper diet from your health care provider, if necessary.   Regular physical exercise is one of the most important things you can do for your health. Most adults should get at least 150 minutes of moderate-intensity exercise (any activity that increases your heart rate and causes you to sweat) each week. In addition, most adults need muscle-strengthening exercises on 2 or more days a week.   Maintain a healthy weight. The body mass index (BMI) is a screening tool to identify possible weight  problems. It provides an estimate of body fat based on height and weight. Your health care provider can find your BMI, and can help you achieve or maintain a healthy weight.For adults 20 years and older:   - A BMI below 18.5 is considered underweight.   - A BMI of 18.5 to 24.9 is normal.   - A BMI of 25 to 29.9 is considered overweight.   - A BMI of 30 and above is considered obese.   Maintain normal blood lipids and cholesterol levels by exercising and minimizing your intake of trans and saturated fats.  Eat a balanced diet with plenty of fruit and vegetables. Blood tests for lipids and cholesterol should begin at age 50 and be repeated every 5 years minimum.  If your lipid or cholesterol levels are high, you are over 40, or you are at high risk for heart disease, you may need your cholesterol levels checked more frequently.Ongoing high lipid and cholesterol levels should be treated with medicines if diet and exercise are not working.   If you smoke, find out from your health care provider how to quit. If you do not use tobacco, do not start.   Lung cancer screening is recommended for adults aged 89-80 years who are at high risk for developing lung cancer because of a history of smoking. A yearly low-dose CT scan of the lungs is recommended for people who have at least a 30-pack-year history of smoking and are a current smoker or have quit within the past 15 years. A pack year of smoking is smoking an average of 1 pack of cigarettes a day for 1 year (for example: 1 pack a day for 30 years or 2 packs a day for 15 years). Yearly screening should continue until the smoker has stopped smoking for at least 15 years. Yearly screening should be stopped for people who develop a health problem that would prevent them from having lung cancer treatment.   If you are pregnant, do not drink alcohol. If you are breastfeeding, be very cautious about drinking alcohol. If you are not pregnant and choose to drink  alcohol, do not have more than 1 drink per day. One drink is considered to be 12 ounces (355 mL) of beer, 5 ounces (148 mL) of wine, or 1.5 ounces (44 mL) of liquor.   Avoid use of street drugs. Do not share needles with anyone. Ask for help if you need support or instructions about stopping the use of drugs.   High blood pressure causes heart disease and increases the risk of stroke. Your blood pressure should be checked at  least yearly.  Ongoing high blood pressure should be treated with medicines if weight loss and exercise do not work.   If you are 53-13 years old, ask your health care provider if you should take aspirin to prevent strokes.   Diabetes screening involves taking a blood sample to check your fasting blood sugar level. This should be done once every 3 years, after age 13, if you are within normal weight and without risk factors for diabetes. Testing should be considered at a younger age or be carried out more frequently if you are overweight and have at least 1 risk factor for diabetes.   Breast cancer screening is essential preventive care for women. You should practice "breast self-awareness."  This means understanding the normal appearance and feel of your breasts and may include breast self-examination.  Any changes detected, no matter how small, should be reported to a health care provider.  Women in their 52s and 30s should have a clinical breast exam (CBE) by a health care provider as part of a regular health exam every 1 to 3 years.  After age 39, women should have a CBE every year.  Starting at age 64, women should consider having a mammogram (breast X-ray test) every year.  Women who have a family history of breast cancer should talk to their health care provider about genetic screening.  Women at a high risk of breast cancer should talk to their health care providers about having an MRI and a mammogram every year.   -Breast cancer gene (BRCA)-related cancer risk  assessment is recommended for women who have family members with BRCA-related cancers. BRCA-related cancers include breast, ovarian, tubal, and peritoneal cancers. Having family members with these cancers may be associated with an increased risk for harmful changes (mutations) in the breast cancer genes BRCA1 and BRCA2. Results of the assessment will determine the need for genetic counseling and BRCA1 and BRCA2 testing.   The Pap test is a screening test for cervical cancer. A Pap test can show cell changes on the cervix that might become cervical cancer if left untreated. A Pap test is a procedure in which cells are obtained and examined from the lower end of the uterus (cervix).   - Women should have a Pap test starting at age 51.   - Between ages 54 and 72, Pap tests should be repeated every 2 years.   - Beginning at age 62, you should have a Pap test every 3 years as long as the past 3 Pap tests have been normal.   - Some women have medical problems that increase the chance of getting cervical cancer. Talk to your health care provider about these problems. It is especially important to talk to your health care provider if a new problem develops soon after your last Pap test. In these cases, your health care provider may recommend more frequent screening and Pap tests.   - The above recommendations are the same for women who have or have not gotten the vaccine for human papillomavirus (HPV).   - If you had a hysterectomy for a problem that was not cancer or a condition that could lead to cancer, then you no longer need Pap tests. Even if you no longer need a Pap test, a regular exam is a good idea to make sure no other problems are starting.   - If you are between ages 73 and 62 years, and you have had normal Pap tests going back 10 years, you  no longer need Pap tests. Even if you no longer need a Pap test, a regular exam is a good idea to make sure no other problems are starting.   - If you  have had past treatment for cervical cancer or a condition that could lead to cancer, you need Pap tests and screening for cancer for at least 20 years after your treatment.   - If Pap tests have been discontinued, risk factors (such as a new sexual partner) need to be reassessed to determine if screening should be resumed.   - The HPV test is an additional test that may be used for cervical cancer screening. The HPV test looks for the virus that can cause the cell changes on the cervix. The cells collected during the Pap test can be tested for HPV. The HPV test could be used to screen women aged 54 years and older, and should be used in women of any age who have unclear Pap test results. After the age of 6, women should have HPV testing at the same frequency as a Pap test.   Colorectal cancer can be detected and often prevented. Most routine colorectal cancer screening begins at the age of 40 years and continues through age 46 years. However, your health care provider may recommend screening at an earlier age if you have risk factors for colon cancer. On a yearly basis, your health care provider may provide home test kits to check for hidden blood in the stool.  Use of a small camera at the end of a tube, to directly examine the colon (sigmoidoscopy or colonoscopy), can detect the earliest forms of colorectal cancer. Talk to your health care provider about this at age 48, when routine screening begins. Direct exam of the colon should be repeated every 5 -10 years through age 38 years, unless early forms of pre-cancerous polyps or small growths are found.   People who are at an increased risk for hepatitis B should be screened for this virus. You are considered at high risk for hepatitis B if:  -You were born in a country where hepatitis B occurs often. Talk with your health care provider about which countries are considered high risk.  - Your parents were born in a high-risk country and you have not  received a shot to protect against hepatitis B (hepatitis B vaccine).  - You have HIV or AIDS.  - You use needles to inject street drugs.  - You live with, or have sex with, someone who has Hepatitis B.  - You get hemodialysis treatment.  - You take certain medicines for conditions like cancer, organ transplantation, and autoimmune conditions.   Hepatitis C blood testing is recommended for all people born from [birth year] through 1963 and any individual with known risks for hepatitis C.   Practice safe sex. Use condoms and avoid high-risk sexual practices to reduce the spread of sexually transmitted infections (STIs). STIs include gonorrhea, chlamydia, syphilis, trichomonas, herpes, HPV, and human immunodeficiency virus (HIV). Herpes, HIV, and HPV are viral illnesses that have no cure. They can result in disability, cancer, and death. Sexually active women aged 95 years and younger should be checked for chlamydia. Older women with new or multiple partners should also be tested for chlamydia. Testing for other STIs is recommended if you are sexually active and at increased risk.   Osteoporosis is a disease in which the bones lose minerals and strength with aging. This can result in serious bone fractures or breaks.  The risk of osteoporosis can be identified using a bone density scan. Women ages 53 years and over and women at risk for fractures or osteoporosis should discuss screening with their health care providers. Ask your health care provider whether you should take a calcium supplement or vitamin D to There are also several preventive steps women can take to avoid osteoporosis and resulting fractures or to keep osteoporosis from worsening. -->Recommendations include:  Eat a balanced diet high in fruits, vegetables, calcium, and vitamins.  Get enough calcium. The recommended total intake of is 1,200 mg daily; for best absorption, if taking supplements, divide doses into 250-500 mg doses throughout the  day. Of the two types of calcium, calcium carbonate is best absorbed when taken with food but calcium citrate can be taken on an empty stomach.  Get enough vitamin D. [employer] and the [place] recommend at least 1,000 IU per day for women age 4 and over who are at risk of vitamin D deficiency. Vitamin D deficiency can be caused by inadequate sun exposure (for example, those who live in [place]).  Avoid alcohol and smoking. Heavy alcohol intake (more than 7 drinks per week) increases the risk of falls and hip fracture and women smokers tend to lose bone more rapidly and have lower bone mass than nonsmokers. Stopping smoking is one of the most important changes women can make to improve their health and decrease risk for disease.  Be physically active every day. Weight-bearing exercise (for example, fast walking, hiking, jogging, and weight training) may strengthen bones or slow the rate of bone loss that comes with aging. Balancing and muscle-strengthening exercises can reduce the risk of falling and fracture.  Consider therapeutic medications. Currently, several types of effective drugs are available. Healthcare providers can recommend the type most appropriate for each woman.  Eliminate environmental factors that may contribute to accidents. Falls cause nearly 90% of all osteoporotic fractures, so reducing this risk is an important bone-health strategy. Measures include ample lighting, removing obstructions to walking, using nonskid rugs on floors, and placing mats and/or grab bars in showers.  Be aware of medication side effects. Some common medicines make bones weaker. These include a type of steroid drug called glucocorticoids used for arthritis and asthma, some antiseizure drugs, certain sleeping pills, treatments for endometriosis, and some cancer drugs. An overactive thyroid gland or using too much thyroid hormone for an underactive thyroid can also be a problem. If  you are taking these medicines, talk to your doctor about what you can do to help protect your bones.reduce the rate of osteoporosis.    Menopause can be associated with physical symptoms and risks. Hormone replacement therapy is available to decrease symptoms and risks. You should talk to your health care provider about whether hormone replacement therapy is right for you.   Use sunscreen. Apply sunscreen liberally and repeatedly throughout the day. You should seek shade when your shadow is shorter than you. Protect yourself by wearing long sleeves, pants, a wide-brimmed hat, and sunglasses year round, whenever you are outdoors.   Once a month, do a whole body skin exam, using a mirror to look at the skin on your back. Tell your health care provider of new moles, moles that have irregular borders, moles that are larger than a pencil eraser, or moles that have changed in shape or color.   -Stay current with required vaccines (immunizations).   Influenza vaccine. All adults should be immunized every year.  Tetanus, diphtheria, and  acellular pertussis (Td, Tdap) vaccine. Pregnant women should receive 1 dose of Tdap vaccine during each pregnancy. The dose should be obtained regardless of the length of time since the last dose. Immunization is preferred during the 27th 36th week of gestation. An adult who has not previously received Tdap or who does not know her vaccine status should receive 1 dose of Tdap. This initial dose should be followed by tetanus and diphtheria toxoids (Td) booster doses every 10 years. Adults with an unknown or incomplete history of completing a 3-dose immunization series with Td-containing vaccines should begin or complete a primary immunization series including a Tdap dose. Adults should receive a Td booster every 10 years.  Varicella vaccine. An adult without evidence of immunity to varicella should receive 2 doses or a second dose if she has previously received 1 dose.  Pregnant females who do not have evidence of immunity should receive the first dose after pregnancy. This first dose should be obtained before leaving the health care facility. The second dose should be obtained 4 8 weeks after the first dose.  Human papillomavirus (HPV) vaccine. Females aged 52 26 years who have not received the vaccine previously should obtain the 3-dose series. The vaccine is not recommended for use in pregnant females. However, pregnancy testing is not needed before receiving a dose. If a female is found to be pregnant after receiving a dose, no treatment is needed. In that case, the remaining doses should be delayed until after the pregnancy. Immunization is recommended for any person with an immunocompromised condition through the age of 39 years if she did not get any or all doses earlier. During the 3-dose series, the second dose should be obtained 4 8 weeks after the first dose. The third dose should be obtained 24 weeks after the first dose and 16 weeks after the second dose.  Zoster vaccine. One dose is recommended for adults aged 33 years or older unless certain conditions are present.  Measles, mumps, and rubella (MMR) vaccine. Adults born before [birth year] generally are considered immune to measles and mumps. Adults born in [birth year] or later should have 1 or more doses of MMR vaccine unless there is a contraindication to the vaccine or there is laboratory evidence of immunity to each of the three diseases. A routine second dose of MMR vaccine should be obtained at least 28 days after the first dose for students attending postsecondary schools, health care workers, or international travelers. People who received inactivated measles vaccine or an unknown type of measles vaccine during 1963 1967 should receive 2 doses of MMR vaccine. People who received inactivated mumps vaccine or an unknown type of mumps vaccine before 1979 and are at high risk for mumps infection should consider  immunization with 2 doses of MMR vaccine. For females of childbearing age, rubella immunity should be determined. If there is no evidence of immunity, females who are not pregnant should be vaccinated. If there is no evidence of immunity, females who are pregnant should delay immunization until after pregnancy. Unvaccinated health care workers born before [birth year] who lack laboratory evidence of measles, mumps, or rubella immunity or laboratory confirmation of disease should consider measles and mumps immunization with 2 doses of MMR vaccine or rubella immunization with 1 dose of MMR vaccine.  Pneumococcal 13-valent conjugate (PCV13) vaccine. When indicated, a person who is uncertain of her immunization history and has no record of immunization should receive the PCV13 vaccine. An adult aged 40 years or older who has  certain medical conditions and has not been previously immunized should receive 1 dose of PCV13 vaccine. This PCV13 should be followed with a dose of pneumococcal polysaccharide (PPSV23) vaccine. The PPSV23 vaccine dose should be obtained at least 8 weeks after the dose of PCV13 vaccine. An adult aged 39 years or older who has certain medical conditions and previously received 1 or more doses of PPSV23 vaccine should receive 1 dose of PCV13. The PCV13 vaccine dose should be obtained 1 or more years after the last PPSV23 vaccine dose.  Pneumococcal polysaccharide (PPSV23) vaccine. When PCV13 is also indicated, PCV13 should be obtained first. All adults aged 50 years and older should be immunized. An adult younger than age 8 years who has certain medical conditions should be immunized. Any person who resides in a nursing home or long-term care facility should be immunized. An adult smoker should be immunized. People with an immunocompromised condition and certain other conditions should receive both PCV13 and PPSV23 vaccines. People with human immunodeficiency virus (HIV) infection should be immunized as  soon as possible after diagnosis. Immunization during chemotherapy or radiation therapy should be avoided. Routine use of PPSV23 vaccine is not recommended for American Indians, [place] Natives, or people younger than 65 years unless there are medical conditions that require PPSV23 vaccine. When indicated, people who have unknown immunization and have no record of immunization should receive PPSV23 vaccine. One-time revaccination 5 years after the first dose of PPSV23 is recommended for people aged 68 64 years who have chronic kidney failure, nephrotic syndrome, asplenia, or immunocompromised conditions. People who received 1 2 doses of PPSV23 before age 75 years should receive another dose of PPSV23 vaccine at age 64 years or later if at least 5 years have passed since the previous dose. Doses of PPSV23 are not needed for people immunized with PPSV23 at or after age 32 years.  Meningococcal vaccine. Adults with asplenia or persistent complement component deficiencies should receive 2 doses of quadrivalent meningococcal conjugate (MenACWY-D) vaccine. The doses should be obtained at least 2 months apart. Microbiologists working with certain meningococcal bacteria, [employer] recruits, people at risk during an outbreak, and people who travel to or live in countries with a high rate of meningitis should be immunized. A first-year college student up through age 65 years who is living in a residence hall should receive a dose if she did not receive a dose on or after her 16th birthday. Adults who have certain high-risk conditions should receive one or more doses of vaccine.  Hepatitis A vaccine. Adults who wish to be protected from this disease, have certain high-risk conditions, work with hepatitis A-infected animals, work in hepatitis A research labs, or travel to or work in countries with a high rate of hepatitis A should be immunized. Adults who were previously unvaccinated and who anticipate close contact with an  international adoptee during the first 60 days after arrival in the [place] from a country with a high rate of hepatitis A should be immunized.  Hepatitis B vaccine.  Adults who wish to be protected from this disease, have certain high-risk conditions, may be exposed to blood or other infectious body fluids, are household contacts or sex partners of hepatitis B positive people, are clients or workers in certain care facilities, or travel to or work in countries with a high rate of hepatitis B should be immunized.  Haemophilus influenzae type b (Hib) vaccine. A previously unvaccinated person with asplenia or sickle cell disease or having a scheduled  splenectomy should receive 1 dose of Hib vaccine. Regardless of previous immunization, a recipient of a hematopoietic stem cell transplant should receive a 3-dose series 6 12 months after her successful transplant. Hib vaccine is not recommended for adults with HIV infection.  Preventive Services / Frequency Ages 59 to 39years  Blood pressure check.** / Every 1 to 2 years.  Lipid and cholesterol check.** / Every 5 years beginning at age 55.  Clinical breast exam.** / Every 3 years for women in their 38s and 5s.  BRCA-related cancer risk assessment.** / For women who have family members with a BRCA-related cancer (breast, ovarian, tubal, or peritoneal cancers).  Pap test.** / Every 2 years from ages 36 through 68. Every 3 years starting at age 3 through age 6 or 72 with a history of 3 consecutive normal Pap tests.  HPV screening.** / Every 3 years from ages 9 through ages 50 to 63 with a history of 3 consecutive normal Pap tests.  Hepatitis C blood test.** / For any individual with known risks for hepatitis C.  Skin self-exam. / Monthly.  Influenza vaccine. / Every year.  Tetanus, diphtheria, and acellular pertussis (Tdap, Td) vaccine.** / Consult your health care provider. Pregnant women should receive 1 dose of Tdap vaccine during  each pregnancy. 1 dose of Td every 10 years.  Varicella vaccine.** / Consult your health care provider. Pregnant females who do not have evidence of immunity should receive the first dose after pregnancy.  HPV vaccine. / 3 doses over 6 months, if 63 and younger. The vaccine is not recommended for use in pregnant females. However, pregnancy testing is not needed before receiving a dose.  Measles, mumps, rubella (MMR) vaccine.** / You need at least 1 dose of MMR if you were born in 1957 or later. You may also need a 2nd dose. For females of childbearing age, rubella immunity should be determined. If there is no evidence of immunity, females who are not pregnant should be vaccinated. If there is no evidence of immunity, females who are pregnant should delay immunization until after pregnancy.  Pneumococcal 13-valent conjugate (PCV13) vaccine.** / Consult your health care provider.  Pneumococcal polysaccharide (PPSV23) vaccine.** / 1 to 2 doses if you smoke cigarettes or if you have certain conditions.  Meningococcal vaccine.** / 1 dose if you are age 20 to 20 years and a [employer] researcher living in a residence hall, or have one of several medical conditions, you need to get vaccinated against meningococcal disease. You may also need additional booster doses.  Hepatitis A vaccine.** / Consult your health care provider.  Hepatitis B vaccine.** / Consult your health care provider.  Haemophilus influenzae type b (Hib) vaccine.** / Consult your health care provider.  Ages 44 to 64years  Blood pressure check.** / Every 1 to 2 years.  Lipid and cholesterol check.** / Every 5 years beginning at age 60 years.  Lung cancer screening. / Every year if you are aged 20 80 years and have a 30-pack-year history of smoking and currently smoke or have quit within the past 15 years. Yearly screening is stopped once you have quit smoking for at least 15 years or develop a health problem that would  prevent you from having lung cancer treatment.  Clinical breast exam.** / Every year after age 63 years.  BRCA-related cancer risk assessment.** / For women who have family members with a BRCA-related cancer (breast, ovarian, tubal, or peritoneal cancers).  Mammogram.** / Every year beginning at  age 80 years and continuing for as long as you are in good health. Consult with your health care provider.  Pap test.** / Every 3 years starting at age 79 years through age 45 or 71 years with a history of 3 consecutive normal Pap tests.  HPV screening.** / Every 3 years from ages 80 years through ages 67 to 21 years with a history of 3 consecutive normal Pap tests.  Fecal occult blood test (FOBT) of stool. / Every year beginning at age 25 years and continuing until age 16 years. You may not need to do this test if you get a colonoscopy every 10 years.  Flexible sigmoidoscopy or colonoscopy.** / Every 5 years for a flexible sigmoidoscopy or every 10 years for a colonoscopy beginning at age 56 years and continuing until age 46 years.  Hepatitis C blood test.** / For all people born from [birth year] through 1963 and any individual with known risks for hepatitis C.  Skin self-exam. / Monthly.  Influenza vaccine. / Every year.  Tetanus, diphtheria, and acellular pertussis (Tdap/Td) vaccine.** / Consult your health care provider. Pregnant women should receive 1 dose of Tdap vaccine during each pregnancy. 1 dose of Td every 10 years.  Varicella vaccine.** / Consult your health care provider. Pregnant females who do not have evidence of immunity should receive the first dose after pregnancy.  Zoster vaccine.** / 1 dose for adults aged 57 years or older.  Measles, mumps, rubella (MMR) vaccine.** / You need at least 1 dose of MMR if you were born in 1957 or later. You may also need a 2nd dose. For females of childbearing age, rubella immunity should be determined. If there is no evidence of immunity, females who  are not pregnant should be vaccinated. If there is no evidence of immunity, females who are pregnant should delay immunization until after pregnancy.  Pneumococcal 13-valent conjugate (PCV13) vaccine.** / Consult your health care provider.  Pneumococcal polysaccharide (PPSV23) vaccine.** / 1 to 2 doses if you smoke cigarettes or if you have certain conditions.  Meningococcal vaccine.** / Consult your health care provider.  Hepatitis A vaccine.** / Consult your health care provider.  Hepatitis B vaccine.** / Consult your health care provider.  Haemophilus influenzae type b (Hib) vaccine.** / Consult your health care provider.  Ages 75 years and over  Blood pressure check.** / Every 1 to 2 years.  Lipid and cholesterol check.** / Every 5 years beginning at age 84 years.  Lung cancer screening. / Every year if you are aged 59 80 years and have a 30-pack-year history of smoking and currently smoke or have quit within the past 15 years. Yearly screening is stopped once you have quit smoking for at least 15 years or develop a health problem that would prevent you from having lung cancer treatment.  Clinical breast exam.** / Every year after age 48 years.  BRCA-related cancer risk assessment.** / For women who have family members with a BRCA-related cancer (breast, ovarian, tubal, or peritoneal cancers).  Mammogram.** / Every year beginning at age 44 years and continuing for as long as you are in good health. Consult with your health care provider.  Pap test.** / Every 3 years starting at age 54 years through age 73 or 33 years with 3 consecutive normal Pap tests. Testing can be stopped between 65 and 70 years with 3 consecutive normal Pap tests and no abnormal Pap or HPV tests in the past 10 years.  HPV screening.** / Every  3 years from ages 34 years through ages 10 or 50 years with a history of 3 consecutive normal Pap tests. Testing can be stopped between 65 and 70 years with 3 consecutive  normal Pap tests and no abnormal Pap or HPV tests in the past 10 years.  Fecal occult blood test (FOBT) of stool. / Every year beginning at age 35 years and continuing until age 66 years. You may not need to do this test if you get a colonoscopy every 10 years.  Flexible sigmoidoscopy or colonoscopy.** / Every 5 years for a flexible sigmoidoscopy or every 10 years for a colonoscopy beginning at age 37 years and continuing until age 23 years.  Hepatitis C blood test.** / For all people born from [birth year] through 1963 and any individual with known risks for hepatitis C.  Osteoporosis screening.** / A one-time screening for women ages 17 years and over and women at risk for fractures or osteoporosis.  Skin self-exam. / Monthly.  Influenza vaccine. / Every year.  Tetanus, diphtheria, and acellular pertussis (Tdap/Td) vaccine.** / 1 dose of Td every 10 years.  Varicella vaccine.** / Consult your health care provider.  Zoster vaccine.** / 1 dose for adults aged 14 years or older.  Pneumococcal 13-valent conjugate (PCV13) vaccine.** / Consult your health care provider.  Pneumococcal polysaccharide (PPSV23) vaccine.** / 1 dose for all adults aged 55 years and older.  Meningococcal vaccine.** / Consult your health care provider.  Hepatitis A vaccine.** / Consult your health care provider.  Hepatitis B vaccine.** / Consult your health care provider.  Haemophilus influenzae type b (Hib) vaccine.** / Consult your health care provider. ** Family history and personal history of risk and conditions may change your health care provider's recommendations. Document Released: 02/07/2002 Document Revised: 10/02/2013  [facility] Patient Information 2014 [place], [place].   EXERCISE AND DIET:  We recommended that you start or continue a regular exercise program for good health. Regular exercise means any activity that makes your heart beat faster and makes you sweat.  We recommend exercising at least 30  minutes per day at least 3 days a week, preferably 5.  We also recommend a diet low in fat and sugar / carbohydrates.  Inactivity, poor dietary choices and obesity can cause diabetes, heart attack, stroke, and kidney damage, among others.     ALCOHOL AND SMOKING:  Women should limit their alcohol intake to no more than 7 drinks/beers/glasses of wine (combined, not each!) per week. Moderation of alcohol intake to this level decreases your risk of breast cancer and liver damage.  ( And of course, no recreational drugs are part of a healthy lifestyle.)  Also, you should not be smoking at all or even being exposed to second hand smoke. Most people know smoking can cause cancer, and various heart and lung diseases, but did you know it also contributes to weakening of your bones?  Aging of your skin?  Yellowing of your teeth and nails?   CALCIUM AND VITAMIN D:  Adequate intake of calcium and Vitamin D are recommended.  The recommendations for exact amounts of these supplements seem to change often, but generally speaking 600 mg of calcium (either carbonate or citrate) and 800 units of Vitamin D per day seems prudent. Certain women may benefit from higher intake of Vitamin D.  If you are among these women, your doctor will have told you during your visit.     PAP SMEARS:  Pap smears, to check for cervical cancer or precancers,  have traditionally been done yearly, although recent scientific advances have shown that most women can have pap smears less often.  However, every woman still should have a physical exam from her gynecologist or primary care physician every year. It will include a breast check, inspection of the vulva and vagina to check for abnormal growths or skin changes, a visual exam of the cervix, and then an exam to evaluate the size and shape of the uterus and ovaries.  And after 27 years of age, a rectal exam is indicated to check for rectal cancers. We will also provide age appropriate advice  regarding health maintenance, like when you should have certain vaccines, screening for sexually transmitted diseases, bone density testing, colonoscopy, mammograms, etc.    MAMMOGRAMS:  All women over 30 years old should have a yearly mammogram. Many facilities now offer a "3D" mammogram, which may cost around $50 extra out of pocket. If possible,  we recommend you accept the option to have the 3D mammogram performed.  It both reduces the number of women who will be called back for extra views which then turn out to be normal, and it is better than the routine mammogram at detecting truly abnormal areas.     COLONOSCOPY:  Colonoscopy to screen for colon cancer is recommended for all women at age 73.  We know, you hate the idea of the prep.  We agree, BUT, having colon cancer and not knowing it is worse!!  Colon cancer so often starts as a polyp that can be seen and removed at colonscopy, which can quite literally save your life!  And if your first colonoscopy is normal and you have no family history of colon cancer, most women don't have to have it again for 10 years.  Once every ten years, you can do something that may end up saving your life, right?  We will be happy to help you get it scheduled when you are ready.  Be sure to check your insurance coverage so you understand how much it will cost.  It may be covered as a preventative service at no cost, but you should check your particular policy.

## 2020-04-15 NOTE — Progress Notes (Signed)
Female Physical  Impression and Recommendations:    1. Encounter for wellness examination   2. Healthcare maintenance   3. Health education/counseling   4. BMI 38.0-38.9,adult   5. Tiredness   6. Sleep difficulties   7. Stress and adjustment reaction   8. Morbid obesity ([place])   9. PCOS (polycystic ovarian syndrome)     Of note, this is my first time meeting patient.  Patient is new to me and was previously being cared for at our office by an NP, who no longer works at [employer].   Will be seen by Madison Reid, PA-C in future.   1) Anticipatory Guidance: Discussed skin CA prevention and sunscreen when outside along with skin surveillance; eating a balanced and modest diet; physical activity at least 25 minutes per day or minimum of 150 min/ week moderate to intense activity.   2) Immunizations / Screenings / Labs:   All immunizations are up-to-date per recommendations or will be updated today if pt allows.    - Patient understands with dental and vision screens they will schedule independently.  - Will obtain CBC, CMP, HgA1c, Lipid panel, TSH and vit D when fasting, if not already done past 12 mo/ recently   - Patient followed by OBGYN Madison Cooper for female care. - Patient requests that records be sent to Madison Cooper.  - Pap smear up to date, WNL last check, followed by Madison Cooper.  - Gardasil up to date.  - Need for TDAP.  See orders.  - Education provided to patient today regarding screenings and immunizations.   3) Preventative Maintenance & Weight Counseling - Body mass index is 38.94 kg/m: - BMI meaning discussed with patient.  Told patient to think about it as a "medical risk stratification measurement" and how increasing BMI is associated with increasing risk/ or worsening state of various diseases such as sleep apnea, hypertension, hyperlipidemia, diabetes, premature OA, depression etc.  - Discussed goal to improve diet habits to improve overall  feelings of well being and objective health data. Improve nutrient density of diet through increasing intake of fruits and vegetables and decreasing saturated fats, white flour products and refined sugars.  - Advised patient to continue working toward exercising to improve overall mental, physical, and emotional health.    - Reviewed the "spokes of the wheel" of wellbeing.  Stressed the importance of ongoing prudent habits, including regular exercise, appropriate sleep hygiene, healthful dietary habits, and prayer/meditation to relax.  - Encouraged patient to engage in daily physical activity as tolerated, especially a formal exercise routine.  Recommended that the patient eventually strive for at least 150 minutes of moderate cardiovascular activity per week according to guidelines established by the [facility].    - Patient should also consume adequate amounts of water.  - Health counseling performed.  All questions answered.   Recommendations - Given patient's concerns regarding tiredness/sleepiness, if lab work today returns normal, advised patient to consider sleep study near future for evaluation of sleep apnea.  - Discussed that if desired, patient can establish with [facility] for additional assistance with weight loss.  Patient will let us know.   Orders Placed This Encounter  Procedures  . CBC  . Comprehensive metabolic panel  . TSH  . T4, free  . Hemoglobin A1c  . Lipid panel  . VITAMIN D 25 Hydroxy (Vit-D Deficiency, Fractures)  . Insulin, random     Return in 1 year (on 04/15/2021)  for CPE/ yrly physical, come fasting.  Otherwise sooner is concerns.   Reminded pt important of f-up preventative CPE in 1 year.  Reminded pt again, this is in addition to any chronic care visits.    Gross side effects, risk and benefits, and alternatives of medications discussed with patient.  Patient is aware that all medications have potential side effects and we are unable to  predict every side effect or drug-drug interaction that may occur.  Expresses verbal understanding and consents to current therapy plan and treatment regimen.  F-up preventative CPE in 1 year- reminded pt again, this is in addition to any chronic care visits.    Please see orders placed and AVS handed out to patient at the end of our visit for further patient instructions/ counseling done pertaining to today's office visit.   This case required medical decision making of at least moderate complexity.  This document serves as a record of services personally performed by Madison Lot, DO. It was created on her behalf by Madison Cooper, a trained medical scribe. The creation of this record is based on the scribe's personal observations and the provider's statements to them.    The above documentation from Madison Cooper, medical scribe, has been reviewed by Madison Cooper, D.O.     Subjective:    I, Madison Cooper, am serving as Neurosurgeon for Madison Cooper.   CPE HPI: Madison Cooper is a 29 y.o. female who presents to [facility] today a yearly health maintenance exam.  Lives with husband at home.  Health Maintenance Summary  - Reviewed and updated, unless pt declines services.  Last Cologuard or Colonoscopy:  Begin at 52. Family history of Colon CA:  No.  Tobacco History Reviewed:  Y; never smoker.  Alcohol and/or drug use:  No concerns; no use Exercise Habits:  Not meeting AHA guidelines. Dental Home:  Typically goes to the dentist every six months. Eye exams:  Has not been back to her specialist in about two years. Dermatology home: does not follow up with dermatology.  Female Health:  Follows up with Madison Cooper of [facility]. PAP Smear - last known results:  Monitored by OBGYN.  Last had a pap smear with Madison Cooper which was WNL; no h/o ABN. STD concerns:  None, monogamous with husband Birth control method:  Currently trying for  baby. Menses regular:  Notes she is not ovulating, and hasn't had a period since March 7.  Her OBGYN has provided the patient with Provera, to help jumpstart her period. Lumps or breast concerns:  None reported. Breast Cancer Family History:  No.  Denies history of female cancers that she's aware of.  Additional concerns beyond health maintenance issues:   She has not obtained her COVID-19 vaccine and states she is probably not going to get it.  Notes that she has always had trouble falling asleep / staying asleep.  Asked Orpha Bur to check her thyroid last year, and everything was normal.  Says "I'm even more tired this year than I was last year, so I don't know."  Denies snoring; "I've had all my adenoids and tonsils removed, and I haven't snored since then."  Sleeps until 7.30, 8 on her days off, and goes to bed around 8:30.  On days she works, she feels she obtains about 8 hours of sleep.  Says "I feel like I sleep too much, compared to too little."  Her stress levels will be reduced after she  finishes her bachelor's degree in two weeks.  Notes that working on this has been very stressful.    Immunization History  Administered Date(s) Administered  . Influenza-Unspecified 09/15/2015, 09/25/2018, 10/20/2019  . Tdap 09/15/2010     Health Maintenance  Topic Date Due  . COVID-19 Vaccine (1) Never done  . INFLUENZA VACCINE  07/26/2020  . TETANUS/TDAP  09/15/2020  . PAP-Cervical Cytology Screening  01/01/2022  . PAP SMEAR-Modifier  01/01/2022  . HIV Screening  Completed     Wt Readings from Last 3 Encounters:  04/15/20 279 lb 3.2 oz (126.6 kg)  06/24/19 262 lb (118.8 kg)  03/11/19 262 lb 1.6 oz (118.9 kg)   BP Readings from Last 3 Encounters:  04/15/20 115/76  04/04/20 125/81  02/28/20 138/85   Pulse Readings from Last 3 Encounters:  04/15/20 86  04/04/20 98  02/28/20 100     Past Medical History:  Diagnosis Date  . Allergy   . Spondylisthesis       Past  Surgical History:  Procedure Laterality Date  . ANKLE SURGERY Left 2010  . MANDIBLE SURGERY  11/15/12  . TONSILLECTOMY AND ADENOIDECTOMY  1/12  . TYMPANOSTOMY TUBE PLACEMENT    . WISDOM TOOTH EXTRACTION  12/11      Family History  Problem Relation Age of Onset  . Cancer Mother        basal cell  . Kidney disease Mother        kidney stones  . Alcohol abuse Father   . Heart attack Father   . Gout Father   . Alcohol abuse Brother   . Depression Brother   . Heart disease Maternal Grandfather   . Diabetes Maternal Grandfather   . Heart attack Maternal Grandfather   . Hypertension Maternal Grandfather   . Heart disease Paternal Grandfather   . Diabetes Paternal Grandfather   . Cancer Paternal Grandfather        lymphoma  . Hypothyroidism Maternal Grandmother   . Alzheimer's disease Paternal Grandmother   . Parkinson's disease Paternal Grandmother   . Scoliosis Paternal Grandmother       Social History   Substance and Sexual Activity  Drug Use No  ,   Social History   Substance and Sexual Activity  Alcohol Use No  . Alcohol/week: 0.0 standard drinks   Comment: Less than 1   ,   Social History   Tobacco Use  Smoking Status Never Smoker  Smokeless Tobacco Never Used  ,   Social History   Substance and Sexual Activity  Sexual Activity Yes  . Partners: Male  . Birth control/protection: None   Comment: NuvaRing    Current Outpatient Medications on File Prior to Visit  Medication Sig Dispense Refill  . acetaminophen (TYLENOL) 325 MG tablet Take 650 mg by mouth every 6 (six) hours as needed.    Marland Kitchen ibuprofen (ADVIL) 200 MG tablet Take 200 mg by mouth every 6 (six) hours as needed.    . loratadine (CLARITIN) 10 MG tablet Take 10 mg by mouth daily.    . meloxicam (MOBIC) 15 MG tablet Take 1 tablet (15 mg total) by mouth daily as needed for pain. 30 tablet 0  . OVER THE COUNTER MEDICATION myo-in ositol plus and D chiro inositol supplement 4 pill daily    .  scopolamine (TRANSDERM-SCOP, 1.5 MG,) 1 MG/3DAYS Place 1 patch (1.5 mg total) onto the skin every 3 (three) days. 4 patch 0  . tiZANidine (ZANAFLEX) 4 MG tablet Take  1 tablet (4 mg total) by mouth every 6 (six) hours as needed for muscle spasms. 20 tablet 0  . Multiple Vitamins-Minerals (MULTIVITAMIN GUMMIES ADULT PO) Take 1 each by mouth daily.    . [DISCONTINUED] escitalopram (LEXAPRO) 10 MG tablet Take 1 tablet (10 mg total) by mouth daily. 90 tablet 1  . [DISCONTINUED] etonogestrel-ethinyl estradiol (NUVARING) 0.12-0.015 MG/24HR vaginal ring Use for 3 weeks, then remove for 1 week. 3 each 4   No current facility-administered medications on file prior to visit.    Allergies: Diclofenac, Adhesive [tape], and Sulfa antibiotics  Review of Systems: General:   Denies fever, chills, unexplained weight loss.  Optho/Auditory:   Denies visual changes, blurred vision/LOV Respiratory:   Denies SOB, DOE more than baseline levels.   Cardiovascular:   Denies chest pain, palpitations, new onset peripheral edema  Gastrointestinal:   Denies nausea, vomiting, diarrhea.  Genitourinary: Denies dysuria, freq/ urgency, flank pain or discharge from genitals.  Endocrine:     Denies hot or cold intolerance, polyuria, polydipsia. Musculoskeletal:   Denies unexplained myalgias, joint swelling, unexplained arthralgias, gait problems.  Skin:  Denies rash, suspicious lesions Neurological:     Denies dizziness, unexplained weakness, numbness  Psychiatric/Behavioral:   Denies mood changes, suicidal or homicidal ideations, hallucinations    Objective:    Blood pressure 115/76, pulse 86, temperature 98.6 F (37 C), temperature source Oral, height 5\' 11"  (1.803 m), weight 279 lb 3.2 oz (126.6 kg), SpO2 99 %. Body mass index is 38.94 kg/m. General Appearance:    Alert, cooperative, no distress, appears stated age  Head:    Normocephalic, without obvious abnormality, atraumatic  Eyes:    PERRL, conjunctiva/corneas  clear, EOM's intact, fundi    benign, both eyes  Ears:    Normal TM's and external ear canals, both ears  Nose:   Nares normal, septum midline, mucosa normal, no drainage    or sinus tenderness  Throat:   Lips w/o lesion, mucosa moist, and tongue normal; teeth and   gums normal  Neck:   Supple, symmetrical, trachea midline, no adenopathy;    thyroid:  no enlargement/tenderness/nodules; no carotid   bruit or JVD  Back:     Symmetric, no curvature, ROM normal, no CVA tenderness  Lungs:     Clear to auscultation bilaterally, respirations unlabored, no       Wh/ R/ R  Chest Wall:    No tenderness or gross deformity; normal excursion   Heart:    Regular rate and rhythm, S1 and S2 normal, no murmur, rub   or gallop  Breast Exam:    Deferred to OBGYN.  Abdomen:     Soft, non-tender, bowel sounds active all four quadrants, NO   G/R/R, no masses, no organomegaly  Genitalia:    Deferred to OBGYN.  Rectal:    Deferred to OBGYN.  Extremities:   Extremities normal, atraumatic, no cyanosis or gross edema  Pulses:   2+ and symmetric all extremities  Skin:   Warm, dry, Skin color, texture, turgor normal, no obvious rashes or lesions Psych: No HI/SI, judgement and insight good, Euthymic mood. Full Affect.  Neurologic:   CNII-XII intact, normal strength, sensation and reflexes    Throughout

## 2020-04-16 ENCOUNTER — Encounter: Payer: Self-pay | Admitting: Family Medicine

## 2020-04-16 LAB — COMPREHENSIVE METABOLIC PANEL
ALT: 38 IU/L — ABNORMAL HIGH (ref 0–32)
AST: 18 IU/L (ref 0–40)
Albumin/Globulin Ratio: 1.9 (ref 1.2–2.2)
Albumin: 4.3 g/dL (ref 3.9–5.0)
Alkaline Phosphatase: 72 IU/L (ref 39–117)
BUN/Creatinine Ratio: 14 (ref 9–23)
BUN: 11 mg/dL (ref 6–20)
Bilirubin Total: 0.4 mg/dL (ref 0.0–1.2)
CO2: 20 mmol/L (ref 20–29)
Calcium: 9.1 mg/dL (ref 8.7–10.2)
Chloride: 104 mmol/L (ref 96–106)
Creatinine, Ser: 0.8 mg/dL (ref 0.57–1.00)
GFR calc Af Amer: 118 mL/min/{1.73_m2} (ref >59)
GFR calc non Af Amer: 102 mL/min/{1.73_m2} (ref >59)
Globulin, Total: 2.3 g/dL (ref 1.5–4.5)
Glucose: 97 mg/dL (ref 65–99)
Potassium: 4.5 mmol/L (ref 3.5–5.2)
Sodium: 137 mmol/L (ref 134–144)
Total Protein: 6.6 g/dL (ref 6.0–8.5)

## 2020-04-16 LAB — LIPID PANEL
Chol/HDL Ratio: 3.3 ratio (ref 0.0–4.4)
Cholesterol, Total: 150 mg/dL (ref 100–199)
HDL: 45 mg/dL (ref >39)
LDL Chol Calc (NIH): 90 mg/dL (ref 0–99)
Triglycerides: 77 mg/dL (ref 0–149)
VLDL Cholesterol Cal: 15 mg/dL (ref 5–40)

## 2020-04-16 LAB — TSH: TSH: 0.036 u[IU]/mL — ABNORMAL LOW (ref 0.450–4.500)

## 2020-04-16 LAB — VITAMIN D 25 HYDROXY (VIT D DEFICIENCY, FRACTURES): Vit D, 25-Hydroxy: 40.4 ng/mL (ref 30.0–100.0)

## 2020-04-16 LAB — CBC
Hematocrit: 43.4 % (ref 34.0–46.6)
Hemoglobin: 14.7 g/dL (ref 11.1–15.9)
MCH: 28.9 pg (ref 26.6–33.0)
MCHC: 33.9 g/dL (ref 31.5–35.7)
MCV: 85 fL (ref 79–97)
Platelets: 271 10*3/uL (ref 150–450)
RBC: 5.08 x10E6/uL (ref 3.77–5.28)
RDW: 12.6 % (ref 11.7–15.4)
WBC: 7 10*3/uL (ref 3.4–10.8)

## 2020-04-16 LAB — HEMOGLOBIN A1C
Est. average glucose Bld gHb Est-mCnc: 111 mg/dL
Hgb A1c MFr Bld: 5.5 % (ref 4.8–5.6)

## 2020-04-16 LAB — T4, FREE: Free T4: 1.44 ng/dL (ref 0.82–1.77)

## 2020-04-17 LAB — INSULIN, RANDOM: INSULIN: 22 u[IU]/mL (ref 2.6–24.9)

## 2020-04-17 LAB — SPECIMEN STATUS REPORT

## 2020-04-19 ENCOUNTER — Encounter: Payer: Self-pay | Admitting: Family Medicine

## 2020-04-22 MED FILL — metFORMIN HCL ER 500 MG TB2: 500 | 33 days supply | Qty: 60 | Fill #0

## 2020-05-18 MED FILL — metFORMIN HCL ER 500 MG TB2: 500 | 30 days supply | Qty: 60 | Fill #1

## 2020-06-30 ENCOUNTER — Other Ambulatory Visit (HOSPITAL_COMMUNITY): Payer: Self-pay | Admitting: Obstetrics & Gynecology

## 2020-06-30 MED FILL — metFORMIN HCL ER 500 MG TB2: 500 | 30 days supply | Qty: 60 | Fill #0

## 2020-06-30 MED FILL — PHENTERMINE 37.5 MG TABLET: 37.5 | 30 days supply | Qty: 30 | Fill #0

## 2020-06-30 MED FILL — MEDROXYPROGESTERONE 10 MG T: 10 | 90 days supply | Qty: 21 | Fill #0

## 2020-07-02 ENCOUNTER — Telehealth: Payer: Self-pay | Admitting: Physician Assistant

## 2020-07-02 ENCOUNTER — Other Ambulatory Visit: Payer: Self-pay

## 2020-07-02 MED FILL — LEVOTHYROXINE SODIUM 25 MCG: 25 | 30 days supply | Qty: 30 | Fill #0

## 2020-07-02 NOTE — Telephone Encounter (Signed)
Meds added to pt's medication list.  Please advise, after reviewing lab test results, if pt needs OV or telehealth appt at this time.  Tiajuana Amass, CMA

## 2020-07-02 NOTE — Telephone Encounter (Signed)
Patient cld states been trying to send note to [name] via Mychart but only Dr. Synthia Cooper nm appears as PCP--   Pt says her OB/GYN /Madison Cooper(not [place]) is has placed her on several medication she wanted Madison Cooper to be aware of  1) Metformin 2) phentermine 3)Synthroid  Madison Cooper office is faxing over pt's recent lab results & pt is wondering if OV or Telehealth needed w/ PCP to F/U on health concerns. (lab results rcvd 07/02/20 & placed in provider basket fr review)  --Forwarding message to med asst for review w/ provider & to advise of any requirements.  --glh

## 2020-07-03 NOTE — Telephone Encounter (Signed)
Spoke with [employer]. If patient would like to discuss these issues with Kandis Cocking she will need to schedule an OV or telehealth.    Please call patient to schedule apt. AS, CMA

## 2020-07-04 ENCOUNTER — Encounter: Payer: Self-pay | Admitting: Physician Assistant

## 2020-07-04 IMAGING — DX DG FOOT COMPLETE 3+V*R*
3 series · 3 of 3 positions shown · non-contrast
Comparison: None.

CLINICAL DATA: Right foot injury playing softball yesterday. Pain
plantar medial aspect first toe.

EXAM:
RIGHT FOOT COMPLETE - 3+ VIEW

[foot supine dp]
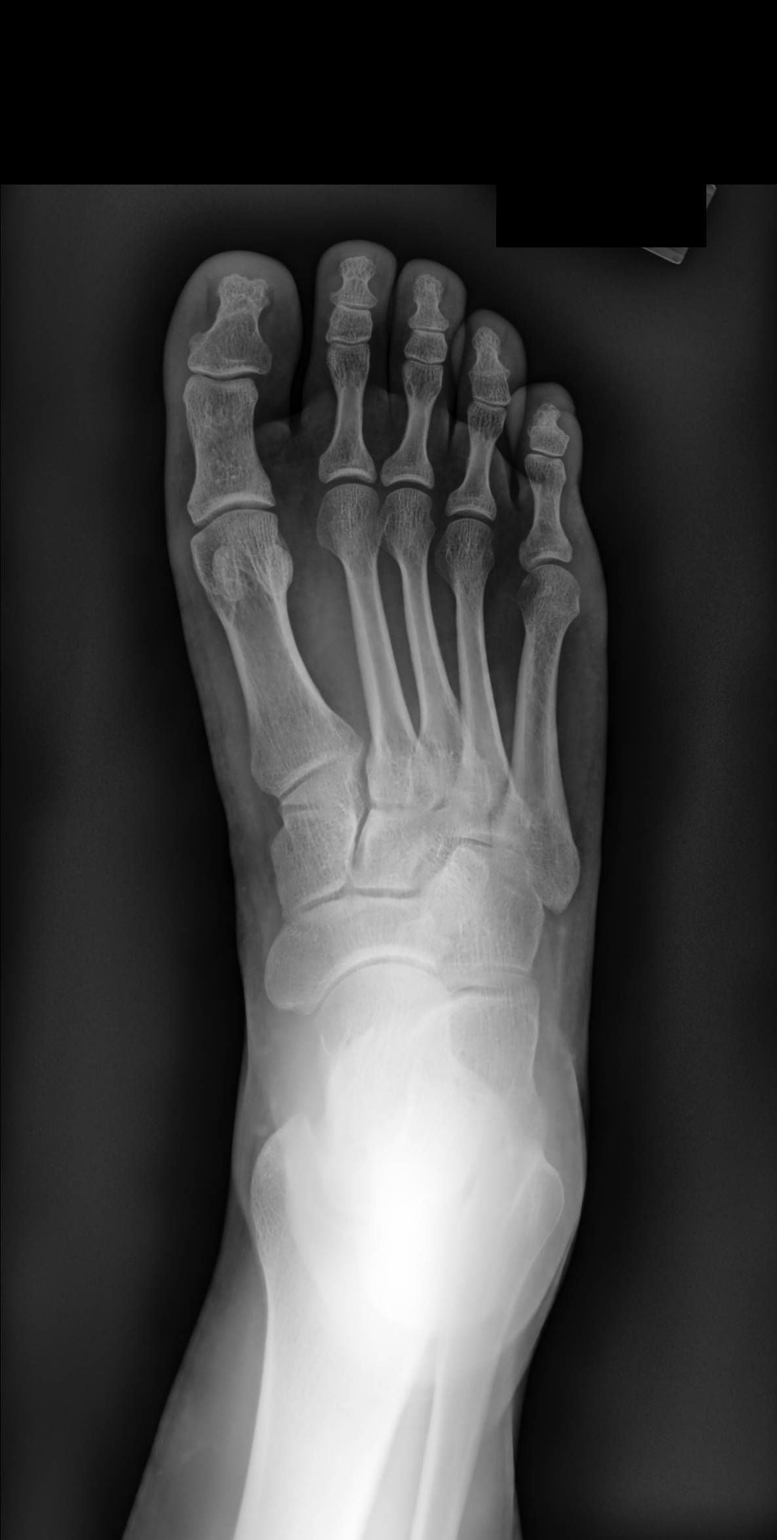

[foot medial oblique]
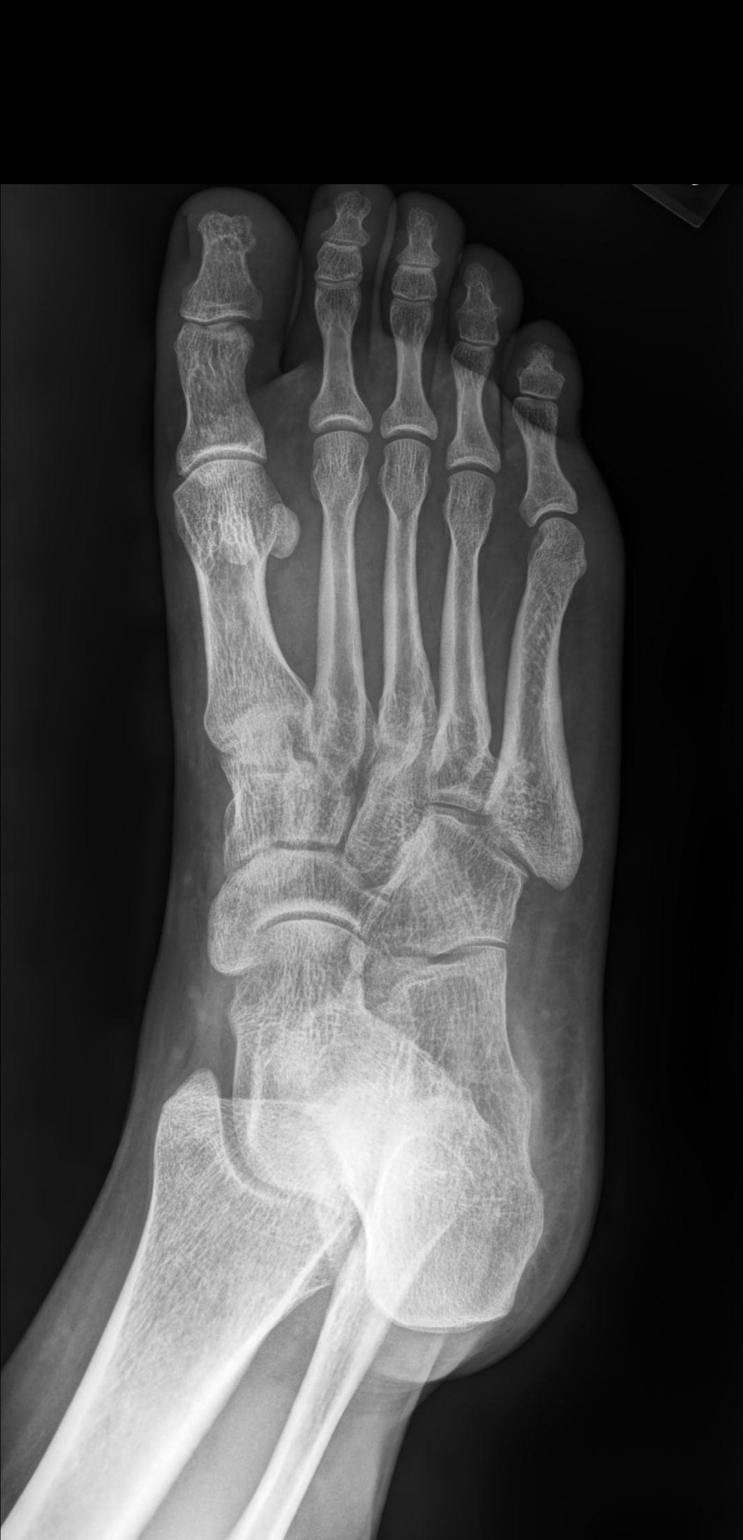

[foot supine lat]
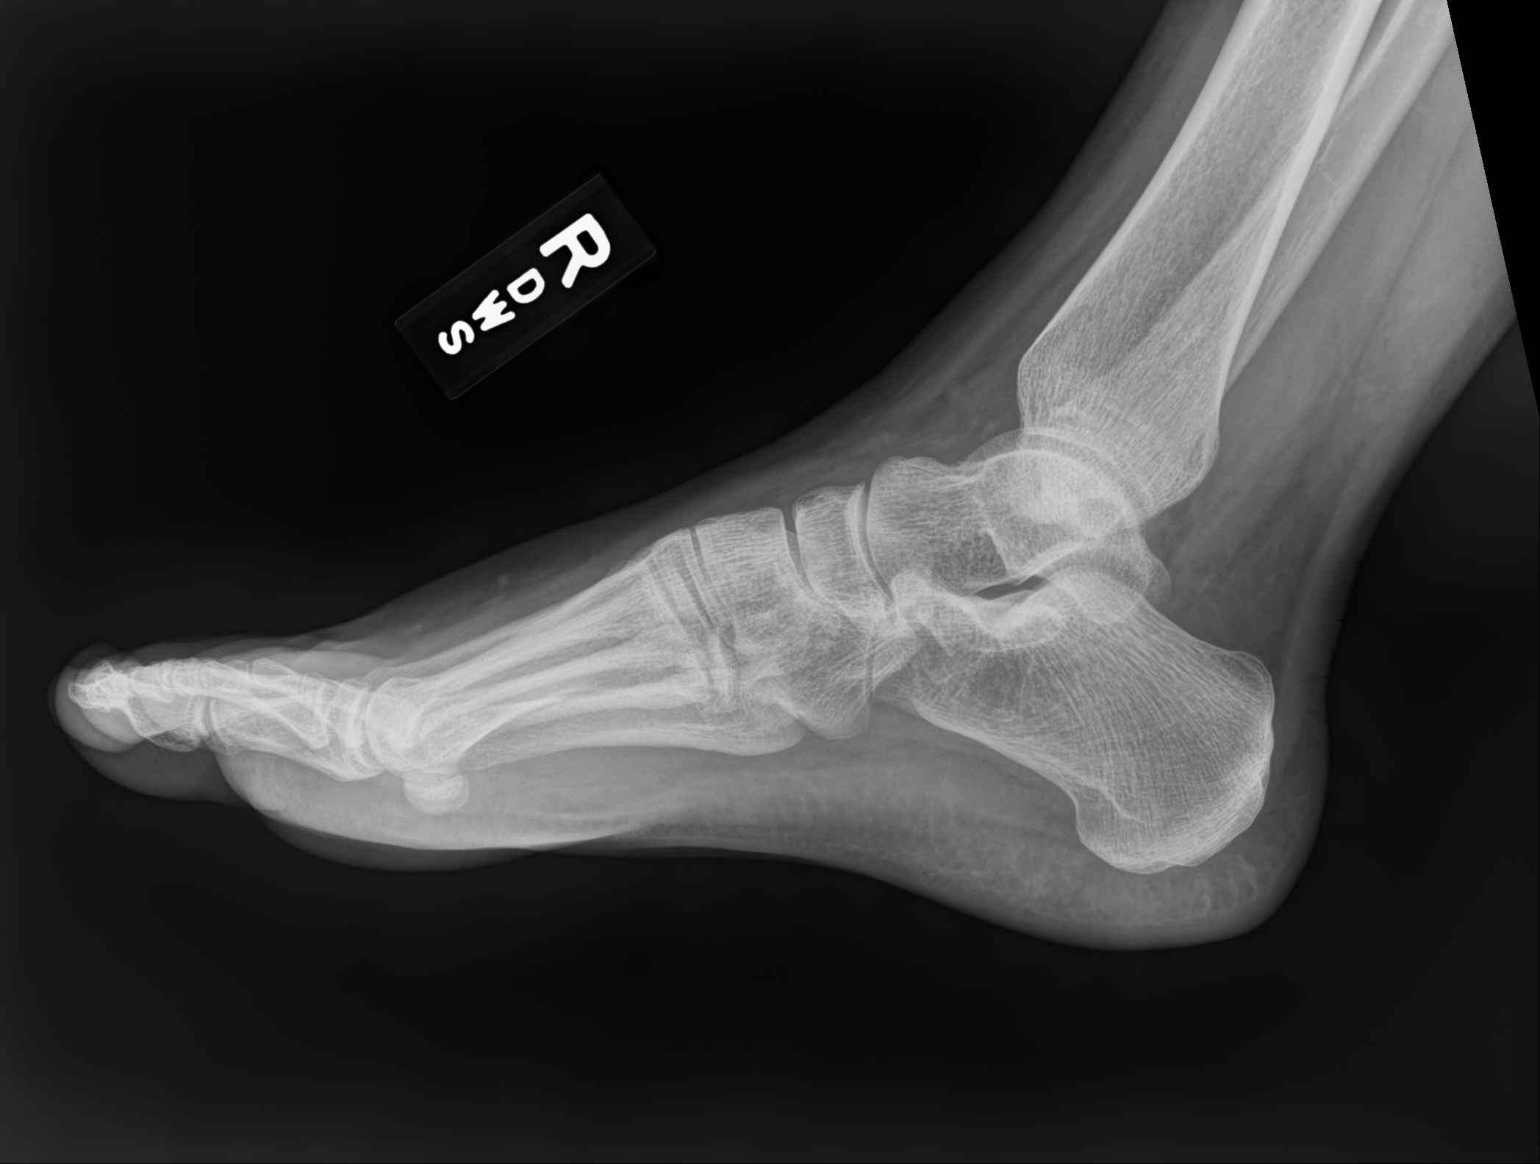

[3 of 3 positions shown; findings below may reference images not displayed]

FINDINGS: There is no evidence of fracture or dislocation. There is no
evidence of arthropathy or other focal bone abnormality. Soft
tissues are unremarkable.
IMPRESSION: No acute findings.

## 2020-07-06 ENCOUNTER — Other Ambulatory Visit: Payer: Self-pay | Admitting: Physician Assistant

## 2020-07-06 DIAGNOSIS — E039 Hypothyroidism, unspecified: Secondary | ICD-10-CM

## 2020-07-29 ENCOUNTER — Encounter: Payer: Self-pay | Admitting: Physician Assistant

## 2020-07-31 MED FILL — PHENTERMINE 37.5 MG TABLET: 37.5 | 30 days supply | Qty: 30 | Fill #1

## 2020-07-31 MED FILL — metFORMIN HCL ER 500 MG TB2: 500 | 30 days supply | Qty: 60 | Fill #1

## 2020-07-31 MED FILL — LEVOTHYROXINE SODIUM 25 MCG: 25 | 30 days supply | Qty: 30 | Fill #1

## 2020-08-05 ENCOUNTER — Encounter: Payer: Self-pay | Admitting: Physician Assistant

## 2020-08-06 ENCOUNTER — Other Ambulatory Visit: Payer: Self-pay

## 2020-08-06 ENCOUNTER — Ambulatory Visit (INDEPENDENT_AMBULATORY_CARE_PROVIDER_SITE_OTHER): Payer: No Typology Code available for payment source

## 2020-08-06 DIAGNOSIS — Z23 Encounter for immunization: Secondary | ICD-10-CM | POA: Diagnosis not present

## 2020-08-06 DIAGNOSIS — Z111 Encounter for screening for respiratory tuberculosis: Secondary | ICD-10-CM

## 2020-08-06 NOTE — Progress Notes (Signed)
Pt here for quantiferon gold because pt is allergic to PPD and need screening for TB for employment.  Pt also needs tdap.  Tiajuana Amass, CMA

## 2020-08-09 LAB — QUANTIFERON-TB GOLD PLUS
QuantiFERON Mitogen Value: >10 IU/mL
QuantiFERON Nil Value: 0.01 IU/mL
QuantiFERON TB1 Ag Value: 0.02 IU/mL
QuantiFERON TB2 Ag Value: 0.01 IU/mL
QuantiFERON-TB Gold Plus: NEGATIVE

## 2020-08-25 MED FILL — LEVOTHYROXINE SODIUM 25 MCG: 25 | 30 days supply | Qty: 40 | Fill #0

## 2020-08-25 MED FILL — metFORMIN HCL ER 500 MG TB2: 500 | 30 days supply | Qty: 60 | Fill #2

## 2020-09-07 MED FILL — PHENTERMINE 37.5 MG TABLET: 37.5 | 30 days supply | Qty: 30 | Fill #0

## 2020-09-17 ENCOUNTER — Other Ambulatory Visit (HOSPITAL_COMMUNITY): Payer: Self-pay | Admitting: Endocrinology

## 2020-09-17 MED FILL — LEVOTHYROXINE SODIUM 50 MCG: 50 | 30 days supply | Qty: 30 | Fill #0

## 2020-10-05 MED FILL — metFORMIN HCL ER 500 MG TB2: 500 | 30 days supply | Qty: 60 | Fill #3

## 2020-10-16 ENCOUNTER — Encounter (HOSPITAL_COMMUNITY): Payer: Self-pay | Admitting: Obstetrics and Gynecology

## 2020-10-16 ENCOUNTER — Inpatient Hospital Stay (HOSPITAL_COMMUNITY)
Admission: AD | Admit: 2020-10-16 | Discharge: 2020-10-16 | Disposition: A | Discharge status: Home / Self Care | Payer: No Typology Code available for payment source | Attending: Obstetrics and Gynecology | Admitting: Obstetrics and Gynecology

## 2020-10-16 ENCOUNTER — Other Ambulatory Visit (HOSPITAL_COMMUNITY): Payer: Self-pay | Admitting: Student

## 2020-10-16 ENCOUNTER — Inpatient Hospital Stay (HOSPITAL_COMMUNITY): Payer: No Typology Code available for payment source

## 2020-10-16 ENCOUNTER — Other Ambulatory Visit: Payer: Self-pay

## 2020-10-16 DIAGNOSIS — Z3A08 8 weeks gestation of pregnancy: Secondary | ICD-10-CM

## 2020-10-16 DIAGNOSIS — Z3491 Encounter for supervision of normal pregnancy, unspecified, first trimester: Secondary | ICD-10-CM

## 2020-10-16 DIAGNOSIS — O99281 Endocrine, nutritional and metabolic diseases complicating pregnancy, first trimester: Secondary | ICD-10-CM | POA: Insufficient documentation

## 2020-10-16 DIAGNOSIS — Z8249 Family history of ischemic heart disease and other diseases of the circulatory system: Secondary | ICD-10-CM | POA: Insufficient documentation

## 2020-10-16 DIAGNOSIS — Z79899 Other long term (current) drug therapy: Secondary | ICD-10-CM | POA: Insufficient documentation

## 2020-10-16 DIAGNOSIS — Z888 Allergy status to other drugs, medicaments and biological substances status: Secondary | ICD-10-CM | POA: Diagnosis not present

## 2020-10-16 DIAGNOSIS — O418X10 Other specified disorders of amniotic fluid and membranes, first trimester, not applicable or unspecified: Secondary | ICD-10-CM

## 2020-10-16 DIAGNOSIS — Z3A09 9 weeks gestation of pregnancy: Secondary | ICD-10-CM | POA: Insufficient documentation

## 2020-10-16 DIAGNOSIS — O468X1 Other antepartum hemorrhage, first trimester: Secondary | ICD-10-CM | POA: Diagnosis not present

## 2020-10-16 DIAGNOSIS — O208 Other hemorrhage in early pregnancy: Secondary | ICD-10-CM | POA: Diagnosis not present

## 2020-10-16 DIAGNOSIS — O26891 Other specified pregnancy related conditions, first trimester: Secondary | ICD-10-CM | POA: Insufficient documentation

## 2020-10-16 DIAGNOSIS — Z8349 Family history of other endocrine, nutritional and metabolic diseases: Secondary | ICD-10-CM | POA: Diagnosis not present

## 2020-10-16 DIAGNOSIS — E039 Hypothyroidism, unspecified: Secondary | ICD-10-CM | POA: Diagnosis not present

## 2020-10-16 DIAGNOSIS — Z7984 Long term (current) use of oral hypoglycemic drugs: Secondary | ICD-10-CM | POA: Diagnosis not present

## 2020-10-16 DIAGNOSIS — R110 Nausea: Secondary | ICD-10-CM | POA: Diagnosis not present

## 2020-10-16 DIAGNOSIS — Z791 Long term (current) use of non-steroidal anti-inflammatories (NSAID): Secondary | ICD-10-CM | POA: Insufficient documentation

## 2020-10-16 DIAGNOSIS — Z882 Allergy status to sulfonamides status: Secondary | ICD-10-CM | POA: Insufficient documentation

## 2020-10-16 DIAGNOSIS — O209 Hemorrhage in early pregnancy, unspecified: Secondary | ICD-10-CM

## 2020-10-16 HISTORY — DX: Hypothyroidism, unspecified: E03.9

## 2020-10-16 HISTORY — DX: Metabolic syndrome: E88.81

## 2020-10-16 HISTORY — DX: Insulin resistance, unspecified: E88.819

## 2020-10-16 LAB — ABO/RH: ABO/RH(D): O POS

## 2020-10-16 LAB — URINALYSIS, ROUTINE W REFLEX MICROSCOPIC
Bacteria, UA: NONE SEEN
Bilirubin Urine: NEGATIVE
Glucose, UA: NEGATIVE mg/dL
Ketones, ur: NEGATIVE mg/dL
Leukocytes,Ua: NEGATIVE
Nitrite: NEGATIVE
Protein, ur: NEGATIVE mg/dL
Specific Gravity, Urine: 1.008 (ref 1.005–1.030)
pH: 7 (ref 5.0–8.0)

## 2020-10-16 LAB — CBC
HCT: 42.8 % (ref 36.0–46.0)
Hemoglobin: 14.2 g/dL (ref 12.0–15.0)
MCH: 29 pg (ref 26.0–34.0)
MCHC: 33.2 g/dL (ref 30.0–36.0)
MCV: 87.3 fL (ref 80.0–100.0)
Platelets: 307 10*3/uL (ref 150–400)
RBC: 4.9 MIL/uL (ref 3.87–5.11)
RDW: 12.5 % (ref 11.5–15.5)
WBC: 13.3 10*3/uL — ABNORMAL HIGH (ref 4.0–10.5)
nRBC: 0 % (ref 0.0–0.2)

## 2020-10-16 LAB — WET PREP, GENITAL
Clue Cells Wet Prep HPF POC: NONE SEEN
Sperm: NONE SEEN
Trich, Wet Prep: NONE SEEN
Yeast Wet Prep HPF POC: NONE SEEN

## 2020-10-16 LAB — HCG, QUANTITATIVE, PREGNANCY: hCG, Beta Chain, Quant, S: 104960 m[IU]/mL — ABNORMAL HIGH (ref <5)

## 2020-10-16 LAB — POCT PREGNANCY, URINE: Preg Test, Ur: POSITIVE — AB

## 2020-10-16 NOTE — Discharge Instructions (Signed)

## 2020-10-16 NOTE — MAU Provider Note (Signed)
History     CSN: [id number]  Arrival date and time: 10/16/20 3557   First Provider Initiated Contact with Patient 10/16/20 [phone]      Chief Complaint  Patient presents with  . Vaginal Bleeding   Madison Cooper is a 29 y.o. G1P0000 at [redacted]w[redacted]d who presents with vaginal bleeding. Reports brown spotting a few days ago. This morning saw bright red spotting. Not bleeding into a pad or passing blood clots. No recent intercourse or exams. Had pregnancy confirmation at [facility] & is scheduled for labs/ultrasound next Tuesday.  Does reports some lower abdominal cramping that has been ongoing since she found out she was pregnant last month; no changes in pain.  Denies dysuria, vomiting, diarrhea, or vaginal discharge.   Location: abdomen Quality: cramping, aching Severity: 2/10 on pain scale Duration: 1 month Timing: intermittent Modifying factors: none Associated signs and symptoms: vaginal bleeding, nausea    OB History    Gravida  1   Para  0   Term  0   Preterm  0   AB  0   Living  0     SAB  0   TAB  0   Ectopic  0   Multiple  0   Live Births  0           Past Medical History:  Diagnosis Date  . Allergy   . Hypothyroidism   . Insulin resistance   . Spondylisthesis     Past Surgical History:  Procedure Laterality Date  . ANKLE SURGERY Left 2010  . MANDIBLE SURGERY  11/15/12  . TONSILLECTOMY AND ADENOIDECTOMY  1/12  . TYMPANOSTOMY TUBE PLACEMENT    . WISDOM TOOTH EXTRACTION  12/11    Family History  Problem Relation Age of Onset  . Cancer Mother        basal cell  . Kidney disease Mother        kidney stones  . Alcohol abuse Father   . Heart attack Father   . Gout Father   . Alcohol abuse Brother   . Depression Brother   . Heart disease Maternal Grandfather   . Diabetes Maternal Grandfather   . Heart attack Maternal Grandfather   . Hypertension Maternal Grandfather   . Heart disease Paternal Grandfather   . Diabetes Paternal  Grandfather   . Cancer Paternal Grandfather        lymphoma  . Hypothyroidism Maternal Grandmother   . Alzheimer's disease Paternal Grandmother   . Parkinson's disease Paternal Grandmother   . Scoliosis Paternal Grandmother     Social History   Tobacco Use  . Smoking status: Never Smoker  . Smokeless tobacco: Never Used  Vaping Use  . Vaping Use: Never used  Substance Use Topics  . Alcohol use: No    Alcohol/week: 0.0 standard drinks    Comment: Less than 1   . Drug use: No    Allergies:  Allergies  Allergen Reactions  . Diclofenac Other (See Comments)    Tachypnea, dizziness/lightheadedness   . Adhesive [Tape] Rash  . Sulfa Antibiotics Rash    Medications Prior to Admission  Medication Sig Dispense Refill Last Dose  . levothyroxine (SYNTHROID) 25 MCG tablet Take 25 mcg by mouth daily.   10/16/2020 at Unknown time  . loratadine (CLARITIN) 10 MG tablet Take 10 mg by mouth daily.   10/16/2020 at Unknown time  . metFORMIN (GLUCOPHAGE-XR) 500 MG 24 hr tablet Take 500 mg by mouth 2 (two) times daily.  10/16/2020 at Unknown time  . Multiple Vitamins-Minerals (MULTIVITAMIN GUMMIES ADULT PO) Take 1 each by mouth daily.   10/15/2020 at Unknown time  . acetaminophen (TYLENOL) 325 MG tablet Take 650 mg by mouth every 6 (six) hours as needed.   Unknown at Unknown time  . ibuprofen (ADVIL) 200 MG tablet Take 200 mg by mouth every 6 (six) hours as needed.   Unknown at Unknown time  . OVER THE COUNTER MEDICATION myo-in ositol plus and D chiro inositol supplement 4 pill daily     . tiZANidine (ZANAFLEX) 4 MG tablet Take 1 tablet (4 mg total) by mouth every 6 (six) hours as needed for muscle spasms. 20 tablet 0     Review of Systems  Constitutional: Negative.   Gastrointestinal: Positive for abdominal pain and nausea. Negative for constipation, diarrhea and vomiting.  Genitourinary: Positive for vaginal bleeding. Negative for dysuria and vaginal discharge.   Physical Exam   Blood  pressure 131/75, pulse (!) 101, temperature 99.3 F (37.4 C), temperature source Oral, resp. rate 20, height 5\' 11"  (1.803 m), weight 124.9 kg, last menstrual period 08/10/2020, SpO2 97 %.  Physical Exam Vitals and nursing note reviewed. Exam conducted with a chaperone present.  Constitutional:      General: She is not in acute distress.    Appearance: Normal appearance.  HENT:     Head: Normocephalic and atraumatic.  Pulmonary:     Effort: Pulmonary effort is normal. No respiratory distress.  Abdominal:     Tenderness: There is no abdominal tenderness. There is no guarding or rebound.  Genitourinary:    General: Normal vulva.     Exam position: Lithotomy position.     Cervix: Friability present. No cervical motion tenderness, lesion or cervical bleeding.     Comments: No active bleeding. Cervix friable. Cervix closed Neurological:     Mental Status: She is alert.     MAU Course  Procedures Results for orders placed or performed during the hospital encounter of 10/16/20 (from the past 24 hour(s))  Pregnancy, urine POC     Status: Abnormal   Collection Time: 10/16/20  9:08 AM  Result Value Ref Range   Preg Test, Ur POSITIVE (A) NEGATIVE  Urinalysis, Routine w reflex microscopic Urine, Clean Catch     Status: Abnormal   Collection Time: 10/16/20  9:16 AM  Result Value Ref Range   Color, Urine STRAW (A) YELLOW   APPearance CLEAR CLEAR   Specific Gravity, Urine 1.008 1.005 - 1.030   pH 7.0 5.0 - 8.0   Glucose, UA NEGATIVE NEGATIVE mg/dL   Hgb urine dipstick SMALL (A) NEGATIVE   Bilirubin Urine NEGATIVE NEGATIVE   Ketones, ur NEGATIVE NEGATIVE mg/dL   Protein, ur NEGATIVE NEGATIVE mg/dL   Nitrite NEGATIVE NEGATIVE   Leukocytes,Ua NEGATIVE NEGATIVE   RBC / HPF 0-5 0 - 5 RBC/hpf   WBC, UA 0-5 0 - 5 WBC/hpf   Bacteria, UA NONE SEEN NONE SEEN   Squamous Epithelial / LPF 0-5 0 - 5   Mucus PRESENT   Wet prep, genital     Status: Abnormal   Collection Time: 10/16/20  9:48 AM   Result Value Ref Range   Yeast Wet Prep HPF POC NONE SEEN NONE SEEN   Trich, Wet Prep NONE SEEN NONE SEEN   Clue Cells Wet Prep HPF POC NONE SEEN NONE SEEN   WBC, Wet Prep HPF POC MANY (A) NONE SEEN   Sperm NONE SEEN   CBC  Status: Abnormal   Collection Time: 10/16/20  9:52 AM  Result Value Ref Range   WBC 13.3 (H) 4.0 - 10.5 K/uL   RBC 4.90 3.87 - 5.11 MIL/uL   Hemoglobin 14.2 12.0 - 15.0 g/dL   HCT 07.3 36 - 46 %   MCV 87.3 80.0 - 100.0 fL   MCH 29.0 26.0 - 34.0 pg   MCHC 33.2 30.0 - 36.0 g/dL   RDW 71.0 62.6 - 94.8 %   Platelets 307 150 - 400 K/uL   nRBC 0.0 0.0 - 0.2 %  ABO/Rh     Status: None   Collection Time: 10/16/20  9:52 AM  Result Value Ref Range   ABO/RH(D) O POS    No rh immune globuloin      NOT A RH IMMUNE GLOBULIN CANDIDATE, PT RH POSITIVE Performed at [facility], [address], [place], [place] [address]    US OB LESS THAN 14 WEEKS WITH OB TRANSVAGINAL  Result Date: 10/16/2020 CLINICAL DATA:  First trimester of pregnancy, vaginal bleeding. EXAM: OBSTETRIC <14 WK [place] AND TRANSVAGINAL OB US TECHNIQUE: Both transabdominal and transvaginal ultrasound examinations were performed for complete evaluation of the gestation as well as the maternal uterus, adnexal regions, and pelvic cul-de-sac. Transvaginal technique was performed to assess early pregnancy. COMPARISON:  None. FINDINGS: Intrauterine gestational sac: Single Yolk sac:  Visualized. Embryo:  Visualized. Cardiac Activity: Visualized. Heart Rate: 173 bpm CRL:  18.6 mm   8 w   2 d                  [place] EDC: May 26, 2021. Subchorionic hemorrhage:  Small subchorionic hemorrhage is noted. Maternal uterus/adnexae: Ovaries unremarkable. No free fluid is noted. IMPRESSION: Single live intrauterine gestation of 8 weeks 2 days. Small subchorionic hemorrhage is noted. Electronically Signed   By: Lupita Raider M.D.   On: 10/16/2020 10:42    MDM +UPT UA, wet prep, GC/chlamydia, CBC, ABO/Rh, quant hCG, and [place]  today to rule out ectopic pregnancy which can be life threatening.   RH positive  No active bleeding on exam & cervix closed  Ultrasound shows live IUP measuring [redacted]w[redacted]d (EDD updated) and small subchorionic hemorrhage  Assessment and Plan   1. Normal IUP (intrauterine pregnancy) on prenatal ultrasound, first trimester  -Keep f/u with [facility] ob/gyn  2. Vaginal bleeding in pregnancy, first trimester  -RH positive -discussed bleeding precautions  3. Subchorionic hematoma in first trimester, single or unspecified fetus   4. [redacted] weeks gestation of pregnancy      Judeth Horn 10/16/2020, 11:24 AM

## 2020-10-16 NOTE — MAU Note (Signed)
Patient reports to MAU, LMP 08/10/20 per patient, with c/o vaginal bleeding today. Patient reports it happened when she wiped and it was a small amount of bright red blood. She reports having "old blood" spotting a few days ago. Patient reports she has a "stomachache", denies having any cramping.

## 2020-10-19 LAB — GC/CHLAMYDIA PROBE AMP (~~LOC~~) NOT AT ARMC
Chlamydia: NEGATIVE
Comment: NEGATIVE
Comment: NORMAL
Neisseria Gonorrhea: NEGATIVE

## 2020-10-20 MED FILL — LEVOTHYROXINE SODIUM 50 MCG: 50 | 30 days supply | Qty: 30 | Fill #1

## 2020-11-16 ENCOUNTER — Other Ambulatory Visit (HOSPITAL_COMMUNITY): Payer: Self-pay | Admitting: Endocrinology

## 2020-11-16 MED FILL — LEVOTHYROXINE SODIUM 50 MCG: 50 | 30 days supply | Qty: 30 | Fill #0

## 2020-12-14 ENCOUNTER — Other Ambulatory Visit (HOSPITAL_COMMUNITY): Payer: Self-pay | Admitting: Endocrinology

## 2020-12-14 MED FILL — LEVOTHYROXINE SODIUM 50 MCG: 50 | 30 days supply | Qty: 30 | Fill #0

## 2020-12-15 ENCOUNTER — Encounter: Payer: Self-pay | Admitting: Physician Assistant

## 2020-12-15 ENCOUNTER — Ambulatory Visit (INDEPENDENT_AMBULATORY_CARE_PROVIDER_SITE_OTHER): Payer: No Typology Code available for payment source | Admitting: Physician Assistant

## 2020-12-15 ENCOUNTER — Other Ambulatory Visit: Payer: Self-pay

## 2020-12-15 VITALS — BP 127/83 | HR 85 | Temp 98.4°F | Ht 71.0 in | Wt 279.5 lb

## 2020-12-15 DIAGNOSIS — H6982 Other specified disorders of Eustachian tube, left ear: Secondary | ICD-10-CM | POA: Diagnosis not present

## 2020-12-15 NOTE — Progress Notes (Signed)
Acute Office Visit  Subjective:    Patient ID: Ginger Organ, female    DOB: Sep 17, 1991, 29 y.o.   MRN: [id number]  Chief Complaint  Patient presents with  . Otalgia    Otalgia  There is pain in the left ear. This is a new problem. The current episode started 1 to 4 weeks ago. The problem occurs every few hours. The problem has been unchanged. There has been no fever. The patient is experiencing no pain (feels fluid moving around). Associated symptoms include rhinorrhea. Pertinent negatives include no ear discharge, hearing loss or sore throat. Associated symptoms comments: She had a URI approx 2 weeks ago . She has tried acetaminophen for the symptoms. The treatment provided moderate relief. There is no history of hearing loss or a tympanostomy tube. hx of ruptured left TM age 78 years    Past Medical History:  Diagnosis Date  . Allergy   . Hypothyroidism   . Insulin resistance   . Spondylisthesis     Past Surgical History:  Procedure Laterality Date  . ANKLE SURGERY Left 2010  . MANDIBLE SURGERY  11/15/12  . TONSILLECTOMY AND ADENOIDECTOMY  1/12  . TYMPANOSTOMY TUBE PLACEMENT    . WISDOM TOOTH EXTRACTION  12/11    Family History  Problem Relation Age of Onset  . Cancer Mother        basal cell  . Kidney disease Mother        kidney stones  . Alcohol abuse Father   . Heart attack Father   . Gout Father   . Alcohol abuse Brother   . Depression Brother   . Heart disease Maternal Grandfather   . Diabetes Maternal Grandfather   . Heart attack Maternal Grandfather   . Hypertension Maternal Grandfather   . Heart disease Paternal Grandfather   . Diabetes Paternal Grandfather   . Cancer Paternal Grandfather        lymphoma  . Hypothyroidism Maternal Grandmother   . Alzheimer's disease Paternal Grandmother   . Parkinson's disease Paternal Grandmother   . Scoliosis Paternal Grandmother     Social History   Socioeconomic History  . Marital status: Married     Spouse name: Not on file  . Number of children: 0  . Years of education: Not on file  . Highest education level: Not on file  Occupational History  . Not on file  Tobacco Use  . Smoking status: Never Smoker  . Smokeless tobacco: Never Used  Vaping Use  . Vaping Use: Never used  Substance and Sexual Activity  . Alcohol use: No    Alcohol/week: 0.0 standard drinks    Comment: Less than 1   . Drug use: No  . Sexual activity: Yes    Partners: Male    Birth control/protection: None    Comment: NuvaRing  Other Topics Concern  . Not on file  Social History Narrative  . Not on file   Social Determinants of Health   Financial Resource Strain: Not on file  Food Insecurity: Not on file  Transportation Needs: Not on file  Physical Activity: Not on file  Stress: Not on file  Social Connections: Not on file  Intimate Partner Violence: Not on file    Outpatient Medications Prior to Visit  Medication Sig Dispense Refill  . acetaminophen (TYLENOL) 325 MG tablet Take 650 mg by mouth every 6 (six) hours as needed.    . docusate sodium (COLACE) 100 MG capsule Take 100 mg by  mouth 3 (three) times daily.    Marland Kitchen levothyroxine (SYNTHROID) 50 MCG tablet Take 50 mcg by mouth every morning.    . loratadine (CLARITIN) 10 MG tablet Take 10 mg by mouth daily.    . Magnesium 500 MG TABS Take 1 tablet by mouth daily.    . Prenatal Vit-Fe Fumarate-FA (PRENATAL ONE DAILY PO) Take 1 tablet by mouth daily.    Marland Kitchen levothyroxine (SYNTHROID) 25 MCG tablet Take 25 mcg by mouth daily.    . metFORMIN (GLUCOPHAGE-XR) 500 MG 24 hr tablet Take 500 mg by mouth 2 (two) times daily.    . Multiple Vitamins-Minerals (MULTIVITAMIN GUMMIES ADULT PO) Take 1 each by mouth daily.     No facility-administered medications prior to visit.    Allergies  Allergen Reactions  . Diclofenac Other (See Comments)    Tachypnea, dizziness/lightheadedness   . Adhesive [Tape] Rash  . Sulfa Antibiotics Rash    Review of Systems   Constitutional: Negative for fatigue and fever.  HENT: Positive for ear pain (left), postnasal drip, rhinorrhea and tinnitus (chronic, intermittent). Negative for congestion, ear discharge, facial swelling, hearing loss, sinus pressure, sinus pain, sore throat and trouble swallowing.   Neurological: Positive for dizziness (self-resolved after 1 hr 3 days ago).  All other systems reviewed and are negative.       Objective:    Physical Exam Vitals reviewed.  Constitutional:      Appearance: She is not ill-appearing or toxic-appearing.  HENT:     Right Ear: Tympanic membrane, ear canal and external ear normal.     Left Ear: Tympanic membrane, ear canal and external ear normal. No decreased hearing (intact to finger rub) noted. No tenderness. No mastoid tenderness. Tympanic membrane is not injected, perforated or erythematous.     Nose: Nose normal.     Left Turbinates: Enlarged.     Right Sinus: No maxillary sinus tenderness or frontal sinus tenderness.     Left Sinus: No maxillary sinus tenderness or frontal sinus tenderness.     Mouth/Throat:     Lips: Pink.     Mouth: Mucous membranes are moist.     Tongue: No lesions.     Palate: No lesions.     Pharynx: Oropharynx is clear. Uvula midline.  Cardiovascular:     Rate and Rhythm: Normal rate and regular rhythm.     Heart sounds: No murmur heard.   Pulmonary:     Effort: Pulmonary effort is normal.     Breath sounds: Normal breath sounds.  Musculoskeletal:     Cervical back: Neck supple.  Lymphadenopathy:     Head:     Right side of head: No submandibular, tonsillar, preauricular or posterior auricular adenopathy.     Left side of head: No submandibular, tonsillar, preauricular or posterior auricular adenopathy.     Cervical: No cervical adenopathy.  Neurological:     Mental Status: She is alert.     BP 127/83   Pulse 85   Temp 98.4 F (36.9 C) (Oral)   Ht 5\' 11"  (1.803 m)   Wt 279 lb 8 oz (126.8 kg)   LMP  08/10/2020   SpO2 98% Comment: on RA  BMI 38.98 kg/m  Wt Readings from Last 3 Encounters:  12/15/20 279 lb 8 oz (126.8 kg)  10/16/20 275 lb 4.8 oz (124.9 kg)  04/15/20 279 lb 3.2 oz (126.6 kg)    Assessment & Plan:   Problem List Items Addressed This Visit  Nervous and Auditory   Eustachian tube dysfunction, left - Primary     No evidence of AOM Counseled patient on self-limited nature and that this can take weeks to resolve and possibly longer due to pregnancy Recommend self-insufflation techniques such as chewing gum, yawning, swallowing with tongue on palate Can try OTC nasal saline rinses Avoid decongestants and nasal steroids due to pregnancy Counseled on return precautions Follow-up prn  No orders of the defined types were placed in this encounter.    Carlis Stable, [place]

## 2020-12-15 NOTE — Patient Instructions (Signed)
Eustachian Tube Dysfunction ° °Eustachian tube dysfunction refers to a condition in which a blockage develops in the narrow passage that connects the middle ear to the back of the nose (eustachian tube). The eustachian tube regulates air pressure in the middle ear by letting air move between the ear and nose. It also helps to drain fluid from the middle ear space. °Eustachian tube dysfunction can affect one or both ears. When the eustachian tube does not function properly, air pressure, fluid, or both can build up in the middle ear. °What are the causes? °This condition occurs when the eustachian tube becomes blocked or cannot open normally. Common causes of this condition include: °· Ear infections. °· Colds and other infections that affect the nose, mouth, and throat (upper respiratory tract). °· Allergies. °· Irritation from cigarette smoke. °· Irritation from stomach acid coming up into the esophagus (gastroesophageal reflux). The esophagus is the tube that carries food from the mouth to the stomach. °· Sudden changes in air pressure, such as from descending in an airplane or scuba diving. °· Abnormal growths in the nose or throat, such as: °? Growths that line the nose (nasal polyps). °? Abnormal growth of cells (tumors). °? Enlarged tissue at the back of the throat (adenoids). °What increases the risk? °You are more likely to develop this condition if: °· You smoke. °· You are overweight. °· You are a child who has: °? Certain birth defects of the mouth, such as cleft palate. °? Large tonsils or adenoids. °What are the signs or symptoms? °Common symptoms of this condition include: °· A feeling of fullness in the ear. °· Ear pain. °· Clicking or popping noises in the ear. °· Ringing in the ear. °· Hearing loss. °· Loss of balance. °· Dizziness. °Symptoms may get worse when the air pressure around you changes, such as when you travel to an area of high elevation, fly on an airplane, or go scuba diving. °How is  this diagnosed? °This condition may be diagnosed based on: °· Your symptoms. °· A physical exam of your ears, nose, and throat. °· Tests, such as those that measure: °? The movement of your eardrum (tympanogram). °? Your hearing (audiometry). °How is this treated? °Treatment depends on the cause and severity of your condition. °· In mild cases, you may relieve your symptoms by moving air into your ears. This is called "popping the ears." °· In more severe cases, or if you have symptoms of fluid in your ears, treatment may include: °? Medicines to relieve congestion (decongestants). °? Medicines that treat allergies (antihistamines). °? Nasal sprays or ear drops that contain medicines that reduce swelling (steroids). °? A procedure to drain the fluid in your eardrum (myringotomy). In this procedure, a small tube is placed in the eardrum to: °§ Drain the fluid. °§ Restore the air in the middle ear space. °? A procedure to insert a balloon device through the nose to inflate the opening of the eustachian tube (balloon dilation). °Follow these instructions at home: °Lifestyle °· Do not do any of the following until your health care provider approves: °? Travel to high altitudes. °? Fly in airplanes. °? Work in a pressurized cabin or room. °? Scuba dive. °· Do not use any products that contain nicotine or tobacco, such as cigarettes and e-cigarettes. If you need help quitting, ask your health care provider. °· Keep your ears dry. Wear fitted earplugs during showering and bathing. Dry your ears completely after. °General instructions °· Take over-the-counter   and prescription medicines only as told by your health care provider. °· Use techniques to help pop your ears as recommended by your health care provider. These may include: °? Chewing gum. °? Yawning. °? Frequent, forceful swallowing. °? Closing your mouth, holding your nose closed, and gently blowing as if you are trying to blow air out of your nose. °· Keep all  follow-up visits as told by your health care provider. This is important. °Contact a health care provider if: °· Your symptoms do not go away after treatment. °· Your symptoms come back after treatment. °· You are unable to pop your ears. °· You have: °? A fever. °? Pain in your ear. °? Pain in your head or neck. °? Fluid draining from your ear. °· Your hearing suddenly changes. °· You become very dizzy. °· You lose your balance. °Summary °· Eustachian tube dysfunction refers to a condition in which a blockage develops in the eustachian tube. °· It can be caused by ear infections, allergies, inhaled irritants, or abnormal growths in the nose or throat. °· Symptoms include ear pain, hearing loss, or ringing in the ears. °· Mild cases are treated with maneuvers to unblock the ears, such as yawning or ear popping. °· Severe cases are treated with medicines. Surgery may also be done (rare). °This information is not intended to replace advice given to you by your health care provider. Make sure you discuss any questions you have with your health care provider. °Document Revised: 04/03/2018 Document Reviewed: 04/03/2018 °Elsevier Patient Education © 2020 [employer] ° °

## 2020-12-26 NOTE — L&D Delivery Note (Signed)
Delivery Note As per CNM note, progressed rapidly to complete and was pushing involuntarily.  I arrived about 30 seconds after delivery.  At 12:02 PM a viable female was delivered via Vaginal, Spontaneous (Presentation: Right Occiput Anterior).  APGAR: 9, 9; weight pending.   Placenta status: Spontaneous, Intact.  Cord: 3 vessels with the following complications: None.   Anesthesia: None Episiotomy: None Lacerations: 2nd degree;Periurethral;Perineal Suture Repair: 3.0 vicryl rapide Est. Blood Loss (mL):  300  Mom to postpartum.  Baby to Couplet care / Skin to Skin.  They would like him circumcised, questions answered  Zenaida Niece 05/15/2021, 12:40 PM

## 2020-12-31 ENCOUNTER — Other Ambulatory Visit (HOSPITAL_COMMUNITY): Payer: Self-pay | Admitting: Obstetrics and Gynecology

## 2020-12-31 ENCOUNTER — Encounter: Payer: Self-pay | Admitting: Physician Assistant

## 2020-12-31 ENCOUNTER — Ambulatory Visit
Admission: EM | Admit: 2020-12-31 | Discharge: 2020-12-31 | Disposition: A | Discharge status: Home / Self Care | Payer: No Typology Code available for payment source | Attending: Internal Medicine | Admitting: Internal Medicine

## 2020-12-31 ENCOUNTER — Other Ambulatory Visit: Payer: Self-pay

## 2020-12-31 DIAGNOSIS — J3089 Other allergic rhinitis: Secondary | ICD-10-CM | POA: Diagnosis not present

## 2020-12-31 MED ORDER — IPRATROPIUM BROMIDE 0.03 % NA SOLN: 2.0000 | Freq: Two times a day (BID) | NASAL | 12 refills | Status: DC

## 2020-12-31 MED FILL — AMOX-CLAV 875-125 MG TABLET: 875-125 | 7 days supply | Qty: 14 | Fill #0

## 2020-12-31 NOTE — ED Triage Notes (Addendum)
Patient presents to Urgent Care with complaints of nasal congestion, cough and left rib cage pain. She heard a "pop" from her ribs and have been in excruciating pain since. She was recently seen by PCP 2 weeks ago for ear pain. Patient reports being sick since thanksgiving and feels like she is getting worse. Covid test negative last week by [facility]. Pt is not vaccinated for covid but is vaccinated for Flu. Of ntote pt is [redacted] weeks pregnant. Dr. Senaida Ores at [facility] following pt.

## 2020-12-31 NOTE — Discharge Instructions (Signed)
Please use medications as prescribed Tylenol as needed for pain If symptoms worsen please return to your OB/GYN to be evaluated Use Mucinex with dextromethorphan for cough.

## 2020-12-31 NOTE — ED Provider Notes (Signed)
[facility]    CSN: [id number] Arrival date & time: 12/31/20  0850      History   Chief Complaint Chief Complaint  Patient presents with  . Nasal Congestion  . Cough  . Rib Injury    "heard pop yesterday"    HPI Madison Cooper is a 30 y.o. female currently [redacted] weeks pregnant comes to the urgent care with persistent nasal congestion, cough and left rib pain.  Patient has had the symptoms for several weeks.  She has tried over-the-counter medications with partial relief.  No shortness of breath or wheezing.  Cough is intermittently productive of yellowish sputum.  No green or as colored sputum.  No fever or chills.  She has been trying Mucinex with DM for cough.  She has  not tried any nasal spray.  HPI  Past Medical History:  Diagnosis Date  . Allergy   . Hypothyroidism   . Insulin resistance   . Spondylisthesis     Patient Active Problem List   Diagnosis Date Noted  . Eustachian tube dysfunction, left 12/15/2020  . Tiredness 04/15/2020  . Sleep difficulties 04/15/2020  . Motion sickness 07/23/2019  . Anxiety and depression 06/24/2019  . Contact dermatitis 02/05/2019  . Acute hip pain, left 02/05/2019  . Healthcare maintenance 01/22/2019  . Spondylolisthesis 01/22/2019  . BMI 38.0-38.9,adult 01/22/2019    Past Surgical History:  Procedure Laterality Date  . ANKLE SURGERY Left 2010  . MANDIBLE SURGERY  11/15/12  . TONSILLECTOMY AND ADENOIDECTOMY  1/12  . TYMPANOSTOMY TUBE PLACEMENT    . WISDOM TOOTH EXTRACTION  12/11    OB History    Gravida  1   Para  0   Term  0   Preterm  0   AB  0   Living  0     SAB  0   IAB  0   Ectopic  0   Multiple  0   Live Births  0            Home Medications    Prior to Admission medications   Medication Sig Start Date End Date Taking? Authorizing Provider  ipratropium (ATROVENT) 0.03 % nasal spray Place 2 sprays into both nostrils every 12 (twelve) hours. 12/31/20  Yes Porshea Janowski, Britta Mccreedy, MD   acetaminophen (TYLENOL) 325 MG tablet Take 650 mg by mouth every 6 (six) hours as needed.    [provider]  docusate sodium (COLACE) 100 MG capsule Take 100 mg by mouth 3 (three) times daily.    [provider]  levothyroxine (SYNTHROID) 50 MCG tablet Take 50 mcg by mouth every morning. 12/14/20   [provider]  loratadine (CLARITIN) 10 MG tablet Take 10 mg by mouth daily.    [provider]  Magnesium 500 MG TABS Take 1 tablet by mouth daily.    [provider]  Prenatal Vit-Fe Fumarate-FA (PRENATAL ONE DAILY PO) Take 1 tablet by mouth daily.    [provider]  escitalopram (LEXAPRO) 10 MG tablet Take 1 tablet (10 mg total) by mouth daily. 07/23/19 02/28/20  William Hamburger D, NP  etonogestrel-ethinyl estradiol (NUVARING) 0.12-0.015 MG/24HR vaginal ring Use for 3 weeks, then remove for 1 week. 12/28/16 02/28/20  Ria Comment, FNP    Family History Family History  Problem Relation Age of Onset  . Cancer Mother        basal cell  . Kidney disease Mother        kidney stones  .  Alcohol abuse Father   . Heart attack Father   . Gout Father   . Alcohol abuse Brother   . Depression Brother   . Heart disease Maternal Grandfather   . Diabetes Maternal Grandfather   . Heart attack Maternal Grandfather   . Hypertension Maternal Grandfather   . Heart disease Paternal Grandfather   . Diabetes Paternal Grandfather   . Cancer Paternal Grandfather        lymphoma  . Hypothyroidism Maternal Grandmother   . Alzheimer's disease Paternal Grandmother   . Parkinson's disease Paternal Grandmother   . Scoliosis Paternal Grandmother     Social History Social History   Tobacco Use  . Smoking status: Never Smoker  . Smokeless tobacco: Never Used  Vaping Use  . Vaping Use: Never used  Substance Use Topics  . Alcohol use: No    Alcohol/week: 0.0 standard drinks    Comment: Less than 1   . Drug use: No     Allergies   Diclofenac, Adhesive  [tape], and Sulfa antibiotics   Review of Systems Review of Systems  HENT: Positive for congestion and postnasal drip. Negative for mouth sores, sinus pressure, sinus pain and sore throat.   Respiratory: Positive for cough. Negative for shortness of breath.   Cardiovascular: Positive for chest pain.  Neurological: Negative for dizziness, light-headedness and headaches.     Physical Exam Triage Vital Signs ED Triage Vitals  Enc Vitals Group     BP 12/31/20 0943 139/88     Pulse Rate 12/31/20 0943 (!) 117     Resp 12/31/20 0943 (!) 21     Temp 12/31/20 0943 99.3 F (37.4 C)     Temp Source 12/31/20 0943 Oral     SpO2 12/31/20 0943 98 %     Weight 12/31/20 0944 279 lb (126.6 kg)     Height 12/31/20 0944 5\' 11"  (1.803 m)     Head Circumference --      Peak Flow --      Pain Score 12/31/20 0944 7     Pain Loc --      Pain Edu? --      Excl. in [place]? --    No data found.  Updated Vital Signs BP 139/88 (BP Location: Left Arm)   Pulse (!) 117   Temp 99.3 F (37.4 C) (Oral)   Resp (!) 21   Ht 5\' 11"  (1.803 m)   Wt 126.6 kg   LMP 08/10/2020   SpO2 98%   BMI 38.91 kg/m   Visual Acuity Right Eye Distance:   Left Eye Distance:   Bilateral Distance:    Right Eye Near:   Left Eye Near:    Bilateral Near:     Physical Exam Vitals and nursing note reviewed.  Constitutional:      General: She is not in acute distress.    Appearance: She is not ill-appearing.  Cardiovascular:     Rate and Rhythm: Normal rate and regular rhythm.     Pulses: Normal pulses.     Heart sounds: Normal heart sounds.  Pulmonary:     Effort: Pulmonary effort is normal. No respiratory distress.     Breath sounds: Normal breath sounds. No wheezing or rhonchi.     Comments: Tenderness on palpation over the lateral aspect of the left chest Neurological:     Mental Status: She is alert.      UC Treatments / Results  Labs (all labs ordered are listed, but only  abnormal results are  displayed) Labs Reviewed - No data to display  EKG   Radiology No results found.  Procedures Procedures (including critical care time)  Medications Ordered in UC Medications - No data to display  Initial Impression / Assessment and Plan / UC Course  I have reviewed the triage vital signs and the nursing notes.  Pertinent labs & imaging results that were available during my care of the patient were reviewed by me and considered in my medical decision making (see chart for details).     1.  Nonseasonal allergic rhinitis with cough: Ipratropium nasal spray Continue using Mucinex DM for cough and Tylenol as needed for pain Follow-up with OB/GYN for further recommendations If symptoms worsen please call your OB/GYN sooner. Final Clinical Impressions(s) / UC Diagnoses   Final diagnoses:  Non-seasonal allergic rhinitis due to other allergic trigger     Discharge Instructions     Please use medications as prescribed Tylenol as needed for pain If symptoms worsen please return to your OB/GYN to be evaluated Use Mucinex with dextromethorphan for cough.   ED Prescriptions    Medication Sig Dispense Auth. Provider   ipratropium (ATROVENT) 0.03 % nasal spray Place 2 sprays into both nostrils every 12 (twelve) hours. 30 mL Kursten Kruk, Britta Mccreedy, MD     PDMP not reviewed this encounter.   Merrilee Jansky, MD 12/31/20 1106

## 2020-12-31 NOTE — Telephone Encounter (Signed)
Called patient who states her OBGYN has sent her in an antibiotic. Patient is going to start med and call if symptoms worsen or she doesn't improve. AS, CMA

## 2021-01-14 MED FILL — LEVOTHYROXINE SODIUM 50 MCG: 50 | 30 days supply | Qty: 30 | Fill #1

## 2021-01-15 ENCOUNTER — Other Ambulatory Visit: Payer: Self-pay | Admitting: Certified Nurse Midwife

## 2021-01-15 ENCOUNTER — Inpatient Hospital Stay (HOSPITAL_COMMUNITY): Payer: No Typology Code available for payment source

## 2021-01-15 ENCOUNTER — Inpatient Hospital Stay (HOSPITAL_COMMUNITY)
Admission: AD | Admit: 2021-01-15 | Discharge: 2021-01-15 | Disposition: A | Discharge status: Home / Self Care | Payer: No Typology Code available for payment source | Attending: Obstetrics and Gynecology | Admitting: Obstetrics and Gynecology

## 2021-01-15 ENCOUNTER — Other Ambulatory Visit: Payer: Self-pay

## 2021-01-15 ENCOUNTER — Encounter (HOSPITAL_COMMUNITY): Payer: Self-pay | Admitting: Obstetrics and Gynecology

## 2021-01-15 DIAGNOSIS — Z882 Allergy status to sulfonamides status: Secondary | ICD-10-CM | POA: Insufficient documentation

## 2021-01-15 DIAGNOSIS — U071 COVID-19: Secondary | ICD-10-CM | POA: Diagnosis not present

## 2021-01-15 DIAGNOSIS — Z3A21 21 weeks gestation of pregnancy: Secondary | ICD-10-CM | POA: Insufficient documentation

## 2021-01-15 DIAGNOSIS — R109 Unspecified abdominal pain: Secondary | ICD-10-CM | POA: Diagnosis present

## 2021-01-15 DIAGNOSIS — O98512 Other viral diseases complicating pregnancy, second trimester: Secondary | ICD-10-CM | POA: Diagnosis not present

## 2021-01-15 LAB — D-DIMER, QUANTITATIVE: D-Dimer, Quant: 0.93 ug/mL-FEU — ABNORMAL HIGH (ref 0.00–0.50)

## 2021-01-15 LAB — URINALYSIS, ROUTINE W REFLEX MICROSCOPIC
Bacteria, UA: NONE SEEN
Bilirubin Urine: NEGATIVE
Glucose, UA: NEGATIVE mg/dL
Hgb urine dipstick: NEGATIVE
Ketones, ur: NEGATIVE mg/dL
Nitrite: NEGATIVE
Protein, ur: NEGATIVE mg/dL
Specific Gravity, Urine: 1.021 (ref 1.005–1.030)
pH: 5 (ref 5.0–8.0)

## 2021-01-15 LAB — BRAIN NATRIURETIC PEPTIDE: B Natriuretic Peptide: 25.1 pg/mL (ref 0.0–100.0)

## 2021-01-15 LAB — COMPREHENSIVE METABOLIC PANEL
ALT: 27 U/L (ref 0–44)
AST: 24 U/L (ref 15–41)
Albumin: 3.2 g/dL — ABNORMAL LOW (ref 3.5–5.0)
Alkaline Phosphatase: 49 U/L (ref 38–126)
Anion gap: 14 (ref 5–15)
BUN: 6 mg/dL (ref 6–20)
CO2: 18 mmol/L — ABNORMAL LOW (ref 22–32)
Calcium: 8.4 mg/dL — ABNORMAL LOW (ref 8.9–10.3)
Chloride: 103 mmol/L (ref 98–111)
Creatinine, Ser: 0.66 mg/dL (ref 0.44–1.00)
GFR, Estimated: >60 mL/min (ref >60)
Glucose, Bld: 145 mg/dL — ABNORMAL HIGH (ref 70–99)
Potassium: 3.2 mmol/L — ABNORMAL LOW (ref 3.5–5.1)
Sodium: 135 mmol/L (ref 135–145)
Total Bilirubin: 0.5 mg/dL (ref 0.3–1.2)
Total Protein: 6.3 g/dL — ABNORMAL LOW (ref 6.5–8.1)

## 2021-01-15 LAB — CBC WITH DIFFERENTIAL/PLATELET
Abs Immature Granulocytes: 0.13 10*3/uL — ABNORMAL HIGH (ref 0.00–0.07)
Basophils Absolute: 0 10*3/uL (ref 0.0–0.1)
Basophils Relative: 0 %
Eosinophils Absolute: 0 10*3/uL (ref 0.0–0.5)
Eosinophils Relative: 0 %
HCT: 40.7 % (ref 36.0–46.0)
Hemoglobin: 13.4 g/dL (ref 12.0–15.0)
Immature Granulocytes: 1 %
Lymphocytes Relative: 22 %
Lymphs Abs: 2 10*3/uL (ref 0.7–4.0)
MCH: 28.9 pg (ref 26.0–34.0)
MCHC: 32.9 g/dL (ref 30.0–36.0)
MCV: 87.9 fL (ref 80.0–100.0)
Monocytes Absolute: 0.6 10*3/uL (ref 0.1–1.0)
Monocytes Relative: 6 %
Neutro Abs: 6.3 10*3/uL (ref 1.7–7.7)
Neutrophils Relative %: 71 %
Platelets: 214 10*3/uL (ref 150–400)
RBC: 4.63 MIL/uL (ref 3.87–5.11)
RDW: 13.2 % (ref 11.5–15.5)
WBC: 9 10*3/uL (ref 4.0–10.5)
nRBC: 0 % (ref 0.0–0.2)

## 2021-01-15 LAB — TROPONIN I (HIGH SENSITIVITY): Troponin I (High Sensitivity): 2 ng/L (ref <18)

## 2021-01-15 MED ORDER — GUAIFENESIN-CODEINE 100-10 MG/5ML PO SOLN: 5.0000 mL | Freq: Three times a day (TID) | ORAL | 0 refills | Status: DC | PRN

## 2021-01-15 MED ORDER — OXYCODONE HCL 5 MG PO TABS
5.0000 mg | ORAL_TABLET | Freq: Once | ORAL | Status: AC
  Administered 2021-01-15: 5 mg via ORAL
  Filled 2021-01-15: qty 1

## 2021-01-15 MED ORDER — LACTATED RINGERS IV BOLUS
1000.0000 mL | Freq: Once | INTRAVENOUS | Status: AC
  Administered 2021-01-15: 1000 mL via INTRAVENOUS

## 2021-01-15 MED ORDER — GUAIFENESIN-CODEINE 225-7.5 MG/5ML PO LIQD: 10.0000 mL | ORAL | 0 refills | Status: DC | PRN

## 2021-01-15 MED ORDER — FLUTICASONE PROPIONATE 50 MCG/ACT NA SUSP: 2.0000 | Freq: Every day | NASAL | 2 refills | Status: DC

## 2021-01-15 MED FILL — GUAIATUSSIN AC LIQUID: 100-10 | 8 days supply | Qty: 120 | Fill #0

## 2021-01-15 MED FILL — FLUTICASONE PROP 50 MCG SPR: 50 | 30 days supply | Qty: 16 | Fill #0

## 2021-01-15 NOTE — MAU Provider Note (Signed)
Chief Complaint:  Abdominal Pain   Event Date/Time   First Provider Initiated Contact with Patient 01/15/21 1208     HPI: Madison Cooper is a 30 y.o. G1P0000 at [redacted]w[redacted]d who presents to maternity admissions reporting left sided pain. Denies vaginal bleeding, leaking of fluid, decreased fetal movement, fever, falls, or recent illness.   Patient recently diagnosed with covid and has been coughing. She had a coughing spell last evening and felt a pop. After that she started having intense pain on the left side from the breast down.   Location: left side, breast down Quality: sharp Severity: 8/10 in pain scale Duration: since 01/14/2021 pm Timing: constnat Modifying factors: tylenol, but did not helpt Associated signs and symptoms: cough, congestion  Pregnancy Course: uncomplicated   Past Medical History:  Diagnosis Date  . Allergy   . Hypothyroidism   . Insulin resistance   . Spondylisthesis    OB History  Gravida Para Term Preterm AB Living  1 0 0 0 0 0  SAB IAB Ectopic Multiple Live Births  0 0 0 0 0    # Outcome Date GA Lbr Len/2nd Weight Sex Delivery Anes PTL Lv  1 Current            Past Surgical History:  Procedure Laterality Date  . ANKLE SURGERY Left 2010  . MANDIBLE SURGERY  11/15/12  . TONSILLECTOMY AND ADENOIDECTOMY  1/12  . TYMPANOSTOMY TUBE PLACEMENT    . WISDOM TOOTH EXTRACTION  12/11   Family History  Problem Relation Age of Onset  . Cancer Mother        basal cell  . Kidney disease Mother        kidney stones  . Alcohol abuse Father   . Heart attack Father   . Gout Father   . Alcohol abuse Brother   . Depression Brother   . Heart disease Maternal Grandfather   . Diabetes Maternal Grandfather   . Heart attack Maternal Grandfather   . Hypertension Maternal Grandfather   . Heart disease Paternal Grandfather   . Diabetes Paternal Grandfather   . Cancer Paternal Grandfather        lymphoma  . Hypothyroidism Maternal Grandmother   . Alzheimer's  disease Paternal Grandmother   . Parkinson's disease Paternal Grandmother   . Scoliosis Paternal Grandmother    Social History   Tobacco Use  . Smoking status: Never Smoker  . Smokeless tobacco: Never Used  Vaping Use  . Vaping Use: Never used  Substance Use Topics  . Alcohol use: No    Alcohol/week: 0.0 standard drinks    Comment: Less than 1   . Drug use: No   Allergies  Allergen Reactions  . Diclofenac Other (See Comments)    Tachypnea, dizziness/lightheadedness   . Adhesive [Tape] Rash  . Sulfa Antibiotics Rash   Medications Prior to Admission  Medication Sig Dispense Refill Last Dose  . acetaminophen (TYLENOL) 325 MG tablet Take 650 mg by mouth every 6 (six) hours as needed.     . docusate sodium (COLACE) 100 MG capsule Take 100 mg by mouth 3 (three) times daily.     Marland Kitchen ipratropium (ATROVENT) 0.03 % nasal spray Place 2 sprays into both nostrils every 12 (twelve) hours. 30 mL 12   . levothyroxine (SYNTHROID) 50 MCG tablet Take 50 mcg by mouth every morning.     . loratadine (CLARITIN) 10 MG tablet Take 10 mg by mouth daily.     . Magnesium 500 MG  TABS Take 1 tablet by mouth daily.     . Prenatal Vit-Fe Fumarate-FA (PRENATAL ONE DAILY PO) Take 1 tablet by mouth daily.       I have reviewed patient's Past Medical Hx, Surgical Hx, Family Hx, Social Hx, medications and allergies.   ROS:  Review of Systems  Constitutional: Negative for chills and fever.  Respiratory: Positive for cough.   Cardiovascular: Positive for chest pain.  Gastrointestinal: Negative for nausea and vomiting.  Genitourinary: Negative for dysuria, pelvic pain, vaginal bleeding and vaginal discharge.    Physical Exam   Patient Vitals for the past 24 hrs:  BP Temp Temp src Pulse Resp SpO2 Height Weight  01/15/21 1214 - - - - - 97 % - -  01/15/21 1209 - - - - - 96 % - -  01/15/21 1204 - - - - - 97 % - -  01/15/21 1159 - - - - - 98 % - -  01/15/21 1148 123/68 - - (!) 125 (!) 24 - - -  01/15/21  1127 120/78 98.5 F (36.9 C) Oral (!) 113 20 99 % - -  01/15/21 1121 - - - - - - 5\' 11"  (1.803 m) 126.8 kg    Constitutional: Well-developed, well-nourished female in no acute distress.  Cardiovascular: normal rate & rhythm, no murmur Respiratory: normal effort, lung sounds clear throughout GI: Abd soft, non-tender, gravid appropriate for gestational age. Pos BS x 4 MS: Extremities nontender, no edema, normal ROM Neurologic: Alert and oriented x 4.  GU: no CVA tenderness Pelvic: NEFG, physiologic discharge, no blood, cervix clean.      Pt informed that the ultrasound is considered a limited OB ultrasound and is not intended to be a complete ultrasound exam.  Patient also informed that the ultrasound is not being completed with the intent of assessing for fetal or placental anomalies or any pelvic abnormalities.  Explained that the purpose of today's ultrasound is to assess for  viability.  Patient acknowledges the purpose of the exam and the limitations of the study.    +FCA 140bpm    Labs: Results for orders placed or performed during the hospital encounter of 01/15/21 (from the past 24 hour(s))  CBC with Differential/Platelet     Status: Abnormal   Collection Time: 01/15/21 12:57 PM  Result Value Ref Range   WBC 9.0 4.0 - 10.5 K/uL   RBC 4.63 3.87 - 5.11 MIL/uL   Hemoglobin 13.4 12.0 - 15.0 g/dL   HCT 01/17/21 85.4 - 62.7 %   MCV 87.9 80.0 - 100.0 fL   MCH 28.9 26.0 - 34.0 pg   MCHC 32.9 30.0 - 36.0 g/dL   RDW 03.5 00.9 - 38.1 %   Platelets 214 150 - 400 K/uL   nRBC 0.0 0.0 - 0.2 %   Neutrophils Relative % 71 %   Neutro Abs 6.3 1.7 - 7.7 K/uL   Lymphocytes Relative 22 %   Lymphs Abs 2.0 0.7 - 4.0 K/uL   Monocytes Relative 6 %   Monocytes Absolute 0.6 0.1 - 1.0 K/uL   Eosinophils Relative 0 %   Eosinophils Absolute 0.0 0.0 - 0.5 K/uL   Basophils Relative 0 %   Basophils Absolute 0.0 0.0 - 0.1 K/uL   Immature Granulocytes 1 %   Abs Immature Granulocytes 0.13 (H) 0.00 - 0.07  K/uL  Brain natriuretic peptide     Status: None   Collection Time: 01/15/21 12:57 PM  Result Value Ref Range   B  Natriuretic Peptide 25.1 0.0 - 100.0 pg/mL  Comprehensive metabolic panel     Status: Abnormal   Collection Time: 01/15/21 12:57 PM  Result Value Ref Range   Sodium 135 135 - 145 mmol/L   Potassium 3.2 (L) 3.5 - 5.1 mmol/L   Chloride 103 98 - 111 mmol/L   CO2 18 (L) 22 - 32 mmol/L   Glucose, Bld 145 (H) 70 - 99 mg/dL   BUN 6 6 - 20 mg/dL   Creatinine, Ser 9.600.66 0.44 - 1.00 mg/dL   Calcium 8.4 (L) 8.9 - 10.3 mg/dL   Total Protein 6.3 (L) 6.5 - 8.1 g/dL   Albumin 3.2 (L) 3.5 - 5.0 g/dL   AST 24 15 - 41 U/L   ALT 27 0 - 44 U/L   Alkaline Phosphatase 49 38 - 126 U/L   Total Bilirubin 0.5 0.3 - 1.2 mg/dL   GFR, Estimated >45>60 >40>60 mL/min   Anion gap 14 5 - 15  Troponin I (High Sensitivity)     Status: None   Collection Time: 01/15/21 12:57 PM  Result Value Ref Range   Troponin I (High Sensitivity) 2 <18 ng/L  D-dimer, quantitative (not at [facility])     Status: Abnormal   Collection Time: 01/15/21 12:57 PM  Result Value Ref Range   D-Dimer, Quant 0.93 (H) 0.00 - 0.50 ug/mL-FEU  Urinalysis, Routine w reflex microscopic Urine, Clean Catch     Status: Abnormal   Collection Time: 01/15/21  2:55 PM  Result Value Ref Range   Color, Urine YELLOW YELLOW   APPearance HAZY (A) CLEAR   Specific Gravity, Urine 1.021 1.005 - 1.030   pH 5.0 5.0 - 8.0   Glucose, UA NEGATIVE NEGATIVE mg/dL   Hgb urine dipstick NEGATIVE NEGATIVE   Bilirubin Urine NEGATIVE NEGATIVE   Ketones, ur NEGATIVE NEGATIVE mg/dL   Protein, ur NEGATIVE NEGATIVE mg/dL   Nitrite NEGATIVE NEGATIVE   Leukocytes,Ua SMALL (A) NEGATIVE   RBC / HPF 0-5 0 - 5 RBC/hpf   WBC, UA 11-20 0 - 5 WBC/hpf   Bacteria, UA NONE SEEN NONE SEEN   Squamous Epithelial / LPF 0-5 0 - 5   Mucus PRESENT    Hyaline Casts, UA PRESENT     Imaging:  DG Chest 1 View  Result Date: 01/15/2021 CLINICAL DATA:  covid chest pain EXAM: CHEST  1  VIEW COMPARISON:  None. FINDINGS: Mild hypoinflation. No pneumothorax, focal consolidation or pleural effusion. Cardiomediastinal silhouette within normal limits. No acute osseous abnormality. IMPRESSION: No focal airspace disease. Electronically Signed   By: Stana Buntinghikanele  Emekauwa M.D.   On: 01/15/2021 13:22    MAU Course: Orders Placed This Encounter  Procedures  . DG Chest 1 View  . CBC with Differential/Platelet  . Brain natriuretic peptide  . Comprehensive metabolic panel  . D-dimer, quantitative (not at [facility])  . Urinalysis, Routine w reflex microscopic Urine, Clean Catch  . ED EKG  . Discharge patient   Meds ordered this encounter  Medications  . lactated ringers bolus 1,000 mL  . oxyCODONE (Oxy IR/ROXICODONE) immediate release tablet 5 mg  . fluticasone (FLONASE) 50 MCG/ACT nasal spray    Sig: Place 2 sprays into both nostrils daily.    Dispense:  10 mL    Refill:  2    Order Specific Question:   Supervising Provider    Answer:   Reva BoresPRATT, TANYA S [[id number]]  . guaiFENesin-Codeine 225-7.5 MG/5ML LIQD    Sig: Take 10 mLs by mouth every 4 (  four) hours as needed.    Dispense:  630 mL    Refill:  0    Order Specific Question:   Supervising Provider    Answer:   Samara Snide    MDM:  Assessment: 1. COVID-19 affecting pregnancy in second trimester   2. [redacted] weeks gestation of pregnancy     Plan: Discharge home in stable condition.  Comfort measures reviewed  2nd/3rd Trimester precautions  PTL precautions  Fetal kick counts RX: flonase BID, guaifenesin-codeine q 4 hours PRN  Return to MAU as needed FU with OB as planned    Follow-up Information    Edwinna Areola, DO Follow up.   Specialty: Obstetrics and Gynecology Contact information: [address] [place] [address] [place] [address] [phone]               Allergies as of 01/15/2021      Reactions   Diclofenac Other (See Comments)   Tachypnea, dizziness/lightheadedness   Adhesive [tape] Rash    Sulfa Antibiotics Rash      Medication List    TAKE these medications   acetaminophen 325 MG tablet Commonly known as: TYLENOL Take 650 mg by mouth every 6 (six) hours as needed.   docusate sodium 100 MG capsule Commonly known as: COLACE Take 100 mg by mouth 3 (three) times daily.   fluticasone 50 MCG/ACT nasal spray Commonly known as: FLONASE Place 2 sprays into both nostrils daily.   guaiFENesin-Codeine 225-7.5 MG/5ML Liqd Take 10 mLs by mouth every 4 (four) hours as needed.   ipratropium 0.03 % nasal spray Commonly known as: ATROVENT Place 2 sprays into both nostrils every 12 (twelve) hours.   levothyroxine 50 MCG tablet Commonly known as: SYNTHROID Take 50 mcg by mouth every morning.   loratadine 10 MG tablet Commonly known as: CLARITIN Take 10 mg by mouth daily.   Magnesium 500 MG Tabs Take 1 tablet by mouth daily.   PRENATAL ONE DAILY PO Take 1 tablet by mouth daily.      Thressa Sheller DNP, CNM  01/15/21  3:29 PM

## 2021-01-15 NOTE — Progress Notes (Signed)
Corrected concentration for prescription sent to pharmacy.  Edd Arbour, CNM, MSN, IBCLC Certified Nurse Midwife, [facility]

## 2021-01-15 NOTE — Discharge Instructions (Signed)
10 Things You Can Do to Manage Your COVID-19 Symptoms at Home If you have possible or confirmed COVID-19: 1. Stay home except to get medical care. 2. Monitor your symptoms carefully. If your symptoms get worse, call your healthcare provider immediately. 3. Get rest and stay hydrated. 4. If you have a medical appointment, call the healthcare provider ahead of time and tell them that you have or may have COVID-19. 5. For medical emergencies, call 911 and notify the dispatch personnel that you have or may have COVID-19. 6. Cover your cough and sneezes with a tissue or use the inside of your elbow. 7. Wash your hands often with soap and water for at least 20 seconds or clean your hands with an alcohol-based hand sanitizer that contains at least 60% alcohol. 8. As much as possible, stay in a specific room and away from other people in your home. Also, you should use a separate bathroom, if available. If you need to be around other people in or outside of the home, wear a mask. 9. Avoid sharing personal items with other people in your household, like dishes, towels, and bedding. 10. Clean all surfaces that are touched often, like counters, tabletops, and doorknobs. Use household cleaning sprays or wipes according to the label instructions. [email or web address] 07/10/2020 This information is not intended to replace advice given to you by your health care provider. Make sure you discuss any questions you have with your health care provider. Document Revised: 10/26/2020 Document Reviewed: 10/26/2020 Elsevier Patient Education  2021 Elsevier Inc.  Safe Medications in Pregnancy   Acne: Benzoyl Peroxide Salicylic Acid  Backache/Headache: Tylenol: 2 regular strength every 4 hours OR              2 Extra strength every 6 hours  Colds/Coughs/Allergies: Benadryl (alcohol free) 25 mg every 6 hours as needed Breath right strips Claritin Cepacol throat lozenges Chloraseptic throat spray Cold-Eeze- up  to three times per day Cough drops, alcohol free Flonase (by prescription only) Guaifenesin Mucinex Robitussin DM (plain only, alcohol free) Saline nasal spray/drops Sudafed (pseudoephedrine) & Actifed ** use only after [redacted] weeks gestation and if you do not have high blood pressure Tylenol Vicks Vaporub Zinc lozenges Zyrtec   Constipation: Colace Ducolax suppositories Fleet enema Glycerin suppositories Metamucil Milk of magnesia Miralax Senokot Smooth move tea  Diarrhea: Kaopectate Imodium A-D  *NO pepto Bismol  Hemorrhoids: Anusol Anusol HC Preparation H Tucks  Indigestion: Tums Maalox Mylanta Zantac  Pepcid  Insomnia: Benadryl (alcohol free) 25mg  every 6 hours as needed Tylenol PM Unisom, no Gelcaps  Leg Cramps: Tums MagGel  Nausea/Vomiting:  Bonine Dramamine Emetrol Ginger extract Sea bands Meclizine  Nausea medication to take during pregnancy:  Unisom (doxylamine succinate 25 mg tablets) Take one tablet daily at bedtime. If symptoms are not adequately controlled, the dose can be increased to a maximum recommended dose of two tablets daily (1/2 tablet in the morning, 1/2 tablet mid-afternoon and one at bedtime). Vitamin B6 100mg  tablets. Take one tablet twice a day (up to 200 mg per day).  Skin Rashes: Aveeno products Benadryl cream or 25mg  every 6 hours as needed Calamine Lotion 1% cortisone cream  Yeast infection: Gyne-lotrimin 7 Monistat 7   **If taking multiple medications, please check labels to avoid duplicating the same active ingredients **take medication as directed on the label ** Do not exceed 4000 mg of tylenol in 24 hours **Do not take medications that contain aspirin or ibuprofen

## 2021-01-15 NOTE — MAU Note (Signed)
Presents for left sided abdominal pain around rib cage.  Reports been coughing since November, but cough has worsened since Covid Dx on 01/08/2021.  No VB. Reports +FM.

## 2021-02-13 ENCOUNTER — Encounter: Payer: Self-pay | Admitting: Physician Assistant

## 2021-02-13 DIAGNOSIS — M4310 Spondylolisthesis, site unspecified: Secondary | ICD-10-CM

## 2021-02-15 MED FILL — LEVOTHYROXINE SODIUM 50 MCG: 50 | 30 days supply | Qty: 30 | Fill #2

## 2021-03-18 MED FILL — LEVOTHYROXINE SODIUM 50 MCG: 50 | 30 days supply | Qty: 30 | Fill #3

## 2021-03-23 LAB — HIV ANTIBODY (ROUTINE TESTING W REFLEX): HIV 1&2 Ab, 4th Generation: NONREACTIVE

## 2021-03-30 ENCOUNTER — Encounter: Payer: Self-pay | Admitting: Physician Assistant

## 2021-04-08 ENCOUNTER — Ambulatory Visit: Payer: No Typology Code available for payment source | Admitting: Physical Therapy

## 2021-04-16 ENCOUNTER — Other Ambulatory Visit (HOSPITAL_COMMUNITY): Payer: Self-pay

## 2021-04-16 ENCOUNTER — Encounter: Payer: Self-pay | Admitting: Physician Assistant

## 2021-04-16 ENCOUNTER — Ambulatory Visit (INDEPENDENT_AMBULATORY_CARE_PROVIDER_SITE_OTHER): Payer: No Typology Code available for payment source | Admitting: Physician Assistant

## 2021-04-16 ENCOUNTER — Other Ambulatory Visit: Payer: Self-pay

## 2021-04-16 VITALS — BP 122/82 | HR 100 | Temp 97.4°F | Ht 71.0 in | Wt 292.1 lb

## 2021-04-16 DIAGNOSIS — Z1159 Encounter for screening for other viral diseases: Secondary | ICD-10-CM

## 2021-04-16 DIAGNOSIS — Z0000 Encounter for general adult medical examination without abnormal findings: Secondary | ICD-10-CM

## 2021-04-16 DIAGNOSIS — E063 Autoimmune thyroiditis: Secondary | ICD-10-CM | POA: Insufficient documentation

## 2021-04-16 DIAGNOSIS — E039 Hypothyroidism, unspecified: Secondary | ICD-10-CM | POA: Diagnosis not present

## 2021-04-16 DIAGNOSIS — N926 Irregular menstruation, unspecified: Secondary | ICD-10-CM | POA: Insufficient documentation

## 2021-04-16 DIAGNOSIS — Z3490 Encounter for supervision of normal pregnancy, unspecified, unspecified trimester: Secondary | ICD-10-CM | POA: Insufficient documentation

## 2021-04-16 IMAGING — DX DG CHEST 1V
1 series · 1 of 1 positions shown · non-contrast
Comparison: None.

CLINICAL DATA: covid chest pain

EXAM:
CHEST  1 VIEW

[chest]
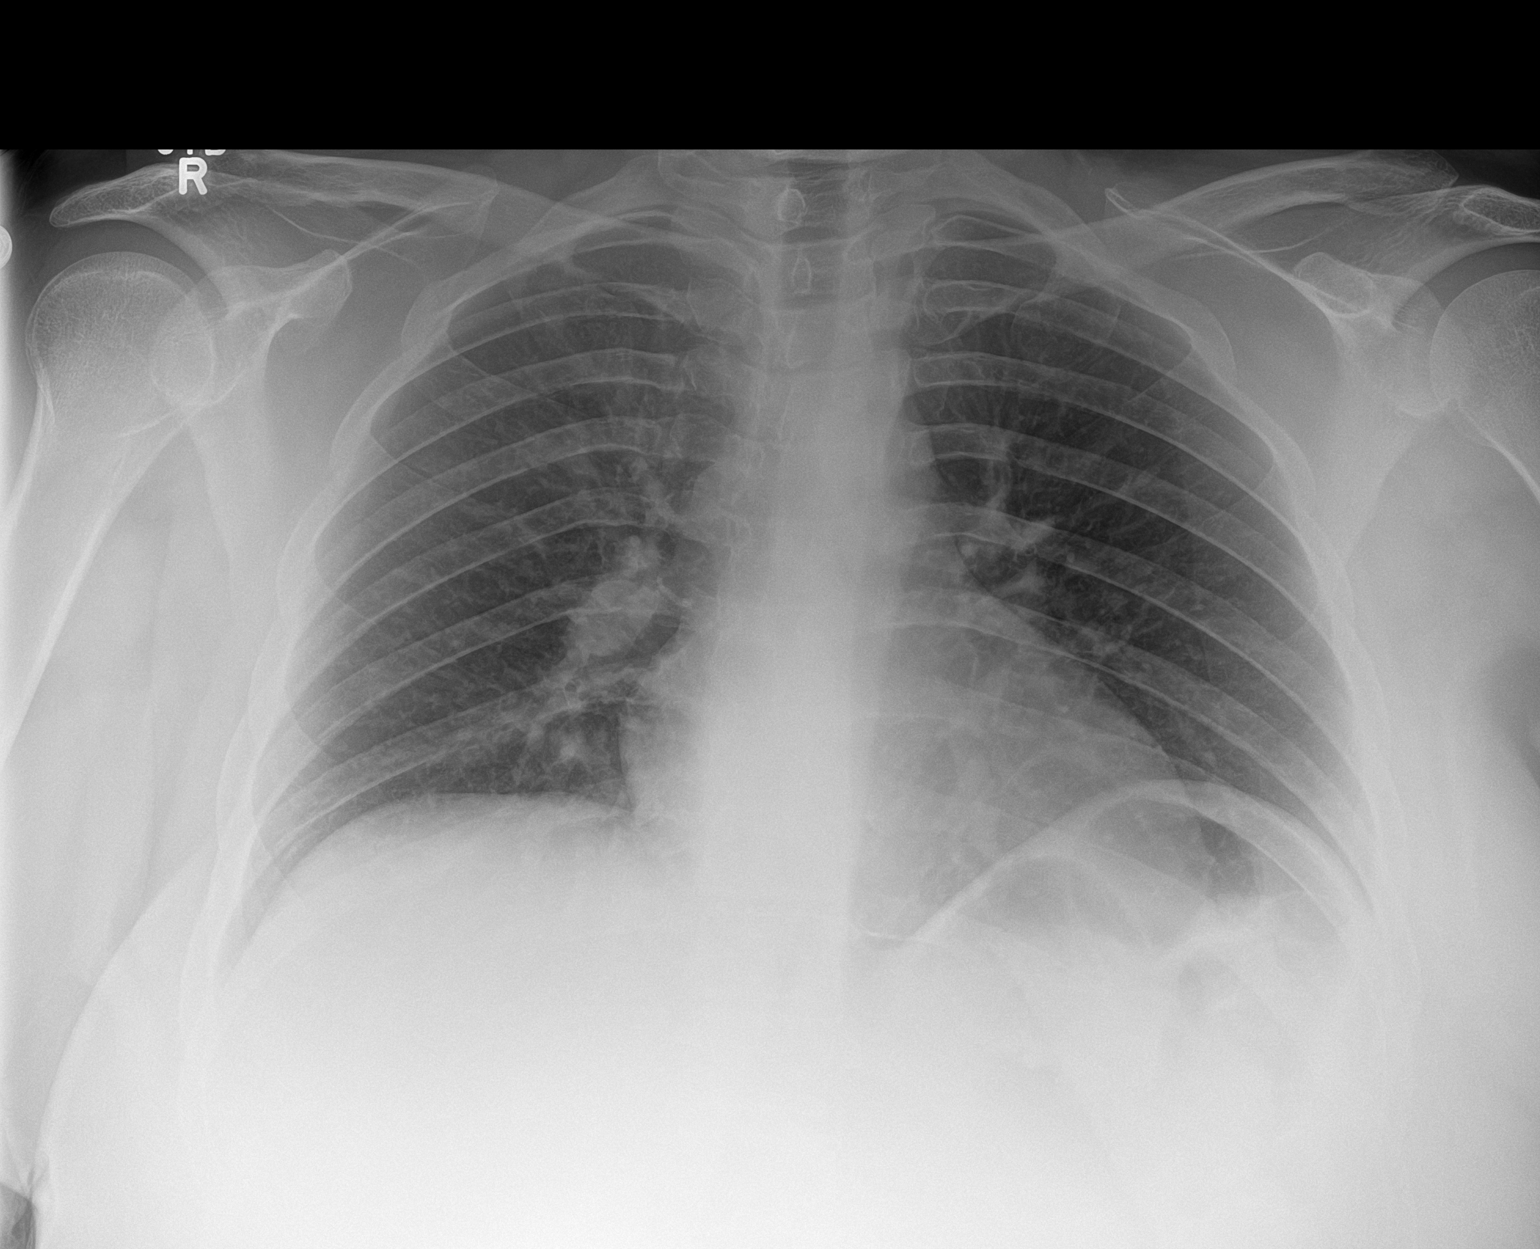

[1 of 1 positions shown; findings below may reference images not displayed]

FINDINGS: Mild hypoinflation. No pneumothorax, focal consolidation or pleural
effusion. Cardiomediastinal silhouette within normal limits. No
acute osseous abnormality.
IMPRESSION: No focal airspace disease.

## 2021-04-16 MED ORDER — LEVOTHYROXINE SODIUM 50 MCG PO TABS
ORAL_TABLET | ORAL | 3 refills | Status: DC
  Filled 2021-04-16: qty 30, 30d supply, fill #0
  Filled 2021-05-11: qty 30, 30d supply, fill #1

## 2021-04-16 NOTE — Patient Instructions (Signed)
Preventive Care 11-28 Years Old, Female Preventive care refers to lifestyle choices and visits with your health care provider that can promote health and wellness. This includes:  A yearly physical exam. This is also called an annual wellness visit.  Regular dental and eye exams.  Immunizations.  Screening for certain conditions.  Healthy lifestyle choices, such as: ? Eating a healthy diet. ? Getting regular exercise. ? Not using drugs or products that contain nicotine and tobacco. ? Limiting alcohol use. What can I expect for my preventive care visit? Physical exam Your health care provider may check your:  Height and weight. These may be used to calculate your BMI (body mass index). BMI is a measurement that tells if you are at a healthy weight.  Heart rate and blood pressure.  Body temperature.  Skin for abnormal spots. Counseling Your health care provider may ask you questions about your:  Past medical problems.  Family's medical history.  Alcohol, tobacco, and drug use.  Emotional well-being.  Home life and relationship well-being.  Sexual activity.  Diet, exercise, and sleep habits.  Work and work Statistician.  Access to firearms.  Method of birth control.  Menstrual cycle.  Pregnancy history. What immunizations do I need? Vaccines are usually given at various ages, according to a schedule. Your health care provider will recommend vaccines for you based on your age, medical history, and lifestyle or other factors, such as travel or where you work.   What tests do I need? Blood tests  Lipid and cholesterol levels. These may be checked every 5 years starting at age 64.  Hepatitis C test.  Hepatitis B test. Screening  Diabetes screening. This is done by checking your blood sugar (glucose) after you have not eaten for a while (fasting).  STD (sexually transmitted disease) testing, if you are at risk.  BRCA-related cancer screening. This may  be done if you have a family history of breast, ovarian, tubal, or peritoneal cancers.  Pelvic exam and Pap test. This may be done every 3 years starting at age 61. Starting at age 82, this may be done every 5 years if you have a Pap test in combination with an HPV test. Talk with your health care provider about your test results, treatment options, and if necessary, the need for more tests.   Follow these instructions at home: Eating and drinking  Eat a healthy diet that includes fresh fruits and vegetables, whole grains, lean protein, and low-fat dairy products.  Take vitamin and mineral supplements as recommended by your health care provider.  Do not drink alcohol if: ? Your health care provider tells you not to drink. ? You are pregnant, may be pregnant, or are planning to become pregnant.  If you drink alcohol: ? Limit how much you have to 0-1 drink a day. ? Be aware of how much alcohol is in your drink. In the [place] one drink equals one 12 oz bottle of beer (355 mL), one 5 oz glass of wine (148 mL), or one 1 oz glass of hard liquor (44 mL).   Lifestyle  Take daily care of your teeth and gums. Brush your teeth every morning and night with fluoride toothpaste. Floss one time each day.  Stay active. Exercise for at least 30 minutes 5 or more days each week.  Do not use any products that contain nicotine or tobacco, such as cigarettes, e-cigarettes, and chewing tobacco. If you need help quitting, ask your health care provider.  Do  not use drugs.  If you are sexually active, practice safe sex. Use a condom or other form of protection to prevent STIs (sexually transmitted infections).  If you do not wish to become pregnant, use a form of birth control. If you plan to become pregnant, see your health care provider for a prepregnancy visit.  Find healthy ways to cope with stress, such as: ? Meditation, yoga, or listening to music. ? Journaling. ? Talking to a trusted  person. ? Spending time with friends and family. Safety  Always wear your seat belt while driving or riding in a vehicle.  Do not drive: ? If you have been drinking alcohol. Do not ride with someone who has been drinking. ? When you are tired or distracted. ? While texting.  Wear a helmet and other protective equipment during sports activities.  If you have firearms in your house, make sure you follow all gun safety procedures.  Seek help if you have been physically or sexually abused. What's next?  Go to your health care provider once a year for an annual wellness visit.  Ask your health care provider how often you should have your eyes and teeth checked.  Stay up to date on all vaccines. This information is not intended to replace advice given to you by your health care provider. Make sure you discuss any questions you have with your health care provider. Document Revised: 08/09/2020 Document Reviewed: 08/23/2018 Elsevier Patient Education  2021 [employer].

## 2021-04-16 NOTE — Progress Notes (Signed)
  Subjective:     Madison Cooper is a 30 y.o. female and is here for a comprehensive physical exam. The patient reports no problems.  Social History   Socioeconomic History  . Marital status: Married    Spouse name: Not on file  . Number of children: 0  . Years of education: Not on file  . Highest education level: Not on file  Occupational History  . Not on file  Tobacco Use  . Smoking status: Never Smoker  . Smokeless tobacco: Never Used  Vaping Use  . Vaping Use: Never used  Substance and Sexual Activity  . Alcohol use: No    Alcohol/week: 0.0 standard drinks    Comment: Less than 1   . Drug use: No  . Sexual activity: Yes    Partners: Male    Birth control/protection: None    Comment: NuvaRing  Other Topics Concern  . Not on file  Social History Narrative  . Not on file   Social Determinants of Health   Financial Resource Strain: Not on file  Food Insecurity: Not on file  Transportation Needs: Not on file  Physical Activity: Not on file  Stress: Not on file  Social Connections: Not on file  Intimate Partner Violence: Not on file   Health Maintenance  Topic Date Due  . COVID-19 Vaccine (1) Never done  . INFLUENZA VACCINE  07/26/2021  . PAP-Cervical Cytology Screening  01/01/2022  . PAP SMEAR-Modifier  01/01/2022  . TETANUS/TDAP  02/24/2031  . Hepatitis C Screening  Completed  . HIV Screening  Completed  . HPV VACCINES  Aged Out    The following portions of the patient's history were reviewed and updated as appropriate: allergies, current medications, past family history, past medical history, past social history, past surgical history and problem list.  Review of Systems Pertinent items noted in HPI and remainder of comprehensive ROS otherwise negative.   Objective:    BP 122/82   Pulse 100   Temp (!) 97.4 F (36.3 C)   Ht 5\' 11"  (1.803 m)   Wt 292 lb 1.6 oz (132.5 kg)   LMP 08/10/2020   SpO2 98%   BMI 40.74 kg/m  General appearance: alert,  cooperative and no distress Head: Normocephalic, without obvious abnormality, atraumatic Eyes: conjunctivae/corneas clear. PERRL, EOM's intact. Fundi benign. Ears: normal TM's and external ear canals both ears Nose: Nares normal. Septum midline. Mucosa normal. No drainage or sinus tenderness. Throat: lips, mucosa, and tongue normal; teeth and gums normal Neck: no adenopathy, supple, symmetrical, trachea midline and thyroid: nontender, no nodules Back: no kyphosis present, range of motion normal Lungs: clear to auscultation bilaterally Heart: regular rate and rhythm, S1, S2 normal, no murmur, click, rub or gallop Abdomen: normal findings: bowel sounds normal, no masses palpable and pregnancy+  Extremities: extremities normal, atraumatic, no cyanosis or edema Pulses: 2+ and symmetric Skin: Skin color, texture, turgor normal. No rashes or lesions Lymph nodes: Cervical adenopathy: normal and Supraclavicular adenopathy: normal Neurologic: Grossly normal    Assessment:    Healthy female exam.     Plan:  -Continue to follow up with Endocrinology (Dr. 08/12/2020) and OB/GYN. -Will collect labs and notify of results once available.  -UTD on immunizations except Covid vaccine, pt declines at this time. -Continue good hydration and stretching exercises. -Follow a heart healthy diet. -Follow up in 1 yr for CPE and FBW  See After Visit Summary for Counseling Recommendations

## 2021-04-17 LAB — COMPREHENSIVE METABOLIC PANEL
ALT: 12 IU/L (ref 0–32)
AST: 13 IU/L (ref 0–40)
Albumin/Globulin Ratio: 1.9 (ref 1.2–2.2)
Albumin: 4.1 g/dL (ref 3.9–5.0)
Alkaline Phosphatase: 101 IU/L (ref 44–121)
BUN/Creatinine Ratio: 9 (ref 9–23)
BUN: 5 mg/dL — ABNORMAL LOW (ref 6–20)
Bilirubin Total: 0.3 mg/dL (ref 0.0–1.2)
CO2: 16 mmol/L — ABNORMAL LOW (ref 20–29)
Calcium: 9.3 mg/dL (ref 8.7–10.2)
Chloride: 102 mmol/L (ref 96–106)
Creatinine, Ser: 0.58 mg/dL (ref 0.57–1.00)
Globulin, Total: 2.2 g/dL (ref 1.5–4.5)
Glucose: 85 mg/dL (ref 65–99)
Potassium: 4.1 mmol/L (ref 3.5–5.2)
Sodium: 137 mmol/L (ref 134–144)
Total Protein: 6.3 g/dL (ref 6.0–8.5)
eGFR: 127 mL/min/{1.73_m2} (ref >59)

## 2021-04-17 LAB — CBC
Hematocrit: 42.4 % (ref 34.0–46.6)
Hemoglobin: 13.9 g/dL (ref 11.1–15.9)
MCH: 28.5 pg (ref 26.6–33.0)
MCHC: 32.8 g/dL (ref 31.5–35.7)
MCV: 87 fL (ref 79–97)
Platelets: 217 10*3/uL (ref 150–450)
RBC: 4.88 x10E6/uL (ref 3.77–5.28)
RDW: 13.3 % (ref 11.7–15.4)
WBC: 13.9 10*3/uL — ABNORMAL HIGH (ref 3.4–10.8)

## 2021-04-17 LAB — LIPID PANEL
Chol/HDL Ratio: 3.3 ratio (ref 0.0–4.4)
Cholesterol, Total: 250 mg/dL — ABNORMAL HIGH (ref 100–199)
HDL: 76 mg/dL (ref >39)
LDL Chol Calc (NIH): 144 mg/dL — ABNORMAL HIGH (ref 0–99)
Triglycerides: 171 mg/dL — ABNORMAL HIGH (ref 0–149)
VLDL Cholesterol Cal: 30 mg/dL (ref 5–40)

## 2021-04-17 LAB — HEMOGLOBIN A1C
Est. average glucose Bld gHb Est-mCnc: 114 mg/dL
Hgb A1c MFr Bld: 5.6 % (ref 4.8–5.6)

## 2021-04-17 LAB — TSH: TSH: 1.28 u[IU]/mL (ref 0.450–4.500)

## 2021-04-17 LAB — HEPATITIS C ANTIBODY: Hep C Virus Ab: <0.1 s/co ratio (ref 0.0–0.9)

## 2021-04-19 ENCOUNTER — Encounter: Payer: Self-pay | Admitting: Physician Assistant

## 2021-04-19 ENCOUNTER — Other Ambulatory Visit (HOSPITAL_COMMUNITY): Payer: Self-pay

## 2021-05-04 LAB — OB RESULTS CONSOLE GBS: GBS: NEGATIVE

## 2021-05-11 ENCOUNTER — Telehealth (HOSPITAL_COMMUNITY): Payer: Self-pay | Admitting: *Deleted

## 2021-05-11 ENCOUNTER — Encounter (HOSPITAL_COMMUNITY): Payer: Self-pay | Admitting: *Deleted

## 2021-05-11 ENCOUNTER — Other Ambulatory Visit (HOSPITAL_COMMUNITY): Payer: Self-pay

## 2021-05-11 NOTE — Telephone Encounter (Signed)
Preadmission screen  

## 2021-05-15 ENCOUNTER — Encounter (HOSPITAL_COMMUNITY): Payer: Self-pay | Admitting: Obstetrics and Gynecology

## 2021-05-15 ENCOUNTER — Other Ambulatory Visit: Payer: Self-pay

## 2021-05-15 ENCOUNTER — Inpatient Hospital Stay (HOSPITAL_COMMUNITY)
Admission: AD | Admit: 2021-05-15 | Discharge: 2021-05-17 | DRG: 807 | Disposition: A | Discharge status: Home / Self Care | Payer: No Typology Code available for payment source | Attending: Obstetrics and Gynecology | Admitting: Obstetrics and Gynecology

## 2021-05-15 DIAGNOSIS — Z20822 Contact with and (suspected) exposure to covid-19: Secondary | ICD-10-CM | POA: Diagnosis present

## 2021-05-15 DIAGNOSIS — Z3A38 38 weeks gestation of pregnancy: Secondary | ICD-10-CM | POA: Diagnosis not present

## 2021-05-15 DIAGNOSIS — O26893 Other specified pregnancy related conditions, third trimester: Secondary | ICD-10-CM | POA: Diagnosis present

## 2021-05-15 DIAGNOSIS — O322XX0 Maternal care for transverse and oblique lie, not applicable or unspecified: Principal | ICD-10-CM | POA: Diagnosis present

## 2021-05-15 DIAGNOSIS — O99214 Obesity complicating childbirth: Secondary | ICD-10-CM | POA: Diagnosis present

## 2021-05-15 DIAGNOSIS — E039 Hypothyroidism, unspecified: Secondary | ICD-10-CM | POA: Diagnosis present

## 2021-05-15 DIAGNOSIS — O99284 Endocrine, nutritional and metabolic diseases complicating childbirth: Secondary | ICD-10-CM | POA: Diagnosis present

## 2021-05-15 LAB — CBC
HCT: 41.7 % (ref 36.0–46.0)
Hemoglobin: 14 g/dL (ref 12.0–15.0)
MCH: 29.2 pg (ref 26.0–34.0)
MCHC: 33.6 g/dL (ref 30.0–36.0)
MCV: 87.1 fL (ref 80.0–100.0)
Platelets: 219 10*3/uL (ref 150–400)
RBC: 4.79 MIL/uL (ref 3.87–5.11)
RDW: 13.5 % (ref 11.5–15.5)
WBC: 16.9 10*3/uL — ABNORMAL HIGH (ref 4.0–10.5)
nRBC: 0 % (ref 0.0–0.2)

## 2021-05-15 LAB — RESP PANEL BY RT-PCR (FLU A&B, COVID) ARPGX2
Influenza A by PCR: NEGATIVE
Influenza B by PCR: NEGATIVE
SARS Coronavirus 2 by RT PCR: NEGATIVE

## 2021-05-15 LAB — RPR: RPR Ser Ql: NONREACTIVE

## 2021-05-15 LAB — TYPE AND SCREEN
ABO/RH(D): O POS
Antibody Screen: NEGATIVE

## 2021-05-15 MED ORDER — MAGNESIUM HYDROXIDE 400 MG/5ML PO SUSP: 30.0000 mL | ORAL | Status: DC | PRN

## 2021-05-15 MED ORDER — LORATADINE 10 MG PO TABS
10.0000 mg | ORAL_TABLET | Freq: Every day | ORAL | Status: DC
  Administered 2021-05-16 – 2021-05-17 (×2): 10 mg via ORAL
  Filled 2021-05-15 (×3): qty 1

## 2021-05-15 MED ORDER — IBUPROFEN 600 MG PO TABS
600.0000 mg | ORAL_TABLET | Freq: Four times a day (QID) | ORAL | Status: DC
  Administered 2021-05-15 – 2021-05-17 (×7): 600 mg via ORAL
  Filled 2021-05-15 (×8): qty 1

## 2021-05-15 MED ORDER — OXYCODONE-ACETAMINOPHEN 5-325 MG PO TABS: 2.0000 | ORAL_TABLET | ORAL | Status: DC | PRN

## 2021-05-15 MED ORDER — ONDANSETRON HCL 4 MG PO TABS: 4.0000 mg | ORAL_TABLET | ORAL | Status: DC | PRN

## 2021-05-15 MED ORDER — DIBUCAINE (PERIANAL) 1 % EX OINT: 1.0000 "application " | TOPICAL_OINTMENT | CUTANEOUS | Status: DC | PRN

## 2021-05-15 MED ORDER — LEVOTHYROXINE SODIUM 50 MCG PO TABS
50.0000 ug | ORAL_TABLET | Freq: Every morning | ORAL | Status: DC
  Administered 2021-05-16 – 2021-05-17 (×2): 50 ug via ORAL
  Filled 2021-05-15 (×2): qty 1

## 2021-05-15 MED ORDER — OXYTOCIN BOLUS FROM INFUSION: 333.0000 mL | Freq: Once | INTRAVENOUS | Status: DC

## 2021-05-15 MED ORDER — ONDANSETRON HCL 4 MG/2ML IJ SOLN: 4.0000 mg | INTRAMUSCULAR | Status: DC | PRN

## 2021-05-15 MED ORDER — ZOLPIDEM TARTRATE 5 MG PO TABS: 5.0000 mg | ORAL_TABLET | Freq: Every evening | ORAL | Status: DC | PRN

## 2021-05-15 MED ORDER — PRENATAL MULTIVITAMIN CH
1.0000 | ORAL_TABLET | Freq: Every day | ORAL | Status: DC
  Administered 2021-05-16: 1 via ORAL
  Filled 2021-05-15: qty 1

## 2021-05-15 MED ORDER — SIMETHICONE 80 MG PO CHEW
80.0000 mg | CHEWABLE_TABLET | ORAL | Status: DC | PRN
  Filled 2021-05-15: qty 1

## 2021-05-15 MED ORDER — FLEET ENEMA 7-19 GM/118ML RE ENEM: 1.0000 | ENEMA | RECTAL | Status: DC | PRN

## 2021-05-15 MED ORDER — BENZOCAINE-MENTHOL 20-0.5 % EX AERO
1.0000 "application " | INHALATION_SPRAY | CUTANEOUS | Status: DC | PRN
  Filled 2021-05-15: qty 56

## 2021-05-15 MED ORDER — OXYCODONE-ACETAMINOPHEN 5-325 MG PO TABS: 1.0000 | ORAL_TABLET | ORAL | Status: DC | PRN

## 2021-05-15 MED ORDER — OXYCODONE HCL 5 MG PO TABS: 5.0000 mg | ORAL_TABLET | ORAL | Status: DC | PRN

## 2021-05-15 MED ORDER — MEASLES, MUMPS & RUBELLA VAC IJ SOLR: 0.5000 mL | Freq: Once | INTRAMUSCULAR | Status: DC

## 2021-05-15 MED ORDER — ACETAMINOPHEN 325 MG PO TABS: 650.0000 mg | ORAL_TABLET | ORAL | Status: DC | PRN

## 2021-05-15 MED ORDER — BUTORPHANOL TARTRATE 1 MG/ML IJ SOLN
1.0000 mg | INTRAMUSCULAR | Status: DC | PRN
  Administered 2021-05-15: 1 mg via INTRAVENOUS
  Filled 2021-05-15: qty 1

## 2021-05-15 MED ORDER — METHYLERGONOVINE MALEATE 0.2 MG PO TABS: 0.2000 mg | ORAL_TABLET | ORAL | Status: DC | PRN

## 2021-05-15 MED ORDER — SOD CITRATE-CITRIC ACID 500-334 MG/5ML PO SOLN: 30.0000 mL | ORAL | Status: DC | PRN

## 2021-05-15 MED ORDER — DIPHENHYDRAMINE HCL 25 MG PO CAPS: 25.0000 mg | ORAL_CAPSULE | Freq: Four times a day (QID) | ORAL | Status: DC | PRN

## 2021-05-15 MED ORDER — METHYLERGONOVINE MALEATE 0.2 MG/ML IJ SOLN: 0.2000 mg | INTRAMUSCULAR | Status: DC | PRN

## 2021-05-15 MED ORDER — SENNOSIDES-DOCUSATE SODIUM 8.6-50 MG PO TABS
2.0000 | ORAL_TABLET | Freq: Every day | ORAL | Status: DC
  Administered 2021-05-17: 2 via ORAL
  Filled 2021-05-15: qty 2

## 2021-05-15 MED ORDER — LIDOCAINE HCL (PF) 1 % IJ SOLN
30.0000 mL | INTRAMUSCULAR | Status: DC | PRN
  Filled 2021-05-15: qty 30

## 2021-05-15 MED ORDER — ONDANSETRON HCL 4 MG/2ML IJ SOLN
4.0000 mg | Freq: Four times a day (QID) | INTRAMUSCULAR | Status: DC | PRN
  Administered 2021-05-15: 4 mg via INTRAVENOUS
  Filled 2021-05-15: qty 2

## 2021-05-15 MED ORDER — COCONUT OIL OIL
1.0000 "application " | TOPICAL_OIL | Status: DC | PRN
  Administered 2021-05-16: 1 via TOPICAL

## 2021-05-15 MED ORDER — OXYTOCIN-SODIUM CHLORIDE 30-0.9 UT/500ML-% IV SOLN
2.5000 [IU]/h | INTRAVENOUS | Status: DC
  Filled 2021-05-15: qty 500

## 2021-05-15 MED ORDER — WITCH HAZEL-GLYCERIN EX PADS
1.0000 "application " | MEDICATED_PAD | CUTANEOUS | Status: DC | PRN
  Administered 2021-05-16: 1 via TOPICAL

## 2021-05-15 MED ORDER — LACTATED RINGERS IV SOLN: 500.0000 mL | INTRAVENOUS | Status: DC | PRN

## 2021-05-15 MED ORDER — TETANUS-DIPHTH-ACELL PERTUSSIS 5-2.5-18.5 LF-MCG/0.5 IM SUSY: 0.5000 mL | PREFILLED_SYRINGE | Freq: Once | INTRAMUSCULAR | Status: DC

## 2021-05-15 MED ORDER — OXYCODONE HCL 5 MG PO TABS: 10.0000 mg | ORAL_TABLET | ORAL | Status: DC | PRN

## 2021-05-15 MED ORDER — LACTATED RINGERS IV SOLN: INTRAVENOUS | Status: DC

## 2021-05-15 NOTE — Lactation Note (Signed)
This note was copied from a baby's chart. Lactation Consultation Note  Patient Name: Madison Cooper [id number] Date: 05/15/2021 Reason for consult: Follow-up assessment;Primapara;1st time breastfeeding;Early term 37-38.6wks;Maternal endocrine disorder Age:28 hours Mom sitting in bed with swaddled baby, mom reports baby has been cluster feeding q hr, has difficulty latching to right breast d/t shape of nipple, notes discomfort to right breast. Mom would like to know if should start pumping d/t h/o Hashimoto's d/o. Reports last feeding ~6pm.  Mom agreed to wake baby for feeding, mom with oblong right nipple, latched baby to right breast football hold. LC assisted with positioning and latch, audible swallows noted, breast tissue movement. Encouraged sandwiching to keep baby latched and engaged throughout feeding, throughout feed baby becomes fussy and latches back to breast. DEBP setup, discussed frequency and cleanup. Advised cue based feedings, expect 8-12 feeds q24hrs, skin to skin with and without feeds while mom awake and alert, signs of adequate milk transfer, pump/ hand express and offer EBM back to baby, avoid pacifiers and bottles x15mo unless indicated. Mom unsure if peds office has LC support, [facility] BF brochure given and request for outpatient appt sent to [facility]. Advised to call for [facility] support if needed prior to next visit. Mom voiced understanding and with no further concerns. BGilliam, RN, IBCLC  Maternal Data Has patient been taught Hand Expression?: No Does the patient have breastfeeding experience prior to this delivery?: No  Feeding Mother's Current Feeding Choice: Breast Milk  LATCH Score Latch: Repeated attempts needed to sustain latch, nipple held in mouth throughout feeding, stimulation needed to elicit sucking reflex.  Audible Swallowing: A few with stimulation  Type of Nipple: Everted at rest and after stimulation (right nipple oblong)  Comfort (Breast/Nipple):  Filling, red/small blisters or bruises, mild/mod discomfort  Hold (Positioning): Assistance needed to correctly position infant at breast and maintain latch.  LATCH Score: 6   Lactation Tools Discussed/Used Tools: Pump Breast pump type: Double-Electric Breast Pump Pump Education: Setup, frequency, and cleaning Reason for Pumping: stimulation Pumping frequency: after feeds  Interventions Interventions: Breast feeding basics reviewed;Assisted with latch;Skin to skin;Breast massage;Adjust position;Support pillows;Position options;DEBP  Discharge WIC Program: No  Consult Status Consult Status: Follow-up Date: 05/16/21 Follow-up type: In-patient    [name] 05/15/2021, 9:35 PM

## 2021-05-15 NOTE — H&P (Signed)
Madison Cooper is a 30 y.o. female, G1P0, EGA 38+ weeks with EDC 5-30 presenting for ctx.  On eval in MAU, having regular ctx, VE 4 cm.  PNC complicated by obesity, on baby ASA.  H/o glucose intolerance, on Metformin prior to pregnancy, normal glucose testing during pregnancy.  Also hypothyroid controlled with medication  OB History    Gravida  1   Para  0   Term  0   Preterm  0   AB  0   Living  0     SAB  0   IAB  0   Ectopic  0   Multiple  0   Live Births  0          Past Medical History:  Diagnosis Date  . Allergy   . Anxiety    Phreesia 04/15/2021  . Hypothyroidism   . Insulin resistance   . Spondylisthesis   . Thyroid disease    Phreesia 04/15/2021   Past Surgical History:  Procedure Laterality Date  . ANKLE SURGERY Left 2010  . FRACTURE SURGERY N/A    Phreesia 04/15/2021  . MANDIBLE SURGERY  11/15/12  . TONSILLECTOMY AND ADENOIDECTOMY  1/12  . TYMPANOSTOMY TUBE PLACEMENT    . WISDOM TOOTH EXTRACTION  12/11   Family History: family history includes Alcohol abuse in her brother and father; Alzheimer's disease in her paternal grandmother; Cancer in her mother and paternal grandfather; Depression in her brother; Diabetes in her maternal grandfather and paternal grandfather; Gout in her father; Heart attack in her father and maternal grandfather; Heart disease in her maternal grandfather and paternal grandfather; Hypertension in her maternal grandfather; Hypothyroidism in her maternal grandmother; Kidney disease in her mother; Parkinson's disease in her paternal grandmother; Scoliosis in her paternal grandmother. Social History:  reports that she has never smoked. She has never used smokeless tobacco. She reports that she does not drink alcohol and does not use drugs.     Maternal Diabetes: No Genetic Screening: Normal Maternal Ultrasounds/Referrals: Normal Fetal Ultrasounds or other Referrals:  None Maternal Substance Abuse:  No Significant Maternal  Medications:  None Significant Maternal Lab Results:  Group B Strep negative Other Comments:  None  Review of Systems  Respiratory: Negative.   Cardiovascular: Negative.    Maternal Medical History:  Reason for admission: Contractions.   Contractions: Frequency: regular.   Perceived severity is moderate.    Fetal activity: Perceived fetal activity is normal.    Prenatal complications: no prenatal complications Prenatal Complications - Diabetes: none.   AROM clear Dilation: 6 Effacement (%): 80 Station: -1 Exam by:: Dr. Jackelyn Cooper Blood pressure 136/83, pulse 99, temperature 98.7 F (37.1 C), temperature source Oral, resp. rate 16, height 5\' 11"  (1.803 m), weight 135.6 kg, last menstrual period 08/10/2020, SpO2 97 %. Maternal Exam:  Uterine Assessment: Contraction strength is moderate.  Contraction frequency is irregular.   Abdomen: Patient reports no abdominal tenderness. Estimated fetal weight is 8 lbs.   Fetal presentation: vertex  Introitus: Normal vulva. Normal vagina.  Amniotic fluid character: clear.  Pelvis: adequate for delivery.      Fetal Exam Fetal Monitor Review: Mode: ultrasound.   Baseline rate: 120-130.  Variability: moderate (6-25 bpm).   Pattern: accelerations present and no decelerations.    Fetal State Assessment: Category I - tracings are normal.     Physical Exam Vitals reviewed.  Constitutional:      Appearance: Normal appearance.  Cardiovascular:     Rate and Rhythm: Normal rate  and regular rhythm.  Pulmonary:     Effort: Pulmonary effort is normal. No respiratory distress.  Abdominal:     Palpations: Abdomen is soft.  Genitourinary:    General: Normal vulva.  Neurological:     Mental Status: She is alert.     Prenatal labs: ABO, Rh: --/--/O POS (05/21 6314) Antibody: NEG (05/21 9702) Rubella:  immune RPR:   NR HBsAg:   neg HIV: non reactive (03/29 0000)  GBS: Negative/-- (05/10 0000)   Assessment/Plan: IUP at 38+ weeks  in labor.  AROM done for augmentation, monitor progress, anticipate SVD   Madison Cooper Madison Cooper 05/15/2021, 9:28 AM

## 2021-05-15 NOTE — Progress Notes (Signed)
Report given to Dr Jackelyn Knife. Will admit to [facility] for labor

## 2021-05-15 NOTE — Progress Notes (Signed)
CNM called to bedside to standby for delivery. Patient rapidly progressed from 9.5cm/100/-1 to 10/100/+3 and pushing voluntarily. MD en route. Patient pushed without direction for 10 minutes and delivered a viable female LOA with a compound hand. Dr. Jackelyn Knife arrived at bedside within 30 seconds of delivery. Care turned over to MD.   Rolm Bookbinder, CNM 05/15/21 12:07 PM

## 2021-05-15 NOTE — Lactation Note (Signed)
Lactation Consultation Note LC to L&D. Mother and infant bf'ing independently. Provided brief ed. Will plan f/u on MBU. Patient Name: JESSCIA IMM [id number] Date: 05/15/2021 Reason for consult: L&D Initial assessment Age:30 y.o.  Maternal Data Does the patient have breastfeeding experience prior to this delivery?: No Hashimoto's- on Synthroid    Feeding Mother's Current Feeding Choice: Breast Milk   Interventions Interventions: Skin to skin;Breast feeding basics reviewed;Education  Consult Status Consult Status: Follow-up Follow-up type: In-patient  Elder Negus, MA IBCLC 05/15/2021, 1:14 PM

## 2021-05-15 NOTE — MAU Note (Signed)
Ctxs since yesterday evening which are about apart now. Some bloody show this am. 2cm last sve. Good FM

## 2021-05-15 NOTE — Progress Notes (Signed)
To 213 via w/c

## 2021-05-16 NOTE — Progress Notes (Signed)
PPD #1 No problems Afeb, VSS Fundus firm, NT at U-1 Continue routine postpartum care 

## 2021-05-16 NOTE — Progress Notes (Signed)
CSW met with MOB to complete consult for hx of anxiety. CSW observed MOB at bedside, and FOB laying on the couch with infant on chest. MOB gave CSW verbal consent to complete consult while FOB was present. CSW explained role and reason for consult. MOB was pleasant, polite and engaged with CSW. 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 MOB reported, she has been able to manage symptoms with no medication.   CSW provided education regarding the baby blues period vs. perinatal mood disorders, discussed treatment and gave resources for mental health follow up if concerns arise. CSW recommends self- evaluation during the postpartum time period using the New Mom Checklist from Postpartum Progress and encouraged MOB to contact a medical professional if symptoms are noted at any time.   When CSW asked MOB about her emotion since delivery. MOB reported, she feels "great, and shock that she is a parent". MOB reported, FOB, family, and friends are her supports. MOB denied SI, and HI when CSW assessed for safety.   MOB reported, there are no barriers to follow up infant's care. MOB reported, she has all essentials needed to care for infant. MOB reported, infant has a car seat, crib, and bassinet. MOB denied any additional barriers.     CSW provided education on Sudden Infant Death Syndrome (SIDS).    CSW identifies no further need for intervention or barriers to discharge at this time.  Darcus Austin, MSW, LCSW-A Clinical Social Worker [phone]

## 2021-05-17 ENCOUNTER — Other Ambulatory Visit (HOSPITAL_COMMUNITY): Payer: Self-pay

## 2021-05-17 MED ORDER — IBUPROFEN 600 MG PO TABS
600.0000 mg | ORAL_TABLET | Freq: Four times a day (QID) | ORAL | 0 refills | Status: DC
  Filled 2021-05-17: qty 30, 8d supply, fill #0

## 2021-05-17 NOTE — Progress Notes (Signed)
Post Partum Day 2 Subjective: no complaints, up ad lib and tolerating PO  No HA or PIH sx  Objective: Blood pressure 133/90, pulse 99, temperature 97.8 F (36.6 C), resp. rate 18, height 5\' 11"  (1.803 m), weight 135.6 kg, last menstrual period 08/10/2020, SpO2 100 %.  Physical Exam:  General: alert and cooperative Lochia: appropriate Uterine Fundus: firm   Recent Labs    05/15/21 0605  HGB 14.0  HCT 41.7    Assessment/Plan: Discharge home  BP overall has been WNL pp so will recheck and ensure WNL prior to d/c   LOS: 2 days   05/17/21 05/17/2021, 9:30 AM

## 2021-05-17 NOTE — Lactation Note (Signed)
This note was copied from a baby's chart. Lactation Consultation Note Attempted to see mom but everyone in room sleeping.  Patient Name: Madison Cooper ITGPQ'D Date: 05/17/2021   Age:28 hours  Maternal Data    Feeding    LATCH Score                    Lactation Tools Discussed/Used    Interventions    Discharge    Consult Status      [name] 05/17/2021, 12:34 AM

## 2021-05-17 NOTE — Discharge Summary (Signed)
Postpartum Discharge Summary      Patient Name: Madison Cooper DOB: Feb 24, 1991 MRN: [id number]  Date of admission: 05/15/2021 Delivery date:05/15/2021  Delivering provider: Rolm Bookbinder  Date of discharge: 05/17/2021  Admitting diagnosis: Indication for care in labor or delivery [O75.9] Intrauterine pregnancy: [redacted]w[redacted]d     Secondary diagnosis:  Active Problems:   Indication for care in labor or delivery   SVD (spontaneous vaginal delivery)  Additional problems: none    Discharge diagnosis: Term Pregnancy Delivered                                              Post partum procedures:none Augmentation: AROM Complications: None  Hospital course: Onset of Labor With Vaginal Delivery      30 y.o. yo G1P0000 at [redacted]w[redacted]d was admitted in Active Labor on 05/15/2021. Patient had an uncomplicated labor course as follows:  Membrane Rupture Time/Date: 8:56 AM ,05/15/2021   Delivery Method:Vaginal, Spontaneous  Episiotomy: None  Lacerations:  2nd degree;Periurethral;Perineal  Patient had an uncomplicated postpartum course.  She is ambulating, tolerating a regular diet, passing flatus, and urinating well. Patient is discharged home in stable condition on 05/17/21.  Newborn Data: Birth date:05/15/2021  Birth time:12:02 PM  Gender:Female  Living status:Living  Apgars:9 ,9  Weight:3799 g   Magnesium Sulfate received: No BMZ received: No Rhophylac:No   Physical exam  Vitals:   05/16/21 0600 05/16/21 1607 05/16/21 2039 05/17/21 0559  BP: 126/78 127/80 122/85 133/90  Pulse: 99 100 97 99  Resp: 18 20 18    Temp: 98.6 F (37 C) 98 F (36.7 C) 98.1 F (36.7 C) 97.8 F (36.6 C)  TempSrc: Oral Axillary Oral   SpO2: 98%  99% 100%  Weight:      Height:       General: alert and cooperative Lochia: appropriate Uterine Fundus: firm  Labs: Lab Results  Component Value Date   WBC 16.9 (H) 05/15/2021   HGB 14.0 05/15/2021   HCT 41.7 05/15/2021   MCV 87.1 05/15/2021   PLT 219  05/15/2021   CMP Latest Ref Rng & Units 04/16/2021  Glucose 65 - 99 mg/dL 85  BUN 6 - 20 mg/dL 5(L)  Creatinine 04/18/2021 - 1.00 mg/dL 0.93  Sodium 8.18 - 299 mmol/L 137  Potassium 3.5 - 5.2 mmol/L 4.1  Chloride 96 - 106 mmol/L 102  CO2 20 - 29 mmol/L 16(L)  Calcium 8.7 - 10.2 mg/dL 9.3  Total Protein 6.0 - 8.5 g/dL 6.3  Total Bilirubin 0.0 - 1.2 mg/dL 0.3  Alkaline Phos 44 - 121 IU/L 101  AST 0 - 40 IU/L 13  ALT 0 - 32 IU/L 12   Edinburgh Score: Edinburgh Postnatal Depression Scale Screening Tool 05/15/2021  I have been able to laugh and see the funny side of things. 0  I have looked forward with enjoyment to things. 0  I have blamed myself unnecessarily when things went wrong. 0  I have been anxious or worried for no good reason. 2  I have felt scared or panicky for no good reason. 2  Things have been getting on top of me. 0  I have been so unhappy that I have had difficulty sleeping. 0  I have felt sad or miserable. 0  I have been so unhappy that I have been crying. 0  The thought of harming myself has  occurred to me. 0  Edinburgh Postnatal Depression Scale Total 4     After visit meds:  Allergies as of 05/17/2021      Reactions   Latex Hives, Itching, Rash   Other    Diclofenac Other (See Comments)   Tachypnea, dizziness/lightheadedness   Adhesive [tape] Rash   Sulfa Antibiotics Rash      Medication List    TAKE these medications   acetaminophen 325 MG tablet Commonly known as: TYLENOL Take 650 mg by mouth every 6 (six) hours as needed.   ibuprofen 600 MG tablet Commonly known as: ADVIL Take 1 tablet (600 mg total) by mouth every 6 (six) hours.   levothyroxine 50 MCG tablet Commonly known as: SYNTHROID Take 50 mcg by mouth every morning. What changed: Another medication with the same name was removed. Continue taking this medication, and follow the directions you see here.   loratadine 10 MG tablet Commonly known as: CLARITIN Take 10 mg by mouth daily.    PRENATAL ONE DAILY PO Take 1 tablet by mouth daily.        Discharge home in stable condition Infant Feeding: Breast Infant Disposition:home with mother Discharge instruction: per After Visit Summary and Postpartum booklet. Activity: Advance as tolerated. Pelvic rest for 6 weeks.  Diet: routine diet Future Appointments: Future Appointments  Date Time Provider Department Center  05/19/2021 10:00 AM [facility] None  04/11/2022  8:45 AM [facility] None  04/18/2022  8:10 AM Abonza, Kandis Cocking, PA-C [facility] None   Follow up Visit:  Follow-up Information    Huel Cote, MD. Schedule an appointment as soon as possible for a visit in 6 week(s).   Specialty: Obstetrics and Gynecology Why: postpartum Contact information: [address] [place] [address] [phone]                Please schedule this patient for a In person postpartum visit in 6 weeks with the following provider: MD.  Delivery mode:  Vaginal, Spontaneous  Anticipated Birth Control:  IUD   05/17/2021 Oliver Pila, MD

## 2021-05-17 NOTE — Lactation Note (Signed)
This note was copied from a baby's chart. Lactation Consultation Note  Patient Name: Madison Cooper Date: 05/17/2021   Age:28 hours  Mom reports that her nipples are creased when infant releases latch. Specifics of an asymmetric latch were shown via [employer]. No compression stripes or cracks were noted on nipples. Mom's nipples are intact, but do have some slight discoloration. Mom reports some soreness.  We attempted to latch infant on Mom's L breast with Mom in a side-lying position. Initially, he had a difficult time accommodating the size of that nipple. However, once he did so, only non-nutritive sucking was noted. I offered supplementing at the breast, but Mom would prefer to supplement with a bottle for now (she said she would be willing to try, though, at her outpatient appt, if needed).   Mom had most previously used size 24 flanges when pumping. I sized her and she needed a size 27 on her L breast & a size 30 on her R breast. Mom was given her [facility] Employee Pump. An extra size 30 flange was provided in case ever needed for her L breast.  I anticipate that Mom will have an excellent milk supply once her milk comes to volume. At that time, it is likely that she will be able to breastfeed successfully from her L breast. B/c of the shape and size of Mom's nipple on her R breast, it is likely that "[place]" will need to be a bit older before he is able to accommodate the nipple on her R breast.   [facility] 05/17/2021, 8:21 AM

## 2021-05-19 ENCOUNTER — Other Ambulatory Visit (HOSPITAL_COMMUNITY): Admission: RE | Admit: 2021-05-19 | Payer: No Typology Code available for payment source | Source: Ambulatory Visit

## 2021-05-21 ENCOUNTER — Inpatient Hospital Stay (HOSPITAL_COMMUNITY)
Admission: AD | Admit: 2021-05-21 | Payer: No Typology Code available for payment source | Source: Home / Self Care | Admitting: Obstetrics and Gynecology

## 2021-05-21 ENCOUNTER — Inpatient Hospital Stay (HOSPITAL_COMMUNITY): Payer: No Typology Code available for payment source

## 2021-05-26 ENCOUNTER — Other Ambulatory Visit (HOSPITAL_COMMUNITY): Payer: Self-pay

## 2021-06-02 ENCOUNTER — Other Ambulatory Visit (HOSPITAL_COMMUNITY): Payer: Self-pay

## 2021-06-02 MED ORDER — METOCLOPRAMIDE HCL 10 MG PO TABS
10.0000 mg | ORAL_TABLET | Freq: Three times a day (TID) | ORAL | 2 refills | Status: DC
  Filled 2021-06-02: qty 90, 30d supply, fill #0

## 2021-06-16 ENCOUNTER — Other Ambulatory Visit: Payer: Self-pay | Admitting: Obstetrics and Gynecology

## 2021-06-16 ENCOUNTER — Other Ambulatory Visit: Payer: Self-pay | Admitting: Physician Assistant

## 2021-06-16 ENCOUNTER — Other Ambulatory Visit: Payer: Self-pay

## 2021-06-16 ENCOUNTER — Other Ambulatory Visit (HOSPITAL_COMMUNITY): Payer: Self-pay

## 2021-06-16 MED ORDER — LEVOTHYROXINE SODIUM 50 MCG PO TABS
ORAL_TABLET | ORAL | 3 refills | Status: DC
  Filled 2021-06-16: qty 30, 30d supply, fill #0

## 2021-06-16 MED ORDER — SERTRALINE HCL 50 MG PO TABS
50.0000 mg | ORAL_TABLET | Freq: Every day | ORAL | 0 refills | Status: DC
  Filled 2021-06-16: qty 30, 30d supply, fill #0

## 2021-06-16 MED ORDER — LEVOTHYROXINE SODIUM 50 MCG PO TABS: 50.0000 ug | ORAL_TABLET | Freq: Every morning | ORAL | 3 refills | Status: DC

## 2021-06-16 MED ORDER — LEVOTHYROXINE SODIUM 50 MCG PO TABS
50.0000 ug | ORAL_TABLET | Freq: Every morning | ORAL | 1 refills | Status: DC
  Filled 2021-06-16: qty 30, 30d supply, fill #0
  Filled 2021-07-17: qty 30, 30d supply, fill #1

## 2021-06-18 ENCOUNTER — Other Ambulatory Visit: Payer: Self-pay

## 2021-06-18 DIAGNOSIS — Z130 Encounter for screening for diseases of the blood and blood-forming organs and certain disorders involving the immune mechanism: Secondary | ICD-10-CM

## 2021-06-18 DIAGNOSIS — Z13220 Encounter for screening for lipoid disorders: Secondary | ICD-10-CM

## 2021-06-21 ENCOUNTER — Other Ambulatory Visit: Payer: Self-pay

## 2021-06-21 ENCOUNTER — Other Ambulatory Visit: Payer: No Typology Code available for payment source

## 2021-06-21 DIAGNOSIS — Z13220 Encounter for screening for lipoid disorders: Secondary | ICD-10-CM

## 2021-06-21 DIAGNOSIS — Z130 Encounter for screening for diseases of the blood and blood-forming organs and certain disorders involving the immune mechanism: Secondary | ICD-10-CM

## 2021-06-22 LAB — CBC
Hematocrit: 45 % (ref 34.0–46.6)
Hemoglobin: 15 g/dL (ref 11.1–15.9)
MCH: 28.5 pg (ref 26.6–33.0)
MCHC: 33.3 g/dL (ref 31.5–35.7)
MCV: 86 fL (ref 79–97)
Platelets: 272 10*3/uL (ref 150–450)
RBC: 5.26 x10E6/uL (ref 3.77–5.28)
RDW: 12.3 % (ref 11.7–15.4)
WBC: 10.5 10*3/uL (ref 3.4–10.8)

## 2021-06-22 LAB — LIPID PANEL
Chol/HDL Ratio: 3.2 ratio (ref 0.0–4.4)
Cholesterol, Total: 202 mg/dL — ABNORMAL HIGH (ref 100–199)
HDL: 63 mg/dL (ref >39)
LDL Chol Calc (NIH): 126 mg/dL — ABNORMAL HIGH (ref 0–99)
Triglycerides: 70 mg/dL (ref 0–149)
VLDL Cholesterol Cal: 13 mg/dL (ref 5–40)

## 2021-07-09 ENCOUNTER — Other Ambulatory Visit (HOSPITAL_COMMUNITY): Payer: Self-pay

## 2021-07-09 ENCOUNTER — Encounter: Payer: Self-pay | Admitting: Physician Assistant

## 2021-07-09 DIAGNOSIS — E039 Hypothyroidism, unspecified: Secondary | ICD-10-CM

## 2021-07-12 ENCOUNTER — Other Ambulatory Visit (HOSPITAL_COMMUNITY): Payer: Self-pay

## 2021-07-12 MED ORDER — METFORMIN HCL ER 500 MG PO TB24
ORAL_TABLET | ORAL | 6 refills | Status: DC
  Filled 2021-07-12: qty 30, 30d supply, fill #0
  Filled 2021-08-05: qty 30, 30d supply, fill #1
  Filled 2021-09-11: qty 30, 30d supply, fill #2
  Filled 2021-10-30: qty 30, 30d supply, fill #3
  Filled 2021-12-16: qty 30, 30d supply, fill #4

## 2021-07-12 MED ORDER — SERTRALINE HCL 50 MG PO TABS
ORAL_TABLET | ORAL | 2 refills | Status: DC
  Filled 2021-07-12: qty 45, 30d supply, fill #0
  Filled 2021-08-05: qty 45, 30d supply, fill #1
  Filled 2021-09-11: qty 45, 30d supply, fill #2

## 2021-07-17 ENCOUNTER — Other Ambulatory Visit (HOSPITAL_COMMUNITY): Payer: Self-pay

## 2021-08-05 ENCOUNTER — Other Ambulatory Visit (HOSPITAL_COMMUNITY): Payer: Self-pay

## 2021-08-05 MED ORDER — LEVOTHYROXINE SODIUM 50 MCG PO TABS
ORAL_TABLET | ORAL | 4 refills | Status: DC
  Filled 2021-08-05: qty 90, 90d supply, fill #0

## 2021-08-10 ENCOUNTER — Other Ambulatory Visit (HOSPITAL_COMMUNITY): Payer: Self-pay

## 2021-09-01 ENCOUNTER — Other Ambulatory Visit (HOSPITAL_COMMUNITY): Payer: Self-pay

## 2021-09-01 MED ORDER — SERTRALINE HCL 50 MG PO TABS
ORAL_TABLET | ORAL | 2 refills | Status: DC
  Filled 2021-09-01 – 2021-11-05 (×2): qty 45, 30d supply, fill #0
  Filled 2021-12-16: qty 45, 30d supply, fill #1

## 2021-09-09 ENCOUNTER — Other Ambulatory Visit (HOSPITAL_COMMUNITY): Payer: Self-pay

## 2021-09-11 ENCOUNTER — Other Ambulatory Visit (HOSPITAL_COMMUNITY): Payer: Self-pay

## 2021-10-12 ENCOUNTER — Other Ambulatory Visit (HOSPITAL_COMMUNITY): Payer: Self-pay

## 2021-10-12 MED ORDER — LEVOTHYROXINE SODIUM 25 MCG PO TABS
ORAL_TABLET | ORAL | 6 refills | Status: DC
  Filled 2021-10-12: qty 30, 30d supply, fill #0
  Filled 2021-11-05: qty 30, 30d supply, fill #1
  Filled 2021-12-16: qty 30, 30d supply, fill #2

## 2021-10-30 ENCOUNTER — Other Ambulatory Visit (HOSPITAL_COMMUNITY): Payer: Self-pay

## 2021-11-05 ENCOUNTER — Other Ambulatory Visit (HOSPITAL_COMMUNITY): Payer: Self-pay

## 2021-11-23 ENCOUNTER — Other Ambulatory Visit (HOSPITAL_COMMUNITY): Payer: Self-pay

## 2021-11-23 MED ORDER — METOCLOPRAMIDE HCL 10 MG PO TABS
ORAL_TABLET | ORAL | 1 refills | Status: DC
  Filled 2021-11-23: qty 90, 30d supply, fill #0

## 2021-11-24 ENCOUNTER — Ambulatory Visit: Payer: No Typology Code available for payment source | Admitting: Nurse Practitioner

## 2021-12-07 ENCOUNTER — Other Ambulatory Visit (HOSPITAL_COMMUNITY): Payer: Self-pay | Admitting: Obstetrics and Gynecology

## 2021-12-07 ENCOUNTER — Ambulatory Visit (HOSPITAL_COMMUNITY)
Admission: RE | Admit: 2021-12-07 | Discharge: 2021-12-07 | Disposition: A | Discharge status: Home / Self Care | Payer: No Typology Code available for payment source | Source: Ambulatory Visit | Attending: Obstetrics and Gynecology | Admitting: Obstetrics and Gynecology

## 2021-12-07 ENCOUNTER — Other Ambulatory Visit: Payer: Self-pay

## 2021-12-07 DIAGNOSIS — M7989 Other specified soft tissue disorders: Secondary | ICD-10-CM

## 2021-12-07 DIAGNOSIS — M79605 Pain in left leg: Secondary | ICD-10-CM | POA: Diagnosis not present

## 2021-12-16 ENCOUNTER — Other Ambulatory Visit (HOSPITAL_COMMUNITY): Payer: Self-pay

## 2022-01-10 ENCOUNTER — Other Ambulatory Visit (HOSPITAL_COMMUNITY): Payer: Self-pay

## 2022-01-10 MED ORDER — METFORMIN HCL ER 500 MG PO TB24
ORAL_TABLET | ORAL | 6 refills | Status: DC
  Filled 2022-01-10: qty 180, 90d supply, fill #0
  Filled 2022-05-20: qty 180, 90d supply, fill #1
  Filled 2022-08-11: qty 60, 30d supply, fill #2

## 2022-01-10 MED ORDER — LEVOTHYROXINE SODIUM 50 MCG PO TABS
ORAL_TABLET | ORAL | 6 refills | Status: DC
  Filled 2022-01-10: qty 90, 90d supply, fill #0

## 2022-02-24 ENCOUNTER — Other Ambulatory Visit (HOSPITAL_COMMUNITY): Payer: Self-pay

## 2022-02-24 MED ORDER — LEVOTHYROXINE SODIUM 75 MCG PO TABS
ORAL_TABLET | ORAL | 6 refills | Status: DC
  Filled 2022-02-24: qty 30, 30d supply, fill #0
  Filled 2022-03-31: qty 30, 30d supply, fill #1
  Filled 2022-05-06: qty 30, 30d supply, fill #2
  Filled 2022-06-07: qty 30, 30d supply, fill #3
  Filled 2022-07-11: qty 30, 30d supply, fill #4
  Filled 2022-08-11: qty 30, 30d supply, fill #5
  Filled 2022-09-26: qty 30, 30d supply, fill #6

## 2022-03-10 ENCOUNTER — Other Ambulatory Visit (HOSPITAL_COMMUNITY): Payer: Self-pay

## 2022-03-10 MED ORDER — DULOXETINE HCL 30 MG PO CPEP
ORAL_CAPSULE | ORAL | 0 refills | Status: DC
  Filled 2022-03-10: qty 53, 30d supply, fill #0

## 2022-03-18 ENCOUNTER — Other Ambulatory Visit: Payer: Self-pay | Admitting: Endocrinology

## 2022-03-18 DIAGNOSIS — E010 Iodine-deficiency related diffuse (endemic) goiter: Secondary | ICD-10-CM

## 2022-03-21 ENCOUNTER — Ambulatory Visit
Admission: RE | Admit: 2022-03-21 | Discharge: 2022-03-21 | Disposition: A | Discharge status: Home / Self Care | Payer: No Typology Code available for payment source | Source: Ambulatory Visit | Attending: Endocrinology | Admitting: Endocrinology

## 2022-03-21 ENCOUNTER — Other Ambulatory Visit: Payer: Self-pay

## 2022-03-21 DIAGNOSIS — E010 Iodine-deficiency related diffuse (endemic) goiter: Secondary | ICD-10-CM

## 2022-03-25 ENCOUNTER — Other Ambulatory Visit (HOSPITAL_COMMUNITY): Payer: Self-pay

## 2022-03-25 MED ORDER — DULOXETINE HCL 60 MG PO CPEP
ORAL_CAPSULE | ORAL | 0 refills | Status: DC
  Filled 2022-03-25: qty 30, 30d supply, fill #0

## 2022-03-31 ENCOUNTER — Other Ambulatory Visit (HOSPITAL_COMMUNITY): Payer: Self-pay

## 2022-04-11 ENCOUNTER — Other Ambulatory Visit: Payer: No Typology Code available for payment source

## 2022-04-18 ENCOUNTER — Encounter: Payer: No Typology Code available for payment source | Admitting: Physician Assistant

## 2022-04-26 ENCOUNTER — Other Ambulatory Visit (HOSPITAL_COMMUNITY): Payer: Self-pay

## 2022-04-26 MED ORDER — BUPROPION HCL ER (XL) 150 MG PO TB24
ORAL_TABLET | ORAL | 1 refills | Status: DC
  Filled 2022-04-26: qty 30, 30d supply, fill #0

## 2022-04-26 MED ORDER — DULOXETINE HCL 30 MG PO CPEP
ORAL_CAPSULE | ORAL | 0 refills | Status: DC
  Filled 2022-04-26: qty 7, 7d supply, fill #0

## 2022-05-06 ENCOUNTER — Ambulatory Visit
Admission: EM | Admit: 2022-05-06 | Discharge: 2022-05-06 | Disposition: A | Discharge status: Home / Self Care | Payer: No Typology Code available for payment source | Attending: Family Medicine | Admitting: Family Medicine

## 2022-05-06 ENCOUNTER — Other Ambulatory Visit (HOSPITAL_COMMUNITY): Payer: Self-pay

## 2022-05-06 DIAGNOSIS — J069 Acute upper respiratory infection, unspecified: Secondary | ICD-10-CM | POA: Diagnosis present

## 2022-05-06 DIAGNOSIS — J029 Acute pharyngitis, unspecified: Secondary | ICD-10-CM | POA: Diagnosis not present

## 2022-05-06 LAB — POCT RAPID STREP A (OFFICE): Rapid Strep A Screen: NEGATIVE

## 2022-05-06 MED ORDER — NAPROXEN 500 MG PO TABS: 500.0000 mg | ORAL_TABLET | Freq: Two times a day (BID) | ORAL | 0 refills | Status: DC | PRN

## 2022-05-06 MED ORDER — ONDANSETRON 4 MG PO TBDP
4.0000 mg | ORAL_TABLET | Freq: Three times a day (TID) | ORAL | 0 refills | Status: DC | PRN
Start: 2022-05-06 — End: 2022-07-27

## 2022-05-06 MED ORDER — BENZONATATE 100 MG PO CAPS: 100.0000 mg | ORAL_CAPSULE | Freq: Three times a day (TID) | ORAL | 0 refills | Status: DC | PRN

## 2022-05-06 NOTE — Discharge Instructions (Addendum)
Rapid strep test was negative; throat culture is sent, and staff will call you if positive ? ?Benzonatate 100 mg--1 capsule 3 times daily as needed for cough  ? ?Ondansetron dissolved in the mouth every 8 hours as needed for nausea or vomiting. ? ?Naproxen 500 mg --1 tab twice daily as needed for pain. You can also use cepacol spray. ? ? ?You have been swabbed for COVID, and the test will result in the next 24 hours. Our staff will call you if positive. If the test is positive, you should quarantine for 5 days.  ?

## 2022-05-06 NOTE — ED Triage Notes (Signed)
Pt c/o nasal congestion and drainage, sore throat, cough, right ear pressure, headache,  ? ?Denies nausea, vomiting ? ?Onset ~ Wednesday. States son and husband recently dx with URI.  ?

## 2022-05-06 NOTE — ED Provider Notes (Signed)
[facility]    CSN: [id number] Arrival date & time: 05/06/22  0923      History   Chief Complaint Chief Complaint  Patient presents with   Cough   Sore Throat    HPI Madison Cooper is a 31 y.o. female.    Cough Sore Throat  Here for a 2-day history of sore throat, congestion, and cough.  She has had some nausea and did throw up once.  Throat is the most prominent symptom she has had temperature to 100.5.  She is also had some chills.  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  Ibuprofen 400 mg has not been helping  Past Medical History:  Diagnosis Date   Allergy    Anxiety    Phreesia 04/15/2021   Hypothyroidism    Insulin resistance    Spondylisthesis    Thyroid disease    Phreesia 04/15/2021    Patient Active Problem List   Diagnosis Date Noted   Indication for care in labor or delivery 05/15/2021   SVD (spontaneous vaginal delivery) 05/15/2021   Pregnancy 04/16/2021   Irregular periods 04/16/2021   Hypothyroidism 04/16/2021   Hashimoto's thyroiditis 04/16/2021   Eustachian tube dysfunction, left 12/15/2020   Tiredness 04/15/2020   Sleep difficulties 04/15/2020   Motion sickness 07/23/2019   Anxiety and depression 06/24/2019   Contact dermatitis 02/05/2019   Acute hip pain, left 02/05/2019   Healthcare maintenance 01/22/2019   Spondylolisthesis 01/22/2019   BMI 38.0-38.9,adult 01/22/2019    Past Surgical History:  Procedure Laterality Date   ANKLE SURGERY Left 2010   FRACTURE SURGERY N/A    Phreesia 04/15/2021   MANDIBLE SURGERY  11/15/12   TONSILLECTOMY AND ADENOIDECTOMY  1/12   TYMPANOSTOMY TUBE PLACEMENT     WISDOM TOOTH EXTRACTION  12/11    OB History     Gravida  1   Para  0   Term  0   Preterm  0   AB  0   Living  0      SAB  0   IAB  0   Ectopic  0   Multiple  0   Live Births  0            Home Medications    Prior to  Admission medications   Medication Sig Start Date End Date Taking? Authorizing Provider  benzonatate (TESSALON) 100 MG capsule Take 1 capsule (100 mg total) by mouth 3 (three) times daily as needed for cough. 05/06/22  Yes Zenia Resides, MD  naproxen (NAPROSYN) 500 MG tablet Take 1 tablet (500 mg total) by mouth 2 (two) times daily as needed. 05/06/22  Yes Zenia Resides, MD  ondansetron (ZOFRAN-ODT) 4 MG disintegrating tablet Take 1 tablet (4 mg total) by mouth every 8 (eight) hours as needed for nausea or vomiting. 05/06/22  Yes Hazelyn Kallen, Janace Aris, MD  acetaminophen (TYLENOL) 325 MG tablet Take 650 mg by mouth every 6 (six) hours as needed.    [provider]  buPROPion (WELLBUTRIN XL) 150 MG 24 hr tablet Take 1 tablet by mouth in the morning 04/26/22     levothyroxine (SYNTHROID) 75 MCG tablet Take 1 tablet by mouth once daily in the morning on an empty stomach 02/24/22     loratadine (CLARITIN) 10 MG tablet Take 10 mg by mouth daily.    [provider]  metFORMIN (GLUCOPHAGE-XR) 500 MG 24 hr tablet Take 1 tablet  by mouth daily with evening meal. 07/12/21     metFORMIN (GLUCOPHAGE-XR) 500 MG 24 hr tablet Take 1 tablet by mouth twice a day 01/10/22     escitalopram (LEXAPRO) 10 MG tablet Take 1 tablet (10 mg total) by mouth daily. 07/23/19 02/28/20  William Hamburger D, NP  etonogestrel-ethinyl estradiol (NUVARING) 0.12-0.015 MG/24HR vaginal ring Use for 3 weeks, then remove for 1 week. 12/28/16 02/28/20  Ria Comment, FNP    Family History Family History  Problem Relation Age of Onset   Cancer Mother        basal cell   Kidney disease Mother        kidney stones   Alcohol abuse Father    Heart attack Father    Gout Father    Alcohol abuse Brother    Depression Brother    Heart disease Maternal Grandfather    Diabetes Maternal Grandfather    Heart attack Maternal Grandfather    Hypertension Maternal Grandfather    Heart disease Paternal Grandfather    Diabetes Paternal  Grandfather    Cancer Paternal Grandfather        lymphoma   Hypothyroidism Maternal Grandmother    Alzheimer's disease Paternal Grandmother    Parkinson's disease Paternal Grandmother    Scoliosis Paternal Grandmother     Social History Social History   Tobacco Use   Smoking status: Never   Smokeless tobacco: Never  Vaping Use   Vaping Use: Never used  Substance Use Topics   Alcohol use: No    Alcohol/week: 0.0 standard drinks    Comment: Less than 1    Drug use: No     Allergies   Latex, Other, Diclofenac, Adhesive [tape], and Sulfa antibiotics   Review of Systems Review of Systems  Respiratory:  Positive for cough.     Physical Exam Triage Vital Signs ED Triage Vitals  Enc Vitals Group     BP 05/06/22 1034 137/87     Pulse Rate 05/06/22 1034 (!) 121     Resp 05/06/22 1034 18     Temp 05/06/22 1034 99.4 F (37.4 C)     Temp Source 05/06/22 1034 Oral     SpO2 05/06/22 1034 96 %     Weight --      Height --      Head Circumference --      Peak Flow --      Pain Score 05/06/22 1035 0     Pain Loc --      Pain Edu? --      Excl. in [place]? --    No data found.  Updated Vital Signs BP 137/87 (BP Location: Right Arm)   Pulse (!) 121   Temp 99.4 F (37.4 C) (Oral)   Resp 18   SpO2 96%   Breastfeeding No   Visual Acuity Right Eye Distance:   Left Eye Distance:   Bilateral Distance:    Right Eye Near:   Left Eye Near:    Bilateral Near:     Physical Exam Vitals reviewed.  Constitutional:      General: She is not in acute distress.    Appearance: She is not toxic-appearing.  HENT:     Right Ear: Tympanic membrane and ear canal normal.     Left Ear: Tympanic membrane and ear canal normal.     Nose: Nose normal.     Mouth/Throat:     Mouth: Mucous membranes are moist.     Comments: There  is erythema of the soft palate.  No asymmetry.  No tonsillar hypertrophy.  There is clear mucus draining in the oropharynx Eyes:     Extraocular Movements:  Extraocular movements intact.     Conjunctiva/sclera: Conjunctivae normal.     Pupils: Pupils are equal, round, and reactive to light.  Cardiovascular:     Rate and Rhythm: Normal rate and regular rhythm.     Heart sounds: No murmur heard. Pulmonary:     Effort: Pulmonary effort is normal. No respiratory distress.     Breath sounds: No wheezing, rhonchi or rales.  Chest:     Chest wall: No tenderness.  Musculoskeletal:     Cervical back: Neck supple.  Lymphadenopathy:     Cervical: No cervical adenopathy.  Skin:    Capillary Refill: Capillary refill takes less than 2 seconds.     Coloration: Skin is not jaundiced or pale.  Neurological:     General: No focal deficit present.     Mental Status: She is alert and oriented to person, place, and time.  Psychiatric:        Behavior: Behavior normal.     UC Treatments / Results  Labs (all labs ordered are listed, but only abnormal results are displayed) Labs Reviewed  NOVEL CORONAVIRUS, NAA  CULTURE, GROUP A STREP [facility]  POCT RAPID STREP A (OFFICE)    EKG   Radiology No results found.  Procedures Procedures (including critical care time)  Medications Ordered in UC Medications - No data to display  Initial Impression / Assessment and Plan / UC Course  I have reviewed the triage vital signs and the nursing notes.  Pertinent labs & imaging results that were available during my care of the patient were reviewed by me and considered in my medical decision making (see chart for details).     Rapid strep is negative, throat culture is sent, and will notify per protocol if positive  COVID swab done today.  She is at risk for severe disease, and should receive a prescription for Paxlovid if her test is positive.  Last renal function in the chart is Final Clinical Impressions(s) / UC Diagnoses   Final diagnoses:  Viral upper respiratory tract infection  Sore throat   Discharge Instructions   None    ED Prescriptions      Medication Sig Dispense Auth. Provider   ondansetron (ZOFRAN-ODT) 4 MG disintegrating tablet Take 1 tablet (4 mg total) by mouth every 8 (eight) hours as needed for nausea or vomiting. 10 tablet Zenia ResidesBanister, Emoni Yang K, MD   benzonatate (TESSALON) 100 MG capsule Take 1 capsule (100 mg total) by mouth 3 (three) times daily as needed for cough. 21 capsule Zenia ResidesBanister, Jupiter Boys K, MD   naproxen (NAPROSYN) 500 MG tablet Take 1 tablet (500 mg total) by mouth 2 (two) times daily as needed. 30 tablet Amiracle Neises, Janace ArisPamela K, MD      PDMP not reviewed this encounter.   Zenia ResidesBanister, Osamah Schmader K, MD 05/06/22 1104

## 2022-05-07 ENCOUNTER — Ambulatory Visit
Admission: EM | Admit: 2022-05-07 | Discharge: 2022-05-07 | Disposition: A | Discharge status: Home / Self Care | Payer: No Typology Code available for payment source | Attending: Emergency Medicine | Admitting: Emergency Medicine

## 2022-05-07 ENCOUNTER — Encounter: Payer: Self-pay | Admitting: Emergency Medicine

## 2022-05-07 ENCOUNTER — Telehealth: Payer: Self-pay | Admitting: Emergency Medicine

## 2022-05-07 DIAGNOSIS — R062 Wheezing: Secondary | ICD-10-CM

## 2022-05-07 DIAGNOSIS — B309 Viral conjunctivitis, unspecified: Secondary | ICD-10-CM

## 2022-05-07 DIAGNOSIS — J4521 Mild intermittent asthma with (acute) exacerbation: Secondary | ICD-10-CM

## 2022-05-07 LAB — NOVEL CORONAVIRUS, NAA: SARS-CoV-2, NAA: NOT DETECTED

## 2022-05-07 MED ORDER — AZITHROMYCIN 250 MG PO TABS: ORAL_TABLET | ORAL | 0 refills | Status: AC

## 2022-05-07 MED ORDER — OLOPATADINE HCL 0.2 % OP SOLN: 1.0000 [drp] | Freq: Every day | OPHTHALMIC | 1 refills | Status: DC

## 2022-05-07 MED ORDER — ALBUTEROL SULFATE HFA 108 (90 BASE) MCG/ACT IN AERS: 2.0000 | INHALATION_SPRAY | Freq: Four times a day (QID) | RESPIRATORY_TRACT | 0 refills | Status: DC | PRN

## 2022-05-07 MED ORDER — DEXAMETHASONE SODIUM PHOSPHATE 10 MG/ML IJ SOLN
10.0000 mg | Freq: Once | INTRAMUSCULAR | Status: AC
  Administered 2022-05-07: 10 mg via INTRAMUSCULAR

## 2022-05-07 MED ORDER — METHYLPREDNISOLONE SODIUM SUCC 125 MG IJ SOLR: 125.0000 mg | Freq: Once | INTRAMUSCULAR | Status: DC

## 2022-05-07 MED ORDER — FLUTICASONE PROPIONATE 50 MCG/ACT NA SUSP: 1.0000 | Freq: Every day | NASAL | 1 refills | Status: DC

## 2022-05-07 MED ORDER — LORATADINE 10 MG PO TABS: 10.0000 mg | ORAL_TABLET | Freq: Every day | ORAL | 1 refills | Status: DC

## 2022-05-07 NOTE — ED Triage Notes (Signed)
Patient states that she awoke this morning and both eyes are red, matted together, drainage and very itchy.  Husband and son both have been diagnosed with pink eye. ?

## 2022-05-07 NOTE — ED Provider Notes (Signed)
?[facility] ? ? ? ?CSN: [id number] ?Arrival date & time: 05/07/22  0803 ?  ? ?HISTORY  ? ?Chief Complaint  ?Patient presents with  ? Eye Problem  ? ?HPI ?Madison Cooper is a 31 y.o. female. Patient states that she awoke this morning and both eyes are red, matted together, drainage and very itchy.  Husband and son both have been diagnosed with pink eye.  Per EMR, patient was seen yesterday here at the same urgent care clinic where she was diagnosed with a viral respiratory infection.  Strep test yesterday was negative. ? ?The history is provided by the patient.  ?Past Medical History:  ?Diagnosis Date  ? Allergy   ? Anxiety   ? Phreesia 04/15/2021  ? Hypothyroidism   ? Insulin resistance   ? Spondylisthesis   ? Thyroid disease   ? Phreesia 04/15/2021  ? ?Patient Active Problem List  ? Diagnosis Date Noted  ? Indication for care in labor or delivery 05/15/2021  ? SVD (spontaneous vaginal delivery) 05/15/2021  ? Pregnancy 04/16/2021  ? Irregular periods 04/16/2021  ? Hypothyroidism 04/16/2021  ? Hashimoto's thyroiditis 04/16/2021  ? Eustachian tube dysfunction, left 12/15/2020  ? Tiredness 04/15/2020  ? Sleep difficulties 04/15/2020  ? Motion sickness 07/23/2019  ? Anxiety and depression 06/24/2019  ? Contact dermatitis 02/05/2019  ? Acute hip pain, left 02/05/2019  ? Healthcare maintenance 01/22/2019  ? Spondylolisthesis 01/22/2019  ? BMI 38.0-38.9,adult 01/22/2019  ? ?Past Surgical History:  ?Procedure Laterality Date  ? ANKLE SURGERY Left 2010  ? FRACTURE SURGERY N/A   ? Phreesia 04/15/2021  ? MANDIBLE SURGERY  11/15/12  ? TONSILLECTOMY AND ADENOIDECTOMY  1/12  ? TYMPANOSTOMY TUBE PLACEMENT    ? WISDOM TOOTH EXTRACTION  12/11  ? ?OB History   ? ? Gravida  ?1  ? Para  ?0  ? Term  ?0  ? Preterm  ?0  ? AB  ?0  ? Living  ?0  ?  ? ? SAB  ?0  ? IAB  ?0  ? Ectopic  ?0  ? Multiple  ?0  ? Live Births  ?0  ?   ?  ?  ? ?Home Medications   ? ?Prior to Admission medications   ?Medication Sig Start Date End Date  Taking? Authorizing Provider  ?acetaminophen (TYLENOL) 325 MG tablet Take 650 mg by mouth every 6 (six) hours as needed.   Yes [provider]  ?azithromycin (ZITHROMAX) 250 MG tablet Take 2 tablets (500 mg total) by mouth daily for 1 day, THEN 1 tablet (250 mg total) daily for 4 days. 05/07/22 05/12/22 Yes Lynden Oxford Scales, PA-C  ?benzonatate (TESSALON) 100 MG capsule Take 1 capsule (100 mg total) by mouth 3 (three) times daily as needed for cough. 05/06/22  Yes Barrett Henle, MD  ?buPROPion (WELLBUTRIN XL) 150 MG 24 hr tablet Take 1 tablet by mouth in the morning 04/26/22  Yes   ?fluticasone (FLONASE) 50 MCG/ACT nasal spray Place 1 spray into both nostrils daily. Begin by using 2 sprays in each nare daily for 3 to 5 days, then decrease to 1 spray in each nare daily. 05/07/22  Yes Lynden Oxford Scales, PA-C  ?levothyroxine (SYNTHROID) 75 MCG tablet Take 1 tablet by mouth once daily in the morning on an empty stomach 02/24/22  Yes   ?loratadine (CLARITIN) 10 MG tablet Take 1 tablet (10 mg total) by mouth daily. 05/07/22 11/03/22 Yes Lynden Oxford Scales, PA-C  ?metFORMIN (GLUCOPHAGE-XR) 500 MG 24 hr  tablet Take 1 tablet by mouth daily with evening meal. 07/12/21  Yes   ?metFORMIN (GLUCOPHAGE-XR) 500 MG 24 hr tablet Take 1 tablet by mouth twice a day 01/10/22  Yes   ?naproxen (NAPROSYN) 500 MG tablet Take 1 tablet (500 mg total) by mouth 2 (two) times daily as needed. 05/06/22  Yes Barrett Henle, MD  ?Olopatadine HCl (PATADAY) 0.2 % SOLN Apply 1 drop to eye daily. 05/07/22  Yes Lynden Oxford Scales, PA-C  ?ondansetron (ZOFRAN-ODT) 4 MG disintegrating tablet Take 1 tablet (4 mg total) by mouth every 8 (eight) hours as needed for nausea or vomiting. 05/06/22  Yes Barrett Henle, MD  ?escitalopram (LEXAPRO) 10 MG tablet Take 1 tablet (10 mg total) by mouth daily. 07/23/19 02/28/20  Mina Marble D, NP  ?etonogestrel-ethinyl estradiol (NUVARING) 0.12-0.015 MG/24HR vaginal ring Use for 3 weeks, then  remove for 1 week. 12/28/16 02/28/20  Kem Boroughs, Carl  ? ?Family History ?Family History  ?Problem Relation Age of Onset  ? Cancer Mother   ?     basal cell  ? Kidney disease Mother   ?     kidney stones  ? Alcohol abuse Father   ? Heart attack Father   ? Gout Father   ? Alcohol abuse Brother   ? Depression Brother   ? Heart disease Maternal Grandfather   ? Diabetes Maternal Grandfather   ? Heart attack Maternal Grandfather   ? Hypertension Maternal Grandfather   ? Heart disease Paternal Grandfather   ? Diabetes Paternal Grandfather   ? Cancer Paternal Grandfather   ?     lymphoma  ? Hypothyroidism Maternal Grandmother   ? Alzheimer's disease Paternal Grandmother   ? Parkinson's disease Paternal Grandmother   ? Scoliosis Paternal Grandmother   ? ?Social History ?Social History  ? ?Tobacco Use  ? Smoking status: Never  ? Smokeless tobacco: Never  ?Vaping Use  ? Vaping Use: Never used  ?Substance Use Topics  ? Alcohol use: No  ?  Alcohol/week: 0.0 standard drinks  ?  Comment: Less than 1   ? Drug use: No  ? ?Allergies   ?Latex, Other, Diclofenac, Adhesive [tape], and Sulfa antibiotics ? ?Review of Systems ?Review of Systems ?Pertinent findings noted in history of present illness.  ? ?Physical Exam ?Triage Vital Signs ?ED Triage Vitals  ?Enc Vitals Group  ?   BP 10/22/21 0827 (!) 147/82  ?   Pulse Rate 10/22/21 0827 72  ?   Resp 10/22/21 0827 18  ?   Temp 10/22/21 0827 98.3 ?F (36.8 ?C)  ?   Temp Source 10/22/21 0827 Oral  ?   SpO2 10/22/21 0827 98 %  ?   Weight --   ?   Height --   ?   Head Circumference --   ?   Peak Flow --   ?   Pain Score 10/22/21 0826 5  ?   Pain Loc --   ?   Pain Edu? --   ?   Excl. in [place]? --   ?No data found. ? ?Updated Vital Signs ?BP 134/89 (BP Location: Left Arm)   Pulse (!) 113   Temp 98.2 ?F (36.8 ?C) (Oral)   Resp 18   Ht 5\' 10"  (1.778 m)   Wt (!) 302 lb (137 kg)   LMP 04/24/2022   SpO2 95%   BMI 43.33 kg/m?  ? ?Physical Exam ?Vitals and nursing note reviewed.  ?Constitutional:    ?   General:  She is not in acute distress. ?   Appearance: Normal appearance. She is not ill-appearing or toxic-appearing.  ?HENT:  ?   Head: Normocephalic and atraumatic. No right periorbital erythema or left periorbital erythema.  ?   Salivary Glands: Right salivary gland is not diffusely enlarged or tender. Left salivary gland is not diffusely enlarged or tender.  ?   Right Ear: Hearing, tympanic membrane, ear canal and external ear normal. No drainage. No middle ear effusion. There is no impacted cerumen. Tympanic membrane is not erythematous or bulging.  ?   Left Ear: Hearing, tympanic membrane, ear canal and external ear normal. No drainage.  No middle ear effusion. There is no impacted cerumen. Tympanic membrane is not erythematous or bulging.  ?   Nose: Mucosal edema, congestion and rhinorrhea present. No nasal deformity or septal deviation. Rhinorrhea is clear.  ?   Right Turbinates: Enlarged. Not swollen or pale.  ?   Left Turbinates: Enlarged. Not swollen or pale.  ?   Right Sinus: No maxillary sinus tenderness or frontal sinus tenderness.  ?   Left Sinus: No maxillary sinus tenderness or frontal sinus tenderness.  ?   Mouth/Throat:  ?   Lips: Pink. No lesions.  ?   Mouth: Mucous membranes are moist. No oral lesions.  ?   Pharynx: Oropharynx is clear. Uvula midline. Posterior oropharyngeal erythema and uvula swelling present. No pharyngeal swelling or oropharyngeal exudate.  ?   Tonsils: No tonsillar exudate. 1+ on the right. 1+ on the left.  ?   Comments: There is erythema of the soft palate.  No asymmetry.  No tonsillar hypertrophy.  There is clear mucus draining in the oropharynx ?Eyes:  ?   General: Lids are normal. Lids are everted, no foreign bodies appreciated. Vision grossly intact. Gaze aligned appropriately. Allergic shiner present. No visual field deficit or scleral icterus.    ?   Right eye: No foreign body, discharge or hordeolum.     ?   Left eye: No foreign body, discharge or hordeolum.  ?    Extraocular Movements: Extraocular movements intact.  ?   Conjunctiva/sclera:  ?   Right eye: Right conjunctiva is injected. No chemosis or exudate. ?   Left eye: Left conjunctiva is injected. No chemosis or ex

## 2022-05-07 NOTE — Discharge Instructions (Addendum)
Your symptoms and physical exam findings are concerning for a viral respiratory infection.   ?  ?Please see the list below for recommended medications, dosages and frequencies to provide relief of your current symptoms:   ? ?Z-Pak (azithromycin):  take 2 tablets the first day, then take 1 tablet daily every day thereafter until complete. ? ?Your symptoms and my physical exam findings are concerning for exacerbation of your underlying allergies.  It is important that you begin your allergy regimen now and are consistent with taking allergy medications exactly as prescribed.  Allergy medications are preventative and therefore only work well when they are taken daily, not "as needed". ?  ?Please see the list below for recommended medications, dosages and frequencies to provide relief of current symptoms:   ?  ?Decadron IM (dexamethasone):  To quickly address your significant respiratory inflammation, you were provided with an injection of Decadron in the office today.  You should continue to feel the full benefit of the steroid for the next 12-24 hours.   ?  ?Claritin (loratadine): This is an excellent second-generation antihistamine that helps to reduce respiratory inflammatory response to environmental allergens.  This medication is not known to cause daytime sleepiness so it can be taken in the daytime.  If you find that it does make you sleepy, please feel free to take it at bedtime. ?  ?Flonase (fluticasone): This is a steroid nasal spray that you use once daily, 1 spray in each nare.  This medication does not work well if you decide to use it only used as you feel you need to, it works best used on a daily basis.  After 3 to 5 days of use, you will notice significant reduction of the inflammation and mucus production that is currently being caused by exposure to allergens, whether seasonal or environmental.  The most common side effect of this medication is nosebleeds.  If you experience a nosebleed, please  discontinue use for 1 week, then feel free to resume.  I have provided you with a prescription.   ?  ?ProAir, Ventolin, Proventil (albuterol): This inhaled medication contains a short acting beta agonist bronchodilator.  This medication works on the smooth muscle that opens and constricts of your airways by relaxing the muscle.  The result of relaxation of the smooth muscle is increased air movement and improved work of breathing.  This is a short acting medication that can be used every 4-6 hours as needed for increased work of breathing, shortness of breath, wheezing and excessive coughing.  I have provided you with a prescription.  ?  ?Pataday (olopatadine): This is an antihistamine eyedrop that can be used once daily to help relieve dry eyes, itchy eyes and red eyes.  This antihistamine drop not only works for allergic conjunctivitis but is also very helpful with viral conjunctivitis.  Please do not use this drop more than once a day, for best relief please use this in the morning.   ?  ?If your insurance will not cover your allergy medications, please consider downloading the Good Rx app which is free.  You can find considerable discounts on prescription and over-the-counter medications. ?  ?If you find that you have not had improvement of your symptoms in the next 3 to 5 days, please follow-up with your primary care provider or return here to urgent care for repeat evaluation and further recommendations. ?  ?Thank you for visiting urgent care today.  We appreciate the opportunity to participate in your care. ? ?

## 2022-05-07 NOTE — Telephone Encounter (Signed)
Med not sent during visit.  ?

## 2022-05-09 ENCOUNTER — Other Ambulatory Visit (HOSPITAL_COMMUNITY): Payer: Self-pay

## 2022-05-09 LAB — CULTURE, GROUP A STREP (THRC)

## 2022-05-09 MED ORDER — BUPROPION HCL ER (XL) 150 MG PO TB24
150.0000 mg | ORAL_TABLET | Freq: Every morning | ORAL | 3 refills | Status: DC
  Filled 2022-05-09: qty 90, 90d supply, fill #0

## 2022-05-09 MED ORDER — BUPROPION HCL ER (XL) 300 MG PO TB24
300.0000 mg | ORAL_TABLET | Freq: Every morning | ORAL | 3 refills | Status: DC
  Filled 2022-05-09 – 2022-07-06 (×2): qty 90, 90d supply, fill #0
  Filled 2022-10-21: qty 90, 90d supply, fill #1
  Filled 2023-01-16: qty 90, 90d supply, fill #2
  Filled 2023-04-11: qty 90, 90d supply, fill #3

## 2022-05-18 ENCOUNTER — Other Ambulatory Visit (HOSPITAL_COMMUNITY): Payer: Self-pay

## 2022-05-20 ENCOUNTER — Other Ambulatory Visit (HOSPITAL_COMMUNITY): Payer: Self-pay

## 2022-05-30 ENCOUNTER — Other Ambulatory Visit (HOSPITAL_COMMUNITY): Payer: Self-pay

## 2022-05-30 MED ORDER — HYDROCODONE BIT-HOMATROP MBR 5-1.5 MG/5ML PO SOLN
ORAL | 0 refills | Status: DC
  Filled 2022-05-30: qty 120, 7d supply, fill #0

## 2022-05-30 MED ORDER — PREDNISONE 10 MG PO TABS
ORAL_TABLET | ORAL | 0 refills | Status: DC
  Filled 2022-05-30: qty 21, 10d supply, fill #0

## 2022-06-02 ENCOUNTER — Ambulatory Visit (INDEPENDENT_AMBULATORY_CARE_PROVIDER_SITE_OTHER): Payer: No Typology Code available for payment source

## 2022-06-02 ENCOUNTER — Ambulatory Visit
Admission: EM | Admit: 2022-06-02 | Discharge: 2022-06-02 | Disposition: A | Discharge status: Home / Self Care | Payer: No Typology Code available for payment source | Attending: Internal Medicine | Admitting: Internal Medicine

## 2022-06-02 DIAGNOSIS — S2231XA Fracture of one rib, right side, initial encounter for closed fracture: Secondary | ICD-10-CM | POA: Diagnosis not present

## 2022-06-02 MED ORDER — TRAMADOL HCL 50 MG PO TABS: 50.0000 mg | ORAL_TABLET | Freq: Four times a day (QID) | ORAL | 0 refills | Status: DC | PRN

## 2022-06-02 NOTE — ED Triage Notes (Signed)
Patient presents to Urgent Care with complaints of cough since early may recently diagnosed with bronchitis pt reports she was coughing this evening when felt and heard a pop on her right side back since about 4 pm. Patient reports extreme pain. Pt reports 1000 mg tylenol and 800 mg ibuprofen .

## 2022-06-02 NOTE — ED Provider Notes (Signed)
[facility]    CSN: [id number] Arrival date & time: 06/02/22  1837      History   Chief Complaint Chief Complaint  Patient presents with   Cough    sid   side pain    HPI Madison Cooper is a 31 y.o. female.   Patient presents with right-sided pain that started today.  Patient reports that she has been treated for bronchitis and has been having harsh coughing fits.  She had a harsh coughing fit at approximately 4 PM today when she heard and felt a pop in the right side of her chest that radiates around to her right back.  She been having subsequent pain with this.  Patient has taken Tylenol and ibuprofen with no improvement.  Movement exacerbates pain.  She reports that she had similar situation a few years prior with a coughing fit but nobody could figure out what was wrong with her.  She reports that the pain eventually resolved on its own.  Denies any shortness of breath or fever.   Cough   Past Medical History:  Diagnosis Date   Allergy    Anxiety    Phreesia 04/15/2021   Hypothyroidism    Insulin resistance    Spondylisthesis    Thyroid disease    Phreesia 04/15/2021    Patient Active Problem List   Diagnosis Date Noted   Indication for care in labor or delivery 05/15/2021   SVD (spontaneous vaginal delivery) 05/15/2021   Pregnancy 04/16/2021   Irregular periods 04/16/2021   Hypothyroidism 04/16/2021   Hashimoto's thyroiditis 04/16/2021   Eustachian tube dysfunction, left 12/15/2020   Tiredness 04/15/2020   Sleep difficulties 04/15/2020   Motion sickness 07/23/2019   Anxiety and depression 06/24/2019   Contact dermatitis 02/05/2019   Acute hip pain, left 02/05/2019   Healthcare maintenance 01/22/2019   Spondylolisthesis 01/22/2019   BMI 38.0-38.9,adult 01/22/2019    Past Surgical History:  Procedure Laterality Date   ANKLE SURGERY Left 2010   FRACTURE SURGERY N/A    Phreesia 04/15/2021   MANDIBLE SURGERY  11/15/12   TONSILLECTOMY AND  ADENOIDECTOMY  1/12   TYMPANOSTOMY TUBE PLACEMENT     WISDOM TOOTH EXTRACTION  12/11    OB History     Gravida  1   Para  0   Term  0   Preterm  0   AB  0   Living  0      SAB  0   IAB  0   Ectopic  0   Multiple  0   Live Births  0            Home Medications    Prior to Admission medications   Medication Sig Start Date End Date Taking? Authorizing Provider  traMADol (ULTRAM) 50 MG tablet Take 1 tablet (50 mg total) by mouth every 6 (six) hours as needed for severe pain. 06/02/22  Yes Cheryl Chay, Rolly SalterHaley E, FNP  acetaminophen (TYLENOL) 325 MG tablet Take 650 mg by mouth every 6 (six) hours as needed.    [provider]  albuterol (VENTOLIN HFA) 108 (90 Base) MCG/ACT inhaler Inhale 2 puffs into the lungs every 6 (six) hours as needed for wheezing or shortness of breath (Cough). 05/07/22   Theadora RamaMorgan, Lindsay Scales, PA-C  benzonatate (TESSALON) 100 MG capsule Take 1 capsule (100 mg total) by mouth 3 (three) times daily as needed for cough. Patient not taking: Reported on 06/02/2022 05/06/22   Zenia ResidesBanister, Pamela K, MD  buPROPion (WELLBUTRIN XL) 150 MG 24 hr tablet Take 1 tablet (150 mg total) by mouth every morning. 05/09/22     buPROPion (WELLBUTRIN XL) 300 MG 24 hr tablet Take 1 tablet (300 mg total) by mouth every morning. 05/09/22     fluticasone (FLONASE) 50 MCG/ACT nasal spray Place 1 spray into both nostrils daily. Begin by using 2 sprays in each nare daily for 3 to 5 days, then decrease to 1 spray in each nare daily. 05/07/22   Theadora Rama Scales, PA-C  HYDROcodone bit-homatropine (HYDROMET) 5-1.5 MG/5ML syrup Take 5 mls by mouth as needed every 6 hours for 7 days 05/30/22     levothyroxine (SYNTHROID) 75 MCG tablet Take 1 tablet by mouth once daily in the morning on an empty stomach 02/24/22     loratadine (CLARITIN) 10 MG tablet Take 1 tablet (10 mg total) by mouth daily. 05/07/22 11/03/22  Theadora Rama Scales, PA-C  metFORMIN (GLUCOPHAGE-XR) 500 MG 24 hr tablet Take 1  tablet by mouth daily with evening meal. 07/12/21     metFORMIN (GLUCOPHAGE-XR) 500 MG 24 hr tablet Take 1 tablet by mouth twice a day 01/10/22     naproxen (NAPROSYN) 500 MG tablet Take 1 tablet (500 mg total) by mouth 2 (two) times daily as needed. Patient not taking: Reported on 06/02/2022 05/06/22   Zenia Resides, MD  Olopatadine HCl (PATADAY) 0.2 % SOLN Apply 1 drop to eye daily. Patient not taking: Reported on 06/02/2022 05/07/22   Theadora Rama Scales, PA-C  ondansetron (ZOFRAN-ODT) 4 MG disintegrating tablet Take 1 tablet (4 mg total) by mouth every 8 (eight) hours as needed for nausea or vomiting. 05/06/22   Zenia Resides, MD  predniSONE (DELTASONE) 10 MG tablet Take 4 tablets by mouth daily for 2 days-- 3 tablets daily for 2 days-- 2 tablets daily for 2 days-- 1 tablet daily for 2 days-- 1/2 tablet daily for 2 days 05/30/22     escitalopram (LEXAPRO) 10 MG tablet Take 1 tablet (10 mg total) by mouth daily. 07/23/19 02/28/20  William Hamburger D, NP  etonogestrel-ethinyl estradiol (NUVARING) 0.12-0.015 MG/24HR vaginal ring Use for 3 weeks, then remove for 1 week. 12/28/16 02/28/20  Ria Comment, FNP    Family History Family History  Problem Relation Age of Onset   Cancer Mother        basal cell   Kidney disease Mother        kidney stones   Alcohol abuse Father    Heart attack Father    Gout Father    Alcohol abuse Brother    Depression Brother    Heart disease Maternal Grandfather    Diabetes Maternal Grandfather    Heart attack Maternal Grandfather    Hypertension Maternal Grandfather    Heart disease Paternal Grandfather    Diabetes Paternal Grandfather    Cancer Paternal Grandfather        lymphoma   Hypothyroidism Maternal Grandmother    Alzheimer's disease Paternal Grandmother    Parkinson's disease Paternal Grandmother    Scoliosis Paternal Grandmother     Social History Social History   Tobacco Use   Smoking status: Never   Smokeless tobacco: Never  Vaping Use    Vaping Use: Never used  Substance Use Topics   Alcohol use: No    Alcohol/week: 0.0 standard drinks of alcohol    Comment: Less than 1    Drug use: No     Allergies   Latex, Other, Diclofenac, Adhesive [tape],  and Sulfa antibiotics   Review of Systems Review of Systems Per HPI  Physical Exam Triage Vital Signs ED Triage Vitals  Enc Vitals Group     BP 06/02/22 1915 121/83     Pulse Rate 06/02/22 1915 95     Resp 06/02/22 1915 18     Temp 06/02/22 1915 98.5 F (36.9 C)     Temp Source 06/02/22 1915 Oral     SpO2 06/02/22 1915 96 %     Weight --      Height --      Head Circumference --      Peak Flow --      Pain Score 06/02/22 1913 7     Pain Loc --      Pain Edu? --      Excl. in GC? --    No data found.  Updated Vital Signs BP 121/83   Pulse 95   Temp 98.5 F (36.9 C) (Oral)   Resp 18   LMP 05/26/2022 (Approximate)   SpO2 96%   Visual Acuity Right Eye Distance:   Left Eye Distance:   Bilateral Distance:    Right Eye Near:   Left Eye Near:    Bilateral Near:     Physical Exam Constitutional:      General: She is not in acute distress.    Appearance: Normal appearance. She is not toxic-appearing or diaphoretic.  HENT:     Head: Normocephalic and atraumatic.  Eyes:     Extraocular Movements: Extraocular movements intact.     Conjunctiva/sclera: Conjunctivae normal.  Cardiovascular:     Rate and Rhythm: Normal rate and regular rhythm.     Pulses: Normal pulses.     Heart sounds: Normal heart sounds.  Pulmonary:     Effort: Pulmonary effort is normal. No respiratory distress.     Breath sounds: Normal breath sounds.  Musculoskeletal:     Comments: Tenderness to palpation to right lateral side and right thoracic back.  No obvious swelling or discoloration noted.  Neurological:     General: No focal deficit present.     Mental Status: She is alert and oriented to person, place, and time. Mental status is at baseline.  Psychiatric:         Mood and Affect: Mood normal.        Behavior: Behavior normal.        Thought Content: Thought content normal.        Judgment: Judgment normal.      UC Treatments / Results  Labs (all labs ordered are listed, but only abnormal results are displayed) Labs Reviewed - No data to display  EKG   Radiology DG Ribs Unilateral W/Chest Right  Result Date: 06/02/2022 CLINICAL DATA:  Pain EXAM: RIGHT RIBS AND CHEST - 3+ VIEW COMPARISON:  01/15/2021 FINDINGS: Single-view chest demonstrates no focal opacity or pleural effusion. Normal cardiac size. No pneumothorax. Right rib series demonstrates possible nondisplaced right tenth rib fracture IMPRESSION: 1. Negative for pleural effusion or pneumothorax 2. Possible right tenth rib fracture Electronically Signed   By: Jasmine Pang M.D.   On: 06/02/2022 20:00    Procedures Procedures (including critical care time)  Medications Ordered in UC Medications - No data to display  Initial Impression / Assessment and Plan / UC Course  I have reviewed the triage vital signs and the nursing notes.  Pertinent labs & imaging results that were available during my care of the patient were reviewed by me  and considered in my medical decision making (see chart for details).     Chest x-ray showing right 10th rib nondisplaced fracture.  Suspect that coughing fit could have caused this.  Discussed supportive care and ice application.  Patient provided with contact information for orthopedist if pain persists or worsens.  Advised patient of deep breathing techniques as well.  Patient was prescribed Hydromet cough medication due to recent treatment for bronchitis.  Patient is requesting pain medication as pain is refractory to ibuprofen and Tylenol.  Will prescribe tramadol but the patient was advised that she absolutely cannot take Hydromet cough medication in combination with tramadol or at least 24 hours following.  Advised patient this medication can cause  excessive drowsiness.  Patient to not take any other sedating medications with this.  Patient verbalized understanding and appeared reasonable as far as appropriately taking pain medication.  Patient was given strict return and ER precautions.  Patient verbalized understanding and was agreeable with plan. Final Clinical Impressions(s) / UC Diagnoses   Final diagnoses:  Closed fracture of one rib of right side, initial encounter     Discharge Instructions      You have broken your 10th rib.  You have been prescribed tramadol to take as needed for pain.  Please do not take the Hydromet cough medication if you are going to take tramadol.  This can cause excessive drowsiness so do not drive while taking this medication.  Also apply ice to affected area.  You may follow-up with provided contact information for orthopedist if pain persists or worsens.  Recommend taking deep breaths as well to prevent pneumonia associated with rib fractures.     ED Prescriptions     Medication Sig Dispense Auth. Provider   traMADol (ULTRAM) 50 MG tablet Take 1 tablet (50 mg total) by mouth every 6 (six) hours as needed for severe pain. 10 tablet [place], Acie Fredrickson, [place]      I have reviewed the PDMP during this encounter.   Gustavus Bryant, [place] 06/02/22 2017

## 2022-06-02 NOTE — Discharge Instructions (Signed)
You have broken your 10th rib.  You have been prescribed tramadol to take as needed for pain.  Please do not take the Hydromet cough medication if you are going to take tramadol.  This can cause excessive drowsiness so do not drive while taking this medication.  Also apply ice to affected area.  You may follow-up with provided contact information for orthopedist if pain persists or worsens.  Recommend taking deep breaths as well to prevent pneumonia associated with rib fractures.

## 2022-06-07 ENCOUNTER — Other Ambulatory Visit (HOSPITAL_COMMUNITY): Payer: Self-pay

## 2022-06-14 ENCOUNTER — Other Ambulatory Visit (HOSPITAL_COMMUNITY): Payer: Self-pay

## 2022-06-14 MED ORDER — PSEUDOEPH-BROMPHEN-DM 30-2-10 MG/5ML PO SYRP
ORAL_SOLUTION | ORAL | 0 refills | Status: DC
  Filled 2022-06-14: qty 200, 14d supply, fill #0

## 2022-06-14 MED ORDER — FLUTICASONE-SALMETEROL 115-21 MCG/ACT IN AERO
INHALATION_SPRAY | RESPIRATORY_TRACT | 1 refills | Status: DC
  Filled 2022-06-14: qty 12, 30d supply, fill #0

## 2022-06-15 ENCOUNTER — Other Ambulatory Visit (HOSPITAL_COMMUNITY): Payer: Self-pay

## 2022-06-27 ENCOUNTER — Encounter: Payer: Self-pay | Admitting: Internal Medicine

## 2022-06-27 ENCOUNTER — Other Ambulatory Visit (HOSPITAL_COMMUNITY): Payer: Self-pay

## 2022-06-27 ENCOUNTER — Ambulatory Visit: Payer: No Typology Code available for payment source | Admitting: Internal Medicine

## 2022-06-27 VITALS — BP 118/82 | HR 96 | Ht 70.0 in | Wt 304.0 lb

## 2022-06-27 DIAGNOSIS — R058 Other specified cough: Secondary | ICD-10-CM

## 2022-06-27 MED ORDER — PREDNISONE 10 MG PO TABS
ORAL_TABLET | ORAL | 0 refills | Status: AC
  Filled 2022-06-27: qty 30, 12d supply, fill #0

## 2022-06-27 MED ORDER — MONTELUKAST SODIUM 10 MG PO TABS
10.0000 mg | ORAL_TABLET | Freq: Every day | ORAL | 5 refills | Status: DC
  Filled 2022-06-27: qty 30, 30d supply, fill #0

## 2022-06-27 NOTE — Patient Instructions (Signed)
Check in with me if you are not feeling better in 2 weeks.   I have sent a prednisone taper and montelukast to your pharmacy.

## 2022-06-27 NOTE — Progress Notes (Signed)
Madison Cooper    [id number]    07/04/1991  Primary Care Physician:Tate, Florentina Addison, NP  Referring Physician: Linus Galas, NP [address] [place],  [place] [address] Reason for Consultation: chronic cough Date of Consultation: 06/27/2022  Chief complaint:   Chief Complaint  Patient presents with   Consult    Pt consult ongoing cough started May 2023 after URI, nothing seems to help. Pt complains of coughing spells a couple times a day     HPI: Madison Cooper is a 31 y.o. woman who presents for persistent cough following URI in early May.  Known viral infection with sick contacts with son and husband.  She has been to urgent care twice and also seen PCP twice. Hydromet cough syrup and prednisone have helped and antibiotics have not. Has also taken nasal decongestants, OTC nose sprays.   She tried advair HFA and albuterol prn and those didn't help.   Similar episode happened while she was pregnant last year had symptoms for three months.   She did have childhood asthma but outgrew it. Has little memory of this.   Ongoing symptoms of cough, shortness of breath. Cough is usually dry, occasionally productive. Worse at night than during the day. It does wake her up from sleep. Denies reflux/heart burn but did have while she was pregnancy. She does have allergic rhinitis symptoms.  Takes claritin and flonase nasal spray. Sometimes takes BID anti-histamines to help with symptoms are bad.taking these regularly has not improved her cough symptoms.   Has occasional wheezing with heavy breathing but not on a regular basis.   Had sore throat when she was initially diagnose but this has resolved.  Social history:  Occupation: She works in Facilities manager as Engineer, civil (consulting) at [employer] Exposures: husband, son, two dogs Smoking history: never smoker  Social History   Occupational History   Not on file  Tobacco Use   Smoking status: Never   Smokeless tobacco: Never  Vaping Use   Vaping  Use: Never used  Substance and Sexual Activity   Alcohol use: No    Alcohol/week: 0.0 standard drinks of alcohol    Comment: Less than 1    Drug use: No   Sexual activity: Yes    Partners: Male    Birth control/protection: None    Comment: NuvaRing    Relevant family history:  Family History  Problem Relation Age of Onset   Cancer Mother        basal cell   Kidney disease Mother        kidney stones   Alcohol abuse Father    Heart attack Father    Gout Father    Alcohol abuse Brother    Depression Brother    Heart disease Maternal Grandfather    Diabetes Maternal Grandfather    Heart attack Maternal Grandfather    Hypertension Maternal Grandfather    Heart disease Paternal Grandfather    Diabetes Paternal Grandfather    Cancer Paternal Grandfather        lymphoma   Hypothyroidism Maternal Grandmother    Alzheimer's disease Paternal Grandmother    Parkinson's disease Paternal Grandmother    Scoliosis Paternal Grandmother     Past Medical History:  Diagnosis Date   Allergy    Anxiety    Phreesia 04/15/2021   Hypothyroidism    Insulin resistance    Spondylisthesis    Thyroid disease    Phreesia 04/15/2021  Past Surgical History:  Procedure Laterality Date   ANKLE SURGERY Left 2010   FRACTURE SURGERY N/A    Phreesia 04/15/2021   MANDIBLE SURGERY  11/15/12   TONSILLECTOMY AND ADENOIDECTOMY  1/12   TYMPANOSTOMY TUBE PLACEMENT     WISDOM TOOTH EXTRACTION  12/11     Physical Exam: Blood pressure 118/82, pulse 96, height 5\' 10"  (1.778 m), weight (!) 304 lb (137.9 kg), last menstrual period 05/26/2022, SpO2 95 %. Gen:      No acute distress ENT:  no nasal polyps, mucus membranes moist Lungs:    No increased respiratory effort, symmetric chest wall excursion, clear to auscultation bilaterally, no wheezes or crackles CV:         Regular rate and rhythm; no murmurs, rubs, or gallops.  No pedal edema Abd:      + bowel sounds; soft, non-tender; no  distension MSK: no acute synovitis of DIP or PIP joints, no mechanics hands.  Skin:      Warm and dry; no rashes Neuro: normal speech, no focal facial asymmetry Psych: alert and oriented x3, normal mood and affect   Data Reviewed/Medical Decision Making:  Independent interpretation of tests: Imaging:  Review of patient's chest xray rib series images revealed old rib fracture, no acute pulmonary process, specifically no pneumonia or effusion. The patient's images have been independently reviewed by me.    PFTs: I have personally reviewed the patient's PFTs and      No data to display          Labs:  Lab Results  Component Value Date   WBC 10.5 06/21/2021   HGB 15.0 06/21/2021   HCT 45.0 06/21/2021   MCV 86 06/21/2021   PLT 272 06/21/2021   Lab Results  Component Value Date   NA 137 04/16/2021   K 4.1 04/16/2021   CL 102 04/16/2021   CO2 16 (L) 04/16/2021     Immunization status:  Immunization History  Administered Date(s) Administered   Influenza-Unspecified 09/15/2015, 09/25/2018, 10/20/2019, 10/17/2020   Tdap 09/15/2010, 08/06/2020, 02/23/2021     I reviewed prior external note(s) from ED visit, PCP  I reviewed the result(s) of the labs and imaging as noted above.   I have ordered    Assessment:  Post Viral Cough Syndrome Allergic Rhinitis Possible Asthma  Plan/Recommendations:  Will trial another steroid taper since this was the only thing that helped her previiously.  Given significant rhinitis component will add singulair daily as well. She will let 04/25/2021 know in two weeks if not improved.  Discussed spiro and feno when she is feeling better to evaluate for cough variant asthma.   We discussed disease management and progression at length today.     Return to Care: Pending response to therapy.   [place], MD Pulmonary and Critical Care Medicine [facility] Office:[phone]  CC: Durel Salts, NP

## 2022-07-06 ENCOUNTER — Other Ambulatory Visit (HOSPITAL_COMMUNITY): Payer: Self-pay

## 2022-07-11 ENCOUNTER — Other Ambulatory Visit (HOSPITAL_COMMUNITY): Payer: Self-pay

## 2022-07-14 ENCOUNTER — Encounter: Payer: Self-pay | Admitting: Internal Medicine

## 2022-07-18 ENCOUNTER — Other Ambulatory Visit (HOSPITAL_COMMUNITY): Payer: Self-pay

## 2022-07-18 MED ORDER — WEGOVY 0.25 MG/0.5ML ~~LOC~~ SOAJ
SUBCUTANEOUS | 1 refills | Status: DC
  Filled 2022-07-18: qty 2, 28d supply, fill #0
  Filled 2022-08-17: qty 2, 28d supply, fill #1

## 2022-07-21 ENCOUNTER — Other Ambulatory Visit (HOSPITAL_COMMUNITY): Payer: Self-pay

## 2022-07-27 ENCOUNTER — Encounter: Payer: Self-pay | Admitting: Internal Medicine

## 2022-07-27 ENCOUNTER — Other Ambulatory Visit (HOSPITAL_COMMUNITY): Payer: Self-pay

## 2022-07-27 ENCOUNTER — Ambulatory Visit: Payer: No Typology Code available for payment source | Admitting: Internal Medicine

## 2022-07-27 VITALS — BP 114/72 | HR 98 | Temp 99.0°F | Ht 70.0 in | Wt 306.2 lb

## 2022-07-27 DIAGNOSIS — R053 Chronic cough: Secondary | ICD-10-CM | POA: Diagnosis not present

## 2022-07-27 LAB — NITRIC OXIDE: Nitric Oxide: 6

## 2022-07-27 MED ORDER — OMEPRAZOLE 40 MG PO CPDR
40.0000 mg | DELAYED_RELEASE_CAPSULE | Freq: Every day | ORAL | 5 refills | Status: DC
  Filled 2022-07-27: qty 30, 30d supply, fill #0

## 2022-07-27 NOTE — Patient Instructions (Addendum)
Please schedule follow up scheduled with myself in 2 months.  If my schedule is not open yet, we will contact you with a reminder closer to that time. Please call [phone] if you haven't heard from [place] a month before.   Keep taking the allergy meds. We are adding an acid reflux medicine once a day to see if this helps cough.   Breathing testing normal today.

## 2022-07-27 NOTE — Progress Notes (Signed)
Madison Cooper    [id number]    27-Nov-1991  Primary Care Physician:Tate, Florentina Addison, NP Date of Appointment: 07/27/2022 Established Patient Visit  Chief complaint:   Chief Complaint  Patient presents with   Follow-up    Pt states that she is still having problems with her cough. States the cough is about the same all throughout the day and will occasionally cough up phlegm.     HPI: Madison Cooper is a woman with chronic cough.  Interval Updates: Here for follow up. At last visit felt symptoms were related to post-viral cough syndrome and potentially cough variant asthma. Did a trial of prednisone and this did not help. Still coughing during the day and waking up at night. Dry cough.   Was also taking montelukast and anti-histamine (claritin.) also taking flonase nasal spray.   She is wondering if she should try acid reflux medication next.   I have reviewed the patient's family social and past medical history and updated as appropriate.   Past Medical History:  Diagnosis Date   Allergy    Anxiety    Phreesia 04/15/2021   Hypothyroidism    Insulin resistance    Spondylisthesis    Thyroid disease    Phreesia 04/15/2021    Past Surgical History:  Procedure Laterality Date   ANKLE SURGERY Left 2010   FRACTURE SURGERY N/A    Phreesia 04/15/2021   MANDIBLE SURGERY  11/15/12   TONSILLECTOMY AND ADENOIDECTOMY  1/12   TYMPANOSTOMY TUBE PLACEMENT     WISDOM TOOTH EXTRACTION  12/11    Family History  Problem Relation Age of Onset   Cancer Mother        basal cell   Kidney disease Mother        kidney stones   Alcohol abuse Father    Heart attack Father    Gout Father    Allergic Disorder Sister    Alcohol abuse Brother    Depression Brother    Environmental Allergies Brother    Hypothyroidism Maternal Grandmother    Heart disease Maternal Grandfather    Diabetes Maternal Grandfather    Heart attack Maternal Grandfather    Hypertension Maternal  Grandfather    Alzheimer's disease Paternal Grandmother    Parkinson's disease Paternal Grandmother    Scoliosis Paternal Grandmother    Heart disease Paternal Grandfather    Diabetes Paternal Grandfather    Cancer Paternal Grandfather        lymphoma   Asthma Neg Hx     Social History   Occupational History   Not on file  Tobacco Use   Smoking status: Never   Smokeless tobacco: Never  Vaping Use   Vaping Use: Never used  Substance and Sexual Activity   Alcohol use: No    Alcohol/week: 0.0 standard drinks of alcohol    Comment: Less than 1    Drug use: No   Sexual activity: Yes    Partners: Male    Birth control/protection: None    Comment: NuvaRing     Physical Exam: Blood pressure 114/72, pulse 98, temperature 99 F (37.2 C), temperature source Oral, height 5\' 10"  (1.778 m), weight (!) 306 lb 3.2 oz (138.9 kg), SpO2 95 %.  Gen:      No acute distress, occasional dry cougy ENT:  no nasal polyps, mucus membranes moist, +cobblestoning. Lungs:    No increased respiratory effort, symmetric chest wall excursion, clear to auscultation bilaterally, no  wheezes or crackles CV:         Regular rate and rhythm; no murmurs, rubs, or gallops.  No pedal edema   Data Reviewed: Imaging: I have personally reviewed the chest xray June 2023 - no acute cp process but fractured rib noted  PFTs:      No data to display         I have personally reviewed the patient's PFTs and spiro and feno wnl today.   Labs:  Immunization status: Immunization History  Administered Date(s) Administered   Influenza, Quadrivalent, Recombinant, Inj, Pf 10/06/2021   Influenza-Unspecified 09/15/2015, 09/25/2018, 10/20/2019, 10/17/2020   Tdap 09/15/2010, 08/06/2020, 02/23/2021    External Records Personally Reviewed:   Assessment:  Chronic cough, likely multifactorial from rhinitis and gerd  Plan/Recommendations: Failure to improve with prednisone argues highly against asthma as a  diagnosis.  Will obtain spiro and feno. Continue montelukast, claritin and flonase.  Will add daily PPI.   Consider ENT evaluation for vocal cords or neurogenic cough if no improvement.    Return to Care: No follow-ups on file.   Durel Salts, MD Pulmonary and Critical Care Medicine [facility] Office:[phone]

## 2022-08-03 ENCOUNTER — Other Ambulatory Visit (HOSPITAL_COMMUNITY): Payer: Self-pay

## 2022-08-04 ENCOUNTER — Other Ambulatory Visit (HOSPITAL_COMMUNITY): Payer: Self-pay

## 2022-08-11 ENCOUNTER — Other Ambulatory Visit (HOSPITAL_COMMUNITY): Payer: Self-pay

## 2022-08-17 ENCOUNTER — Other Ambulatory Visit (HOSPITAL_COMMUNITY): Payer: Self-pay

## 2022-08-19 ENCOUNTER — Other Ambulatory Visit (HOSPITAL_COMMUNITY): Payer: Self-pay

## 2022-09-02 ENCOUNTER — Other Ambulatory Visit (HOSPITAL_COMMUNITY): Payer: Self-pay

## 2022-09-02 MED ORDER — WEGOVY 0.5 MG/0.5ML ~~LOC~~ SOAJ
SUBCUTANEOUS | 5 refills | Status: AC
  Filled 2022-09-02 – 2023-01-09 (×3): qty 2, 28d supply, fill #0
  Filled 2023-01-30 – 2023-02-03 (×2): qty 2, 28d supply, fill #1
  Filled 2023-03-06: qty 2, 28d supply, fill #2
  Filled 2023-04-19: qty 2, 28d supply, fill #3

## 2022-09-26 ENCOUNTER — Other Ambulatory Visit (HOSPITAL_COMMUNITY): Payer: Self-pay

## 2022-09-28 ENCOUNTER — Other Ambulatory Visit (HOSPITAL_COMMUNITY): Payer: Self-pay

## 2022-09-30 ENCOUNTER — Ambulatory Visit: Payer: No Typology Code available for payment source | Admitting: Internal Medicine

## 2022-09-30 ENCOUNTER — Other Ambulatory Visit (HOSPITAL_COMMUNITY): Payer: Self-pay

## 2022-10-05 ENCOUNTER — Other Ambulatory Visit (HOSPITAL_COMMUNITY): Payer: Self-pay

## 2022-10-06 ENCOUNTER — Other Ambulatory Visit (HOSPITAL_COMMUNITY): Payer: Self-pay

## 2022-10-06 MED ORDER — TECHLITE PEN NEEDLES 32G X 4 MM MISC
4 refills | Status: DC
  Filled 2022-10-06 – 2022-10-21 (×2): qty 100, 90d supply, fill #0

## 2022-10-06 MED ORDER — SAXENDA 18 MG/3ML ~~LOC~~ SOPN
PEN_INJECTOR | SUBCUTANEOUS | 6 refills | Status: DC
  Filled 2022-10-06 – 2022-10-21 (×2): qty 9, 30d supply, fill #0
  Filled 2023-03-20 – 2023-04-19 (×2): qty 9, 30d supply, fill #1

## 2022-10-21 ENCOUNTER — Other Ambulatory Visit (HOSPITAL_COMMUNITY): Payer: Self-pay

## 2022-10-21 MED ORDER — METFORMIN HCL ER 500 MG PO TB24
500.0000 mg | ORAL_TABLET | Freq: Two times a day (BID) | ORAL | 6 refills | Status: DC
  Filled 2022-10-21: qty 60, 30d supply, fill #0
  Filled 2023-01-16: qty 60, 30d supply, fill #1
  Filled 2023-02-22: qty 60, 30d supply, fill #2
  Filled 2023-04-11: qty 60, 30d supply, fill #3

## 2022-11-11 ENCOUNTER — Other Ambulatory Visit (HOSPITAL_COMMUNITY): Payer: Self-pay

## 2022-11-11 MED ORDER — LEVOTHYROXINE SODIUM 75 MCG PO TABS
75.0000 ug | ORAL_TABLET | Freq: Every day | ORAL | 6 refills | Status: DC
  Filled 2022-11-11: qty 90, 90d supply, fill #0
  Filled 2023-02-22: qty 90, 90d supply, fill #1
  Filled 2023-06-15: qty 30, 30d supply, fill #2

## 2022-11-22 ENCOUNTER — Other Ambulatory Visit (HOSPITAL_COMMUNITY): Payer: Self-pay

## 2022-12-21 ENCOUNTER — Other Ambulatory Visit (HOSPITAL_COMMUNITY): Payer: Self-pay

## 2022-12-21 ENCOUNTER — Other Ambulatory Visit: Payer: Self-pay | Admitting: Neurosurgery

## 2022-12-21 DIAGNOSIS — M47817 Spondylosis without myelopathy or radiculopathy, lumbosacral region: Secondary | ICD-10-CM

## 2022-12-21 MED ORDER — GUAIFENESIN-CODEINE 100-10 MG/5ML PO SOLN
5.0000 mL | Freq: Four times a day (QID) | ORAL | 0 refills | Status: DC | PRN
  Filled 2022-12-21: qty 140, 7d supply, fill #0

## 2022-12-21 MED ORDER — AMOXICILLIN-POT CLAVULANATE 875-125 MG PO TABS
1.0000 | ORAL_TABLET | Freq: Two times a day (BID) | ORAL | 0 refills | Status: DC
  Filled 2022-12-21: qty 14, 7d supply, fill #0

## 2022-12-23 ENCOUNTER — Other Ambulatory Visit (HOSPITAL_COMMUNITY): Payer: Self-pay

## 2022-12-23 MED ORDER — NEOMYCIN-POLYMYXIN-HC 3.5-10000-1 OT SUSP
OTIC | 0 refills | Status: DC
  Filled 2022-12-23: qty 10, 7d supply, fill #0

## 2023-01-09 ENCOUNTER — Other Ambulatory Visit (HOSPITAL_COMMUNITY): Payer: Self-pay

## 2023-01-11 ENCOUNTER — Other Ambulatory Visit: Payer: Self-pay

## 2023-01-13 ENCOUNTER — Other Ambulatory Visit (HOSPITAL_COMMUNITY): Payer: Self-pay

## 2023-01-16 ENCOUNTER — Other Ambulatory Visit (HOSPITAL_COMMUNITY): Payer: Self-pay

## 2023-01-17 ENCOUNTER — Other Ambulatory Visit: Payer: Self-pay

## 2023-01-19 DIAGNOSIS — G4700 Insomnia, unspecified: Secondary | ICD-10-CM | POA: Diagnosis not present

## 2023-01-19 DIAGNOSIS — L659 Nonscarring hair loss, unspecified: Secondary | ICD-10-CM | POA: Diagnosis not present

## 2023-01-19 DIAGNOSIS — R5383 Other fatigue: Secondary | ICD-10-CM | POA: Diagnosis not present

## 2023-01-27 DIAGNOSIS — E039 Hypothyroidism, unspecified: Secondary | ICD-10-CM | POA: Diagnosis not present

## 2023-01-27 DIAGNOSIS — R7303 Prediabetes: Secondary | ICD-10-CM | POA: Diagnosis not present

## 2023-01-27 DIAGNOSIS — R7301 Impaired fasting glucose: Secondary | ICD-10-CM | POA: Diagnosis not present

## 2023-01-31 ENCOUNTER — Other Ambulatory Visit: Payer: Self-pay

## 2023-02-03 ENCOUNTER — Other Ambulatory Visit: Payer: Self-pay | Admitting: Endocrinology

## 2023-02-03 DIAGNOSIS — E669 Obesity, unspecified: Secondary | ICD-10-CM | POA: Diagnosis not present

## 2023-02-03 DIAGNOSIS — E063 Autoimmune thyroiditis: Secondary | ICD-10-CM | POA: Diagnosis not present

## 2023-02-03 DIAGNOSIS — E039 Hypothyroidism, unspecified: Secondary | ICD-10-CM | POA: Diagnosis not present

## 2023-02-03 DIAGNOSIS — E010 Iodine-deficiency related diffuse (endemic) goiter: Secondary | ICD-10-CM

## 2023-02-03 DIAGNOSIS — R7301 Impaired fasting glucose: Secondary | ICD-10-CM | POA: Diagnosis not present

## 2023-02-06 ENCOUNTER — Other Ambulatory Visit: Payer: Self-pay

## 2023-02-09 ENCOUNTER — Other Ambulatory Visit (HOSPITAL_COMMUNITY): Payer: Self-pay

## 2023-02-14 ENCOUNTER — Other Ambulatory Visit (HOSPITAL_COMMUNITY): Payer: Self-pay

## 2023-02-14 DIAGNOSIS — J3489 Other specified disorders of nose and nasal sinuses: Secondary | ICD-10-CM | POA: Diagnosis not present

## 2023-02-14 DIAGNOSIS — R053 Chronic cough: Secondary | ICD-10-CM | POA: Diagnosis not present

## 2023-02-14 DIAGNOSIS — R0989 Other specified symptoms and signs involving the circulatory and respiratory systems: Secondary | ICD-10-CM | POA: Diagnosis not present

## 2023-02-14 MED ORDER — PREDNISONE 10 MG PO TABS
ORAL_TABLET | ORAL | 0 refills | Status: AC
  Filled 2023-02-14: qty 21, 6d supply, fill #0

## 2023-02-14 MED ORDER — AZITHROMYCIN 250 MG PO TABS
ORAL_TABLET | ORAL | 0 refills | Status: DC
Start: 2023-02-14 — End: 2024-04-01
  Filled 2023-02-14: qty 6, 5d supply, fill #0

## 2023-02-14 MED ORDER — BREZTRI AEROSPHERE 160-9-4.8 MCG/ACT IN AERO
2.0000 | INHALATION_SPRAY | Freq: Two times a day (BID) | RESPIRATORY_TRACT | 1 refills | Status: DC
  Filled 2023-02-14: qty 10.7, 30d supply, fill #0

## 2023-02-22 ENCOUNTER — Other Ambulatory Visit (HOSPITAL_COMMUNITY): Payer: Self-pay

## 2023-02-22 MED ORDER — WEGOVY 1 MG/0.5ML ~~LOC~~ SOAJ
1.0000 mg | SUBCUTANEOUS | 3 refills | Status: DC
  Filled 2023-02-22 – 2023-02-23 (×2): qty 2, 28d supply, fill #0

## 2023-02-23 ENCOUNTER — Other Ambulatory Visit (HOSPITAL_COMMUNITY): Payer: Self-pay

## 2023-03-24 ENCOUNTER — Other Ambulatory Visit: Payer: Self-pay

## 2023-03-28 ENCOUNTER — Other Ambulatory Visit: Payer: Self-pay

## 2023-03-30 ENCOUNTER — Other Ambulatory Visit (HOSPITAL_COMMUNITY): Payer: Self-pay

## 2023-04-21 ENCOUNTER — Other Ambulatory Visit: Payer: Self-pay

## 2023-04-21 ENCOUNTER — Other Ambulatory Visit (HOSPITAL_COMMUNITY): Payer: Self-pay

## 2023-04-24 ENCOUNTER — Other Ambulatory Visit (HOSPITAL_COMMUNITY): Payer: Self-pay

## 2023-04-27 ENCOUNTER — Other Ambulatory Visit (HOSPITAL_COMMUNITY): Payer: Self-pay

## 2023-04-27 ENCOUNTER — Other Ambulatory Visit: Payer: Self-pay

## 2023-04-27 MED ORDER — PSEUDOEPH-BROMPHEN-DM 30-2-10 MG/5ML PO SYRP
5.0000 mL | ORAL_SOLUTION | Freq: Three times a day (TID) | ORAL | 0 refills | Status: DC
  Filled 2023-04-27: qty 75, 5d supply, fill #0

## 2023-04-27 MED ORDER — CEPHALEXIN 500 MG PO CAPS
500.0000 mg | ORAL_CAPSULE | Freq: Three times a day (TID) | ORAL | 0 refills | Status: DC
  Filled 2023-04-27: qty 15, 5d supply, fill #0

## 2023-05-01 ENCOUNTER — Ambulatory Visit
Admission: RE | Admit: 2023-05-01 | Discharge: 2023-05-01 | Disposition: A | Discharge status: Home / Self Care | Payer: 59 | Source: Ambulatory Visit | Attending: Endocrinology | Admitting: Endocrinology

## 2023-05-01 DIAGNOSIS — E010 Iodine-deficiency related diffuse (endemic) goiter: Secondary | ICD-10-CM | POA: Diagnosis not present

## 2023-05-08 DIAGNOSIS — E039 Hypothyroidism, unspecified: Secondary | ICD-10-CM | POA: Diagnosis not present

## 2023-05-08 DIAGNOSIS — Z0000 Encounter for general adult medical examination without abnormal findings: Secondary | ICD-10-CM | POA: Diagnosis not present

## 2023-05-08 DIAGNOSIS — E669 Obesity, unspecified: Secondary | ICD-10-CM | POA: Diagnosis not present

## 2023-05-08 DIAGNOSIS — E7800 Pure hypercholesterolemia, unspecified: Secondary | ICD-10-CM | POA: Diagnosis not present

## 2023-05-08 DIAGNOSIS — R7303 Prediabetes: Secondary | ICD-10-CM | POA: Diagnosis not present

## 2023-05-15 ENCOUNTER — Other Ambulatory Visit (HOSPITAL_COMMUNITY): Payer: Self-pay

## 2023-05-15 DIAGNOSIS — E063 Autoimmune thyroiditis: Secondary | ICD-10-CM | POA: Diagnosis not present

## 2023-05-15 DIAGNOSIS — R7303 Prediabetes: Secondary | ICD-10-CM | POA: Diagnosis not present

## 2023-05-15 DIAGNOSIS — F53 Postpartum depression: Secondary | ICD-10-CM | POA: Diagnosis not present

## 2023-05-15 DIAGNOSIS — Z0000 Encounter for general adult medical examination without abnormal findings: Secondary | ICD-10-CM | POA: Diagnosis not present

## 2023-05-15 MED ORDER — TRINTELLIX 10 MG PO TABS
10.0000 mg | ORAL_TABLET | Freq: Every day | ORAL | 1 refills | Status: DC
  Filled 2023-05-15: qty 30, 30d supply, fill #0
  Filled 2023-06-15: qty 30, 30d supply, fill #1

## 2023-05-15 MED ORDER — BUPROPION HCL ER (XL) 300 MG PO TB24: 300.0000 mg | ORAL_TABLET | Freq: Every morning | ORAL | 3 refills | Status: DC

## 2023-05-23 ENCOUNTER — Other Ambulatory Visit (HOSPITAL_COMMUNITY): Payer: Self-pay

## 2023-05-23 DIAGNOSIS — E669 Obesity, unspecified: Secondary | ICD-10-CM | POA: Diagnosis not present

## 2023-05-23 DIAGNOSIS — R7301 Impaired fasting glucose: Secondary | ICD-10-CM | POA: Diagnosis not present

## 2023-05-23 DIAGNOSIS — E063 Autoimmune thyroiditis: Secondary | ICD-10-CM | POA: Diagnosis not present

## 2023-05-23 DIAGNOSIS — E039 Hypothyroidism, unspecified: Secondary | ICD-10-CM | POA: Diagnosis not present

## 2023-05-23 MED ORDER — LEVOTHYROXINE SODIUM 75 MCG PO TABS
75.0000 ug | ORAL_TABLET | Freq: Every morning | ORAL | 5 refills | Status: DC
  Filled 2023-05-23 – 2023-08-09 (×2): qty 90, 90d supply, fill #0
  Filled 2023-11-07: qty 90, 90d supply, fill #1

## 2023-05-23 MED ORDER — METFORMIN HCL ER 500 MG PO TB24
500.0000 mg | ORAL_TABLET | Freq: Two times a day (BID) | ORAL | 5 refills | Status: AC
  Filled 2023-05-23 – 2023-06-15 (×2): qty 180, 90d supply, fill #0
  Filled 2023-11-07: qty 180, 90d supply, fill #1

## 2023-06-01 ENCOUNTER — Other Ambulatory Visit (HOSPITAL_COMMUNITY): Payer: Self-pay

## 2023-06-15 ENCOUNTER — Other Ambulatory Visit (HOSPITAL_COMMUNITY): Payer: Self-pay

## 2023-06-22 DIAGNOSIS — Z0184 Encounter for antibody response examination: Secondary | ICD-10-CM | POA: Diagnosis not present

## 2023-06-28 ENCOUNTER — Encounter: Payer: Self-pay | Admitting: Internal Medicine

## 2023-07-09 NOTE — Progress Notes (Signed)
NEW PATIENT Date of Service/Encounter:  07/10/23 Referring provider: Linus Galas, NP Primary care provider: Linus Galas, NP  Subjective:  Madison Cooper is a 32 y.o. female with a PMHx of hypothyroidism presenting today for evaluation of chronic rhinitis and cough. History obtained from: chart review and patient.   Chronic cough:  Around 2.5 years ago, when she was pregnant. When she was younger, her mother told her she had asthma that she outgrew but she doesn't remember ever being on inhalers for this. She did get sick with a URI when pregnant and was coughing for months. Cough would come and go intermittently for weeks at a time She did okay until her son went to daycare, her coughing spells did seem to get worse again. She will wheeze on occasion when really sick. Treatments: Breztri 1 puff daily with spacer and will use it until she starts feeling better.  Typically around one week at a time. She hasn't used it since March or April.  She has had albuterol in the past, but doesn't anymore. She doesn't feel albuterol helped so the pulmonologist ordered her to stop it. Montelukast was also prescribed, but not helpful. This current month, her cough occurs a few times per week.  No nighttime cough or awakenings. Not necessarily with activity.   Denies exercise intolerance now, but states she had a lot of SOB with exercise when playing sports when she was younger.  Has needed around 5 rounds of systemic steroids in the past year which help briefly. No prior hospitalizations. She does not smoke, no second hand smoke exposure. Her vaccines are UTD except Covid.  Chronic rhinitis: Symptoms include: post nasal drainage, watery eyes, and itchy eyes  Occurs year-round with seasonal flares in spring Potential triggers: unknown Treatments tried: claritin and/or zyrtec, flonase, benadryl PRN. Feels zyrtec ineffective. Previous allergy testing: no History of reflux/heartburn:   occasionally-2-3 times per week, doesn't take any medications Previous sinus, ear, tonsil, adenoid surgeries: tonsillectomy and adenoidectomy as well as turbinate reduction 10-15 years ago, ear tubes as an infant.  She has seen a [facility] and saw them 3 times and was told to follow-up as needed.  Eczema: flares on her face and her arms, worse in winter.    Chart review:  PCP notes from referral 05/15/23 - chronic colds since Oct 2023, chronic drainage and ear fullness  Other allergy screening: Food allergy: no Medication allergy:  sulfa-rash and hives, latex: rash Hymenoptera allergy: no Urticaria: no History of recurrent infections suggestive of immunodeficency: no Vaccinations are up to date.   Past Medical History: Past Medical History:  Diagnosis Date   Allergy    Anxiety    [facility] 04/15/2021   Asthma    Eczema    Hypothyroidism    Insulin resistance    Recurrent upper respiratory infection (URI)    Spondylisthesis    Thyroid disease    [facility] 04/15/2021   Urticaria    Medication List:  Current Outpatient Medications  Medication Sig Dispense Refill   Budeson-Glycopyrrol-Formoterol (BREZTRI AEROSPHERE) 160-9-4.8 MCG/ACT AERO Inhale 2 puffs into the lungs 2 (two) times daily. (Patient taking differently: Inhale 2 puffs into the lungs 2 (two) times daily as needed.) 10.7 g 1   buPROPion (WELLBUTRIN XL) 300 MG 24 hr tablet Take 1 tablet (300 mg total) by mouth in the morning. 90 tablet 3   levothyroxine (SYNTHROID) 75 MCG tablet Take 1 tablet by mouth once daily in the morning on an empty stomach 30  tablet 6   levothyroxine (SYNTHROID) 75 MCG tablet Take 1 tablet by mouth once daily in the morning on an empty stomach 90 tablet 5   metFORMIN (GLUCOPHAGE-XR) 500 MG 24 hr tablet Take 1 tablet by mouth twice a day 60 tablet 6   metFORMIN (GLUCOPHAGE-XR) 500 MG 24 hr tablet Take 1 tablet (500 mg total) by mouth 2 (two) times daily. 180 tablet 5   acetaminophen  (TYLENOL) 325 MG tablet Take 650 mg by mouth every 6 (six) hours as needed.     albuterol (VENTOLIN HFA) 108 (90 Base) MCG/ACT inhaler Inhale 2 puffs into the lungs every 6 (six) hours as needed for wheezing or shortness of breath (Cough). 18 g 0   amoxicillin-clavulanate (AUGMENTIN) 875-125 MG tablet Take 1 tablet by mouth every 12 (twelve) hours for 7 days 14 tablet 0   azithromycin (ZITHROMAX) 250 MG tablet Take 2 tablets on the first day, then 1 tablet daily for 4 days. 6 tablet 0   brompheniramine-pseudoephedrine-DM (BROMFED DM) 30-2-10 MG/5ML syrup Take 5 mLs by mouth 3 times daily. (Patient not taking: Reported on 07/10/2023) 75 mL 0   cephALEXin (KEFLEX) 500 MG capsule Take 1 capsule (500 mg) by mouth 3 times daily for 5 days (Patient not taking: Reported on 07/10/2023) 15 capsule 0   fluticasone (FLONASE) 50 MCG/ACT nasal spray Place 1 spray into both nostrils daily. Begin by using 2 sprays in each nare daily for 3 to 5 days, then decrease to 1 spray in each nare daily. 32 mL 1   fluticasone-salmeterol (ADVAIR HFA) 115-21 MCG/ACT inhaler Inhale 2 puffs into the lungs twice daily 12 g 1   guaiFENesin-codeine 100-10 MG/5ML syrup Take 5 mLs by mouth every 6 (six) hours as needed for 7 days. 140 mL 0   Insulin Pen Needle (TECHLITE PEN NEEDLES) 32G X 4 MM MISC Use as directed (Patient not taking: Reported on 07/10/2023) 100 each 4   Liraglutide -Weight Management (SAXENDA) 18 MG/3ML SOPN Inject 0.6mg  into the skin daily. Titrate to 1.8mg  as directed once a day (Patient not taking: Reported on 07/10/2023) 9 mL 6   loratadine (CLARITIN) 10 MG tablet Take 1 tablet (10 mg total) by mouth daily. 90 tablet 1   montelukast (SINGULAIR) 10 MG tablet Take 1 tablet (10 mg total) by mouth at bedtime. 30 tablet 5   neomycin-polymyxin-hydrocortisone (CORTISPORIN) 3.5-10000-1 OTIC suspension Place 4 drops into affected ear three times daily for 7 days. 10 mL 0   omeprazole (PRILOSEC) 40 MG capsule Take 1 capsule (40  mg total) by mouth daily. 30 capsule 5   Semaglutide-Weight Management (WEGOVY) 0.25 MG/0.5ML SOAJ Inject 1 pen (0.5 mL) into the skin Once a week 30 days (Patient not taking: Reported on 07/10/2023) 2 mL 1   Semaglutide-Weight Management (WEGOVY) 0.5 MG/0.5ML SOAJ Inject 1 pen (0.5 ML) into the skin once a week 30 days (Patient not taking: Reported on 07/10/2023) 2 mL 5   Semaglutide-Weight Management (WEGOVY) 1 MG/0.5ML SOAJ Inject 1 mg into the skin once a week. 2 mL 3   vortioxetine HBr (TRINTELLIX) 10 MG TABS tablet Take 1 tablet (10 mg total) by mouth daily. (Patient not taking: Reported on 07/10/2023) 30 tablet 1   No current facility-administered medications for this visit.   Known Allergies:  Allergies  Allergen Reactions   Latex Hives, Itching and Rash   Other    Diclofenac Other (See Comments)    Tachypnea, dizziness/lightheadedness    Adhesive [Tape] Rash   Sulfa  Antibiotics Rash   Past Surgical History: Past Surgical History:  Procedure Laterality Date   ADENOIDECTOMY     ANKLE SURGERY Left 2010   FRACTURE SURGERY N/A    [facility] 04/15/2021   MANDIBLE SURGERY  11/15/2012   TONSILLECTOMY     TONSILLECTOMY AND ADENOIDECTOMY  12/2010   TYMPANOSTOMY TUBE PLACEMENT     WISDOM TOOTH EXTRACTION  11/2010   Family History: Family History  Problem Relation Age of Onset   Cancer Mother        basal cell   Kidney disease Mother        kidney stones   Alcohol abuse Father    Heart attack Father    Gout Father    Allergic Disorder Sister    Alcohol abuse Brother    Depression Brother    Environmental Allergies Brother    Hypothyroidism Maternal Grandmother    Heart disease Maternal Grandfather    Diabetes Maternal Grandfather    Heart attack Maternal Grandfather    Hypertension Maternal Grandfather    Alzheimer's disease Paternal Grandmother    Parkinson's disease Paternal Grandmother    Scoliosis Paternal Grandmother    Heart disease Paternal Grandfather     Diabetes Paternal Grandfather    Cancer Paternal Grandfather        lymphoma   Asthma Neg Hx    Social History: Lynnell lives in a house built in [place], no water damage, carpet floors, heat pump heating, central AC, 2 dogs, no smoke exposure, 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   ROS:  All other systems negative except as noted per HPI.  Objective:  Blood pressure 128/70, pulse 85, temperature 98.3 F (36.8 C), temperature source Temporal, resp. rate 17, height 5' 9.5" (1.765 m), weight 292 lb 14.4 oz (132.9 kg), SpO2 97%. Body mass index is 42.63 kg/m. Physical Exam:  General Appearance:  Alert, cooperative, no distress, appears stated age  Head:  Normocephalic, without obvious abnormality, atraumatic  Eyes:  Conjunctiva clear, EOM's intact  Ears Normal Tms bilaterally, normal EACs  Nose: Nares normal,  dried and bloody mucus and hypertrophic turbinates  Throat: Lips, tongue normal; teeth and gums normal, normal posterior oropharynx  Neck: Supple, symmetrical  Lungs:   clear to auscultation bilaterally, Respirations unlabored, no coughing  Heart:  regular rate and rhythm and no murmur, Appears well perfused  Extremities: No edema  Skin: Skin color, texture, turgor normal and no rashes or lesions on visualized portions of skin  Neurologic: No gross deficits     Diagnostics: Spirometry:  Tracings reviewed. Her effort: Good reproducible efforts. FVC: 4.07L FEV1: 3.68L, 96% predicted FEV1/FVC ratio: 0.90  Interpretation: Spirometry consistent with normal pattern.  Please see scanned spirometry results for details.  Skin and Intradermal testing: Adult environmental panel Adequate controls. Testing read and interpreted by me, documented by staff.  Airborne Adult Perc - 07/10/23 0900     Time Antigen Placed 0935    Allergen Manufacturer Waynette Buttery    Location Back    Number of Test 55    1. Control-Buffer 50% Glycerol Negative    2.  Control-Histamine 4+    3. Bahia Negative    4. French Southern Territories Negative    5. Johnson Negative    6. Kentucky Blue Negative    7. Meadow Fescue Negative    8. Perennial Rye Negative    9. Timothy Negative    10. Ragweed Mix Negative    11.  Cocklebur Negative    12. Plantain,  English Negative    13. Baccharis Negative    14. Dog Fennel Negative    15. Russian Thistle Negative    16. Lamb's Quarters Negative    17. Sheep Sorrell Negative    18. Rough Pigweed Negative    19. Marsh Elder, Rough Negative    20. Mugwort, Common Negative    21. Box, Elder Negative    22. Cedar, red Negative    23. Sweet Gum Negative    24. Pecan Pollen Negative    25. Pine Mix Negative    26. Walnut, Black Pollen Negative    27. Red Mulberry Negative    28. Ash Mix Negative    29. Birch Mix Negative    30. Beech American Negative    31. Cottonwood, Guinea-Bissau Negative    32. Hickory, White Negative    33. Maple Mix Negative    34. Oak, Guinea[place] Mix Negative    35. Sycamore Eastern Negative    36. Alternaria Alternata Negative    37. Cladosporium Herbarum Negative    38. Aspergillus Mix Negative    39. Penicillium Mix Negative    40. Bipolaris Sorokiniana (Helminthosporium) Negative    41. Drechslera Spicifera (Curvularia) Negative    42. Mucor Plumbeus Negative    43. Fusarium Moniliforme Negative    44. Aureobasidium Pullulans (pullulara) Negative    45. Rhizopus Oryzae Negative    46. Botrytis Cinera Negative    47. Epicoccum Nigrum Negative    48. Phoma Betae Negative    49. Dust Mite Mix 3+    50. Cat Hair 10,000 BAU/ml Negative    51.  Dog Epithelia Negative    52. Mixed Feathers Negative    53. Horse Epithelia Negative    54. Cockroach, German Negative    55. Tobacco Leaf Negative             Intradermal - 07/10/23 0900     Time Antigen Placed 1610    Allergen Manufacturer Waynette Buttery    Location Arm    Number of Test 15    Control Negative    Bahia Negative    French Southern Territories Negative     Johnson Negative    7 Grass Negative    Ragweed Mix Negative    Weed Mix Negative    Tree Mix Negative    Mold 1 Negative    Mold 2 Negative    Mold 3 Negative    Mold 4 Negative    Cat 3+    Dog 3+    Cockroach Negative             Assessment and Plan  Chronic Rhinitis: determined to be Perennial Allergic based on testing: - allergy testing today: positive to dust mites, intradermals positive to cat - Prevention:  - allergen avoidance when possible - consider allergy shots as long term control of your symptoms by teaching your immune system to be more tolerant of your allergy triggers - Symptom control: - Start Ryaltris 1-2 sprays in each nostril twice a day as needed for nasal congestion/itchy nose-this will replace flonase. - Continue Antihistamine: daily or daily as needed.   -Options include Zyrtec (Cetirizine) 10mg , Claritin (Loratadine) 10mg , Allegra (Fexofenadine) 180mg , or Xyzal (Levocetirinze) 5mg  - Can be purchased over-the-counter if not covered by insurance.  Allergic Conjunctivitis:  - Consider Allergy Eye drops-great options include Pataday (Olopatadine) or Zaditor (ketotifen) for eye symptoms daily as needed-both sold over the counter  if not covered by insurance.  -Avoid eye drops that say red eye relief as they may contain medications that dry out your eyes.  Chronic cough-suspect cough variant asthma - your lung testing today looked good today - During respiratory illness or coughing flares: Start Breztri  2 puffs  twice daily and continue for 2 weeks or until symptoms resolve.  - use with spacer  - rinse mouth after use. - Rescue Inhaler: Airsupra 2 puffs. Use  every 4-6 hours as needed for chest tightness, wheezing, or coughing.  Can also use 15 minutes prior to exercise if you have symptoms with activity. (Maximum 12 puffs/day) - Asthma is not controlled if:  - Symptoms are occurring >2 times a week OR  - >2 times a month nighttime awakenings  - You  are requiring systemic steroids (prednisone/steroid injections) more than once per year  - Your require hospitalization for your asthma.  - Please call the clinic to schedule a follow up if these symptoms arise  Atopic Dermatitis: flexural Daily Care For Maintenance (daily and continue even once eczema controlled) - Use hypoallergenic hydrating ointment at least twice daily.  This must be done daily for control of flares. (Great options include Vaseline, CeraVe, Aquaphor, Aveeno, Cetaphil, VaniCream, etc) - Avoid detergents, soaps or lotions with fragrances/dyes - Limit showers/baths to 5 minutes and use luke warm water instead of hot, pat dry following baths, and apply moisturizer - can use steroid/non-steroid therapy creams as detailed below up to twice weekly for prevention of flares.  For Flares:(add this to maintenance therapy if needed for flares) First apply steroid/non-steroid treatment creams. Wait 5 minutes then apply moisturizer.  - Triamcinolone 0.1% to body for moderate flares-apply topically twice daily to red, raised areas of skin, followed by moisturizer. Do NOT use on face, groin or armpits. - Hydrocortisone 2.5% to face/body-apply topically twice daily to red, raised areas of skin, followed by moisturizer  Reflux: not controlled, could be contributing to cough and rhinitis - lifestyle and diet modifications (see below) - start omeprazole 40 mg daily for 6 weeks, take 30 minutes prior to morning meals  Follow up : 8-10 weeks, sooner if needed It was a pleasure meeting you in clinic today! Thank you for allowing me to participate in your care.  This note in its entirety was forwarded to the Provider who requested this consultation.  Thank you for your kind referral. I appreciate the opportunity to take part in Brandyce's care. Please do not hesitate to contact me with questions.  Sincerely,  Tonny Bollman, MD [facility]

## 2023-07-10 ENCOUNTER — Other Ambulatory Visit: Payer: Self-pay

## 2023-07-10 ENCOUNTER — Encounter: Payer: Self-pay | Admitting: Internal Medicine

## 2023-07-10 ENCOUNTER — Ambulatory Visit: Payer: 59 | Admitting: Internal Medicine

## 2023-07-10 ENCOUNTER — Other Ambulatory Visit (HOSPITAL_COMMUNITY): Payer: Self-pay

## 2023-07-10 VITALS — BP 128/70 | HR 85 | Temp 98.3°F | Resp 17 | Ht 69.5 in | Wt 292.9 lb

## 2023-07-10 DIAGNOSIS — J3089 Other allergic rhinitis: Secondary | ICD-10-CM

## 2023-07-10 DIAGNOSIS — J45991 Cough variant asthma: Secondary | ICD-10-CM | POA: Diagnosis not present

## 2023-07-10 DIAGNOSIS — K219 Gastro-esophageal reflux disease without esophagitis: Secondary | ICD-10-CM | POA: Diagnosis not present

## 2023-07-10 DIAGNOSIS — R053 Chronic cough: Secondary | ICD-10-CM

## 2023-07-10 DIAGNOSIS — L2082 Flexural eczema: Secondary | ICD-10-CM | POA: Diagnosis not present

## 2023-07-10 MED ORDER — HYDROCORTISONE 2.5 % EX OINT
TOPICAL_OINTMENT | Freq: Two times a day (BID) | CUTANEOUS | 5 refills | Status: AC
  Filled 2023-07-10: qty 20, 30d supply, fill #0

## 2023-07-10 MED ORDER — RYALTRIS 665-25 MCG/ACT NA SUSP: 1.0000 | Freq: Two times a day (BID) | NASAL | 5 refills | Status: DC | PRN

## 2023-07-10 MED ORDER — AIRSUPRA 90-80 MCG/ACT IN AERO
2.0000 | INHALATION_SPRAY | RESPIRATORY_TRACT | 1 refills | Status: DC | PRN
  Filled 2023-07-10: qty 10.7, 30d supply, fill #0
  Filled 2023-11-07: qty 10.7, 10d supply, fill #0
  Filled 2024-03-18: qty 10.7, 10d supply, fill #1

## 2023-07-10 MED ORDER — TRIAMCINOLONE ACETONIDE 0.1 % EX OINT
TOPICAL_OINTMENT | CUTANEOUS | 5 refills | Status: AC
  Filled 2023-07-10: qty 30, 10d supply, fill #0
  Filled 2024-03-18: qty 30, 15d supply, fill #0

## 2023-07-10 MED ORDER — OMEPRAZOLE 40 MG PO CPDR
40.0000 mg | DELAYED_RELEASE_CAPSULE | Freq: Every day | ORAL | 1 refills | Status: DC
Start: 2023-07-10 — End: 2023-10-02
  Filled 2023-07-10: qty 30, 30d supply, fill #0

## 2023-07-10 MED ORDER — BREZTRI AEROSPHERE 160-9-4.8 MCG/ACT IN AERO
INHALATION_SPRAY | RESPIRATORY_TRACT | 1 refills | Status: DC
  Filled 2023-07-10 – 2024-03-18 (×2): qty 10.7, 30d supply, fill #0

## 2023-07-10 NOTE — Patient Instructions (Signed)
Chronic Rhinitis: determined to be Perennial Allergic: - allergy testing today: positive to dust mites, intradermals positive to cat - Prevention:  - allergen avoidance when possible - consider allergy shots as long term control of your symptoms by teaching your immune system to be more tolerant of your allergy triggers - Symptom control: - Start Ryaltris 1-2 sprays in each nostril twice a day as needed for nasal congestion/itchy nose-this will replace flonase. - Continue Antihistamine: daily or daily as needed.   -Options include Zyrtec (Cetirizine) 10mg , Claritin (Loratadine) 10mg , Allegra (Fexofenadine) 180mg , or Xyzal (Levocetirinze) 5mg  - Can be purchased over-the-counter if not covered by insurance.  Allergic Conjunctivitis:  - Consider Allergy Eye drops-great options include Pataday (Olopatadine) or Zaditor (ketotifen) for eye symptoms daily as needed-both sold over the counter if not covered by insurance.  -Avoid eye drops that say red eye relief as they may contain medications that dry out your eyes.  Chronic cough-suspect cough variant asthma - your lung testing today looked good today - During respiratory illness or coughing flares: Start Breztri  2 puffs  twice daily and continue for 2 weeks or until symptoms resolve.  - use with spacer  - rinse mouth after use. - Rescue Inhaler: Airsupra 2 puffs. Use  every 4-6 hours as needed for chest tightness, wheezing, or coughing.  Can also use 15 minutes prior to exercise if you have symptoms with activity. (Maximum 12 puffs/day) - Asthma is not controlled if:  - Symptoms are occurring >2 times a week OR  - >2 times a month nighttime awakenings  - You are requiring systemic steroids (prednisone/steroid injections) more than once per year  - Your require hospitalization for your asthma.  - Please call the clinic to schedule a follow up if these symptoms arise  Atopic Dermatitis:  Daily Care For Maintenance (daily and continue even once  eczema controlled) - Use hypoallergenic hydrating ointment at least twice daily.  This must be done daily for control of flares. (Great options include Vaseline, CeraVe, Aquaphor, Aveeno, Cetaphil, VaniCream, etc) - Avoid detergents, soaps or lotions with fragrances/dyes - Limit showers/baths to 5 minutes and use luke warm water instead of hot, pat dry following baths, and apply moisturizer - can use steroid/non-steroid therapy creams as detailed below up to twice weekly for prevention of flares.  For Flares:(add this to maintenance therapy if needed for flares) First apply steroid/non-steroid treatment creams. Wait 5 minutes then apply moisturizer.  - Triamcinolone 0.1% to body for moderate flares-apply topically twice daily to red, raised areas of skin, followed by moisturizer. Do NOT use on face, groin or armpits. - Hydrocortisone 2.5% to face/body-apply topically twice daily to red, raised areas of skin, followed by moisturizer  Reflux:  - lifestyle and diet modifications (see below) - start omeprazole 40 mg daily for 6 weeks, take 30 minutes prior to morning meals  Follow up : 8-10 weeks, sooner if needed It was a pleasure meeting you in clinic today! Thank you for allowing me to participate in your care.  Tonny Bollman, MD [facility]  DUST MITE AVOIDANCE MEASURES:  There are three main measures that need and can be taken to avoid house dust mites:  Reduce accumulation of dust in general -reduce furniture, clothing, carpeting, books, stuffed animals, especially in bedroom  Separate yourself from the dust -use pillow and mattress encasements (can be found at stores such as [employer] or online) -avoid direct exposure to air condition flow -use  a HEPA filter device, especially in the bedroom; you can also use a HEPA filter vacuum cleaner -wipe dust with a moist towel instead of a dry towel or broom when cleaning  Decrease mites and/or their  secretions -wash clothing and linen and stuffed animals at highest temperature possible, at least every 2 weeks -stuffed animals can also be placed in a bag and put in a freezer overnight  Despite the above measures, it is impossible to eliminate dust mites or their allergen completely from your home.  With the above measures the burden of mites in your home can be diminished, with the goal of minimizing your allergic symptoms.  Success will be reached only when implementing and using all means together. Control of Dog or Cat Allergen  Avoidance is the best way to manage a dog or cat allergy. If you have a dog or cat and are allergic to dog or cats, consider removing the dog or cat from the home. If you have a dog or cat but don't want to find it a new home, or if your family wants a pet even though someone in the household is allergic, here are some strategies that may help keep symptoms at bay:  Keep the pet out of your bedroom and restrict it to only a few rooms. Be advised that keeping the dog or cat in only one room will not limit the allergens to that room. Don't pet, hug or kiss the dog or cat; if you do, wash your hands with soap and water. High-efficiency particulate air (HEPA) cleaners run continuously in a bedroom or living room can reduce allergen levels over time. Regular use of a high-efficiency vacuum cleaner or a central vacuum can reduce allergen levels. Giving your dog or cat a bath at least once a week can reduce airborne allergen.

## 2023-07-18 ENCOUNTER — Other Ambulatory Visit (HOSPITAL_COMMUNITY): Payer: Self-pay

## 2023-08-09 ENCOUNTER — Other Ambulatory Visit (HOSPITAL_COMMUNITY): Payer: Self-pay

## 2023-08-09 ENCOUNTER — Other Ambulatory Visit: Payer: Self-pay

## 2023-09-04 ENCOUNTER — Ambulatory Visit: Payer: 59 | Admitting: Internal Medicine

## 2023-09-06 ENCOUNTER — Other Ambulatory Visit: Payer: Self-pay

## 2023-09-06 ENCOUNTER — Other Ambulatory Visit (HOSPITAL_COMMUNITY): Payer: Self-pay

## 2023-09-06 DIAGNOSIS — L089 Local infection of the skin and subcutaneous tissue, unspecified: Secondary | ICD-10-CM | POA: Diagnosis not present

## 2023-09-06 DIAGNOSIS — J0100 Acute maxillary sinusitis, unspecified: Secondary | ICD-10-CM | POA: Diagnosis not present

## 2023-09-06 MED ORDER — CEFPODOXIME PROXETIL 200 MG PO TABS
200.0000 mg | ORAL_TABLET | Freq: Two times a day (BID) | ORAL | 0 refills | Status: DC
Start: 2023-09-06 — End: 2023-10-02
  Filled 2023-09-06: qty 14, 7d supply, fill #0

## 2023-09-07 ENCOUNTER — Other Ambulatory Visit (HOSPITAL_COMMUNITY): Payer: Self-pay

## 2023-10-02 ENCOUNTER — Encounter: Payer: Self-pay | Admitting: Internal Medicine

## 2023-10-02 ENCOUNTER — Ambulatory Visit: Payer: 59 | Admitting: Internal Medicine

## 2023-10-02 ENCOUNTER — Other Ambulatory Visit: Payer: Self-pay

## 2023-10-02 ENCOUNTER — Other Ambulatory Visit (HOSPITAL_COMMUNITY): Payer: Self-pay

## 2023-10-02 VITALS — BP 128/82 | HR 108 | Temp 98.3°F | Wt 301.5 lb

## 2023-10-02 DIAGNOSIS — K219 Gastro-esophageal reflux disease without esophagitis: Secondary | ICD-10-CM

## 2023-10-02 DIAGNOSIS — J45991 Cough variant asthma: Secondary | ICD-10-CM

## 2023-10-02 DIAGNOSIS — J3089 Other allergic rhinitis: Secondary | ICD-10-CM

## 2023-10-02 DIAGNOSIS — H1013 Acute atopic conjunctivitis, bilateral: Secondary | ICD-10-CM | POA: Insufficient documentation

## 2023-10-02 MED ORDER — AZELASTINE-FLUTICASONE 137-50 MCG/ACT NA SUSP
1.0000 | Freq: Two times a day (BID) | NASAL | 5 refills | Status: DC | PRN
Start: 2023-10-02 — End: 2023-10-11
  Filled 2023-10-02 – 2023-10-03 (×3): qty 23, 30d supply, fill #0

## 2023-10-02 NOTE — Progress Notes (Signed)
FOLLOW UP Date of Service/Encounter:  10/02/23  Subjective:  Madison Cooper (DOB: [birth year]) is a 32 y.o. female who [facility] in re-evaluation of the following: chronic cough concerning for asthma, perennial allergic rhinitis, eczema, reflux History obtained from: chart review and patient.  For Review, LV was on 07/10/23  with Dr.Rosslyn Pasion seen for intial visit for chronic cough and rhinitis . See below for summary of history and diagnostics.   Therapeutic plans/changes recommended: started Breztri 2 puffs BID during respiratory flares, airsupra PRN, ryaltris PRN, triamcionlone and HCT 2.5% BID PRN, omeprazole 40 mg daily x 6 weeks. ----------------------------------------------------- Pertinent History/Diagnostics:  Chronic cough: Started as an adult, 2 and half years ago when pregnant.  Told she had childhood asthma but does not remember being on inhalers.  Cough is intermittent for weeks and time, worsened when her son went to daycare.  Occasionally wheezing when sick.  Denies exercise intolerance currently but did have significant intolerance as a child. 5 rounds of OCS in the past year which have been helpful. No prior hospitalizations. No first or secondhand smoke.  Up-to-date with vaccines except for COVID. Treatments tried: Breztri 1 puff daily with spacer during respiratory illness only, montelukast deemed not helpful, albuterol not helpful -Normal spirometry (07/10/2023): ratio 0.90, 96% FEV1  Current Meds: Breztri 2 puffs twice daily x 2-week during respiratory flares, Airsupra as needed for rescue Allergic Rhinitis:  Postnasal drainage, watery eyes and itchy eyes, perennial with seasonal flares in spring.  Reports reflux 2-3 times a week, not on medications.  Tonsillectomy and adenoidectomy as well as turbinate reduction 10 to 15 years ago.  Ear tubes as an infant. Treatments tried: Claritin, Zyrtec, Flonase, Benadryl.  Feels Zyrtec  and effective. Current meds: Ryaltris, over-the-counter nonsedating antihistamine, allergy eyedrops - SPT environmental panel (07/10/2023): Positive dust mites, intradermal's positive to cat Eczema: Flares on face and arms, worse in winter. Current meds: Triamcinolone and hydrocortisone 2.5% Reflux: At initial visit, started on omeprazole 40 mg daily x 6 weeks --------------------------------------------------- Today presents for follow-up. Discussed the use of AI scribe software for clinical note transcription with the patient, who gave verbal consent to proceed.  History of Present Illness   The patient, with a history of mild asthma and allergies, presents with a lingering cough following two recent upper respiratory infections since her son started daycare in September. The first infection required antibiotic treatment, while the second did not. The patient has been using a prescribed inhaler, Breztri, which has helped to some extent, but the cough persists intermittently. She also used an albuterol inhaler during the first infection, which was more severe. The patient has a [employer], which she has not yet needed to refill. She reports that while the [employer] does not stop the coughing, it alleviates the irritation. The patient experiences occasional shortness of breath, which she attributes to the constant coughing. She also reports wheezing during the peak of her illness, but not currently.  In addition to the cough, the patient experiences allergy symptoms, including a 'poppy' feeling in her ears, runny nose, postnasal drip, and itchy eyes. She takes antihistamines daily, sometimes twice a day when symptoms are severe. The patient was previously on Flonase (ineffective) and switched to Ryaltris nasal spray, but the latter was not covered by her insurance. She is considering allergy shots in the future.  The patient also has a history of reflux, for which she was prescribed  omeprazole for six  weeks. Her symptoms have improved significantly, and she now only experiences reflux 'once in a blue moon.' She believes her reflux is more stress-related than food-related.  Lastly, the patient has a history of eczema, but she has not had any recent flare-ups. She has been prescribed creams, which she has not yet needed to pick up.      All medications reviewed by clinical staff and updated in chart. No new pertinent medical or surgical history except as noted in HPI.  ROS: All others negative except as noted per HPI.   Objective:  BP 128/82   Pulse (!) 108   Temp 98.3 F (36.8 C) (Temporal)   Wt (!) 301 lb 8 oz (136.8 kg)   SpO2 96%   BMI 43.89 kg/m  Body mass index is 43.89 kg/m. Physical Exam: General Appearance:  Alert, cooperative, no distress, appears stated age  Head:  Normocephalic, without obvious abnormality, atraumatic  Eyes:  Conjunctiva clear, EOM's intact  Ears EACs normal bilaterally and normal TMs bilaterally  Nose: Nares normal, hypertrophic turbinates, normal mucosa, and no visible anterior polyps  Throat: Lips, tongue normal; teeth and gums normal, normal posterior oropharynx  Neck: Supple, symmetrical  Lungs:   clear to auscultation bilaterally, Respirations unlabored, no coughing  Heart:  regular rate and rhythm and no murmur, Appears well perfused  Extremities: No edema  Skin: Skin color, texture, turgor normal and no rashes or lesions on visualized portions of skin  Neurologic: No gross deficits   Labs:  Lab Orders  No laboratory test(s) ordered today   Spirometry:  Tracings reviewed. Her effort: Good reproducible efforts. FVC: 4.19L FEV1: 3.17L, 83% predicted FEV1/FVC ratio: 0.76 Interpretation: Spirometry consistent with normal pattern.  Please see scanned spirometry results for details.  Assessment/Plan   Perennial Allergic Rhinitis: not at goal Persistent symptoms including itchy eyes, postnasal drip, and ear fullness  despite daily antihistamine use. Ryaltris not covered by insurance. - allergy testing 06/2023: positive to dust mites, intradermals positive to cat - Prevention:  - allergen avoidance when possible - consider allergy shots as long term control of your symptoms by teaching your immune system to be more tolerant of your allergy triggers - Symptom control: - Start Dymista 1 sprays in each nostril twice a day as needed for nasal congestion/itchy nose-this will replace flonase. - Continue Antihistamine: daily or daily as needed.   -Options include Zyrtec (Cetirizine) 10mg , Claritin (Loratadine) 10mg , Allegra (Fexofenadine) 180mg , or Xyzal (Levocetirinze) 5mg  - Can be purchased over-the-counter if not covered by insurance.  Allergic Conjunctivitis: stable - Consider Allergy Eye drops-great options include Pataday (Olopatadine) or Zaditor (ketotifen) for eye symptoms daily as needed-both sold over the counter if not covered by insurance.  -Avoid eye drops that say red eye relief as they may contain medications that dry out your eyes.  Chronic cough-suspect cough variant asthma; not at goal Two episodes since September, likely related to exposure from son's daycare. First episode required antibiotics, second did not. Lingering cough persists, but has improved with use of Breztri. No current wheezing. Spirometry normal. Likely mild asthma exacerbated by infections.  - your lung testing today looked good today - During respiratory illness or coughing flares: Continue Breztri  2 puffs  twice daily and continue for 2 weeks or until symptoms resolve.  - use with spacer  - rinse mouth after use. - Rescue Inhaler: Airsupra 2 puffs. Use  every 4-6 hours as needed for chest tightness, wheezing, or coughing.  Can also use 15 minutes  prior to exercise if you have symptoms with activity. (Maximum 12 puffs/day) - Asthma is not controlled if:  - Symptoms are occurring >2 times a week OR  - >2 times a month  nighttime awakenings  - You are requiring systemic steroids (prednisone/steroid injections) more than once per year  - Your require hospitalization for your asthma.  - Please call the clinic to schedule a follow up if these symptoms arise  Atopic Dermatitis: controlled Daily Care For Maintenance (daily and continue even once eczema controlled) - Use hypoallergenic hydrating ointment at least twice daily.  This must be done daily for control of flares. (Great options include Vaseline, CeraVe, Aquaphor, Aveeno, Cetaphil, VaniCream, etc) - Avoid detergents, soaps or lotions with fragrances/dyes - Limit showers/baths to 5 minutes and use luke warm water instead of hot, pat dry following baths, and apply moisturizer - can use steroid/non-steroid therapy creams as detailed below up to twice weekly for prevention of flares.  For Flares:(add this to maintenance therapy if needed for flares) First apply steroid/non-steroid treatment creams. Wait 5 minutes then apply moisturizer.  - Triamcinolone 0.1% to body for moderate flares-apply topically twice daily to red, raised areas of skin, followed by moisturizer. Do NOT use on face, groin or armpits. - Hydrocortisone 2.5% to face/body-apply topically twice daily to red, raised areas of skin, followed by moisturizer  Reflux: improved Symptoms improved with omeprazole use for six weeks. Currently only occasional symptoms managed with Tums. - lifestyle and diet modifications (see below) - omeprazole 40 mg daily and Tums as needed  Follow up : 6 months, sooner if needed It was a pleasure seeing you again in clinic today! Thank you for allowing me to participate in your care.  Other: none  Tonny Bollman, MD  [facility]

## 2023-10-02 NOTE — Patient Instructions (Addendum)
Perennial Allergic Rhinitis - allergy testing 06/2023: positive to dust mites, intradermals positive to cat - Prevention:  - allergen avoidance when possible - consider allergy shots as long term control of your symptoms by teaching your immune system to be more tolerant of your allergy triggers - Symptom control: - Start  Dymista  1 sprays in each nostril twice a day as needed for nasal congestion/itchy nose-this will replace flonase. - Continue Antihistamine: daily or daily as needed.   -Options include Zyrtec (Cetirizine) 10mg , Claritin (Loratadine) 10mg , Allegra (Fexofenadine) 180mg , or Xyzal (Levocetirinze) 5mg  - Can be purchased over-the-counter if not covered by insurance.  Allergic Conjunctivitis:  - Consider Allergy Eye drops-great options include Pataday (Olopatadine) or Zaditor (ketotifen) for eye symptoms daily as needed-both sold over the counter if not covered by insurance.  -Avoid eye drops that say red eye relief as they may contain medications that dry out your eyes.  Chronic cough-suspect cough variant asthma - your lung testing today looked good today - During respiratory illness or coughing flares:  Continue  Breztri  2 puffs  twice daily and continue for 2 weeks or until symptoms resolve.  - use with spacer  - rinse mouth after use. - Rescue Inhaler: Airsupra 2 puffs. Use  every 4-6 hours as needed for chest tightness, wheezing, or coughing.  Can also use 15 minutes prior to exercise if you have symptoms with activity. (Maximum 12 puffs/day) - Asthma is not controlled if:  - Symptoms are occurring >2 times a week OR  - >2 times a month nighttime awakenings  - You are requiring systemic steroids (prednisone/steroid injections) more than once per year  - Your require hospitalization for your asthma.  - Please call the clinic to schedule a follow up if these symptoms arise  Atopic Dermatitis:  Daily Care For Maintenance (daily and continue even once eczema  controlled) - Use hypoallergenic hydrating ointment at least twice daily.  This must be done daily for control of flares. (Great options include Vaseline, CeraVe, Aquaphor, Aveeno, Cetaphil, VaniCream, etc) - Avoid detergents, soaps or lotions with fragrances/dyes - Limit showers/baths to 5 minutes and use luke warm water instead of hot, pat dry following baths, and apply moisturizer - can use steroid/non-steroid therapy creams as detailed below up to twice weekly for prevention of flares.  For Flares:(add this to maintenance therapy if needed for flares) First apply steroid/non-steroid treatment creams. Wait 5 minutes then apply moisturizer.  - Triamcinolone 0.1% to body for moderate flares-apply topically twice daily to red, raised areas of skin, followed by moisturizer. Do NOT use on face, groin or armpits. - Hydrocortisone 2.5% to face/body-apply topically twice daily to red, raised areas of skin, followed by moisturizer  Reflux:  - lifestyle and diet modifications (see below) - omeprazole 40 mg and Tums daily as needed  Follow up : 6 months, sooner if needed It was a pleasure seeing you again in clinic today! Thank you for allowing me to participate in your care.  Tonny Bollman, MD [facility]  DUST MITE AVOIDANCE MEASURES:  There are three main measures that need and can be taken to avoid house dust mites:  Reduce accumulation of dust in general -reduce furniture, clothing, carpeting, books, stuffed animals, especially in bedroom  Separate yourself from the dust -use pillow and mattress encasements (can be found at stores such as [employer] or online) -avoid direct exposure to air condition flow -use a HEPA filter device, especially in  the bedroom; you can also use a HEPA filter vacuum cleaner -wipe dust with a moist towel instead of a dry towel or broom when cleaning  Decrease mites and/or their secretions -wash clothing and linen and stuffed  animals at highest temperature possible, at least every 2 weeks -stuffed animals can also be placed in a bag and put in a freezer overnight  Despite the above measures, it is impossible to eliminate dust mites or their allergen completely from your home.  With the above measures the burden of mites in your home can be diminished, with the goal of minimizing your allergic symptoms.  Success will be reached only when implementing and using all means together. Control of Dog or Cat Allergen  Avoidance is the best way to manage a dog or cat allergy. If you have a dog or cat and are allergic to dog or cats, consider removing the dog or cat from the home. If you have a dog or cat but don't want to find it a new home, or if your family wants a pet even though someone in the household is allergic, here are some strategies that may help keep symptoms at bay:  Keep the pet out of your bedroom and restrict it to only a few rooms. Be advised that keeping the dog or cat in only one room will not limit the allergens to that room. Don't pet, hug or kiss the dog or cat; if you do, wash your hands with soap and water. High-efficiency particulate air (HEPA) cleaners run continuously in a bedroom or living room can reduce allergen levels over time. Regular use of a high-efficiency vacuum cleaner or a central vacuum can reduce allergen levels. Giving your dog or cat a bath at least once a week can reduce airborne allergen.

## 2023-10-03 ENCOUNTER — Other Ambulatory Visit (HOSPITAL_COMMUNITY): Payer: Self-pay

## 2023-10-05 ENCOUNTER — Telehealth: Payer: Self-pay

## 2023-10-05 ENCOUNTER — Other Ambulatory Visit (HOSPITAL_COMMUNITY): Payer: Self-pay

## 2023-10-05 NOTE — Telephone Encounter (Signed)
*  Asthma/Allergy  Pharmacy Patient Advocate Encounter   Received notification from CoverMyMeds that prior authorization for Azelastine-Fluticasone 137-50MCG/ACT suspension  is required/requested.   Insurance verification completed.   The patient is insured through [facility] .   Per test claim: PA required; PA submitted to [facility] via CoverMyMeds Key/confirmation #/[id number] Status is pending

## 2023-10-11 MED ORDER — AZELASTINE-FLUTICASONE 137-50 MCG/ACT NA SUSP: 1.0000 | Freq: Two times a day (BID) | NASAL | 5 refills | Status: DC | PRN

## 2023-10-11 NOTE — Telephone Encounter (Signed)
Pharmacy Patient Advocate Encounter  Received notification from [facility] that Prior Authorization for Azelastine-Fluticasone 137-50MCG/ACT suspension has been APPROVED from 10-08-2023 to 10-06-2024   PA #/Case ID/Reference #: [id number]

## 2023-10-11 NOTE — Telephone Encounter (Signed)
PHARMACY NOTIFIED.

## 2023-10-13 ENCOUNTER — Other Ambulatory Visit (HOSPITAL_COMMUNITY): Payer: Self-pay

## 2023-11-07 ENCOUNTER — Other Ambulatory Visit: Payer: Self-pay

## 2023-11-27 DIAGNOSIS — R7303 Prediabetes: Secondary | ICD-10-CM | POA: Diagnosis not present

## 2023-11-27 DIAGNOSIS — E669 Obesity, unspecified: Secondary | ICD-10-CM | POA: Diagnosis not present

## 2023-11-27 DIAGNOSIS — E7800 Pure hypercholesterolemia, unspecified: Secondary | ICD-10-CM | POA: Diagnosis not present

## 2023-11-27 DIAGNOSIS — Z0000 Encounter for general adult medical examination without abnormal findings: Secondary | ICD-10-CM | POA: Diagnosis not present

## 2023-11-27 DIAGNOSIS — E039 Hypothyroidism, unspecified: Secondary | ICD-10-CM | POA: Diagnosis not present

## 2023-12-04 ENCOUNTER — Other Ambulatory Visit (HOSPITAL_COMMUNITY): Payer: Self-pay

## 2023-12-04 DIAGNOSIS — E669 Obesity, unspecified: Secondary | ICD-10-CM | POA: Diagnosis not present

## 2023-12-04 DIAGNOSIS — R7301 Impaired fasting glucose: Secondary | ICD-10-CM | POA: Diagnosis not present

## 2023-12-04 DIAGNOSIS — E063 Autoimmune thyroiditis: Secondary | ICD-10-CM | POA: Diagnosis not present

## 2023-12-04 DIAGNOSIS — E039 Hypothyroidism, unspecified: Secondary | ICD-10-CM | POA: Diagnosis not present

## 2023-12-04 MED ORDER — LEVOTHYROXINE SODIUM 50 MCG PO TABS
50.0000 ug | ORAL_TABLET | Freq: Every morning | ORAL | 5 refills | Status: AC
Start: 2023-12-04 — End: ?
  Filled 2023-12-04 – 2024-02-12 (×2): qty 30, 30d supply, fill #0
  Filled 2024-03-18: qty 30, 30d supply, fill #1
  Filled 2024-04-23: qty 30, 30d supply, fill #2
  Filled 2024-06-05: qty 30, 30d supply, fill #3
  Filled 2024-07-09: qty 30, 30d supply, fill #4

## 2023-12-14 ENCOUNTER — Other Ambulatory Visit (HOSPITAL_COMMUNITY): Payer: Self-pay

## 2024-01-15 ENCOUNTER — Telehealth: Payer: Commercial Managed Care - PPO | Admitting: Physician Assistant

## 2024-01-15 DIAGNOSIS — B9689 Other specified bacterial agents as the cause of diseases classified elsewhere: Secondary | ICD-10-CM

## 2024-01-15 DIAGNOSIS — J019 Acute sinusitis, unspecified: Secondary | ICD-10-CM

## 2024-01-15 MED ORDER — AMOXICILLIN-POT CLAVULANATE 875-125 MG PO TABS
1.0000 | ORAL_TABLET | Freq: Two times a day (BID) | ORAL | 0 refills | Status: DC
Start: 2024-01-15 — End: 2024-04-01

## 2024-01-15 NOTE — Progress Notes (Signed)
Virtual Visit Consent   Madison Cooper, you are scheduled for a virtual visit with a [facility] provider today. Just as with appointments in the office, your consent must be obtained to participate. Your consent will be active for this visit and any virtual visit you may have with one of our providers in the next 365 days. If you have a MyChart account, a copy of this consent can be sent to you electronically.  As this is a virtual visit, video technology does not allow for your provider to perform a traditional examination. This may limit your provider's ability to fully assess your condition. If your provider identifies any concerns that need to be evaluated in person or the need to arrange testing (such as labs, EKG, etc.), we will make arrangements to do so. Although advances in technology are sophisticated, we cannot ensure that it will always work on either your end or our end. If the connection with a video visit is poor, the visit may have to be switched to a telephone visit. With either a video or telephone visit, we are not always able to ensure that we have a secure connection.  By engaging in this virtual visit, you consent to the provision of healthcare and authorize for your insurance to be billed (if applicable) for the services provided during this visit. Depending on your insurance coverage, you may receive a charge related to this service.  I need to obtain your verbal consent now. Are you willing to proceed with your visit today? LINETTE OSTERMEYER has provided verbal consent on 01/15/2024 for a virtual visit (video or telephone). Margaretann Loveless, PA-C  Date: 01/15/2024 10:06 AM  Virtual Visit via Video Note   I, Margaretann Loveless, connected with  Madison Cooper  ([id number], 1991-04-03) on 01/15/24 at 10:00 AM EST by a video-enabled telemedicine application and verified that I am speaking with the correct person using two identifiers.  Location: Patient: Virtual Visit  Location Patient: Home Provider: Virtual Visit Location Provider: Home Office   I discussed the limitations of evaluation and management by telemedicine and the availability of in person appointments. The patient expressed understanding and agreed to proceed.    History of Present Illness: Madison Cooper is a 33 y.o. who identifies as a female who was assigned female at birth, and is being seen today for sinus congestion.  HPI: Sinus Problem This is a new problem. The current episode started 1 to 4 weeks ago (Head cold symptoms started around Christmas and has been intermittent since). The problem has been waxing and waning since onset. There has been no fever (possible low grade). The pain is moderate. Associated symptoms include congestion, coughing (does have cough asthma so not sure of cause), ear pain (left), headaches and sinus pressure (left). Pertinent negatives include no chills or sore throat. (Pressure in nose and upper jaw, post nasal drainage) Treatments tried: mucinex, dayquil, nyquil, sudafed. The treatment provided no relief.     Problems:  Patient Active Problem List   Diagnosis Date Noted   Allergic conjunctivitis of both eyes 10/02/2023   Flexural eczema 07/10/2023   Perennial allergic rhinitis 07/10/2023   Cough variant asthma 07/10/2023   Gastroesophageal reflux disease 07/10/2023   Indication for care in labor or delivery 05/15/2021   SVD (spontaneous vaginal delivery) 05/15/2021   Pregnancy 04/16/2021   Irregular periods 04/16/2021   Hypothyroidism 04/16/2021   Hashimoto's thyroiditis 04/16/2021   Eustachian tube dysfunction, left 12/15/2020   Tiredness  04/15/2020   Sleep difficulties 04/15/2020   Motion sickness 07/23/2019   Anxiety and depression 06/24/2019   Contact dermatitis 02/05/2019   Acute hip pain, left 02/05/2019   Healthcare maintenance 01/22/2019   Spondylolisthesis 01/22/2019   BMI 38.0-38.9,adult 01/22/2019    Allergies:  Allergies   Allergen Reactions   Latex Hives, Itching and Rash   Other    Diclofenac Other (See Comments)    Tachypnea, dizziness/lightheadedness    Adhesive [Tape] Rash   Sulfa Antibiotics Rash   Medications:  Current Outpatient Medications:    amoxicillin-clavulanate (AUGMENTIN) 875-125 MG tablet, Take 1 tablet by mouth 2 (two) times daily., Disp: 20 tablet, Rfl: 0   Albuterol-Budesonide (AIRSUPRA) 90-80 MCG/ACT AERO, Inhale 2 puffs into the lungs every 4 (four) hours as needed., Disp: 10.7 g, Rfl: 1   Azelastine-Fluticasone 137-50 MCG/ACT SUSP, Place 1 spray into the nose 2 (two) times daily as needed., Disp: 23 g, Rfl: 5   azithromycin (ZITHROMAX) 250 MG tablet, Take 2 tablets on the first day, then 1 tablet daily for 4 days., Disp: 6 tablet, Rfl: 0   Budeson-Glycopyrrol-Formoterol (BREZTRI AEROSPHERE) 160-9-4.8 MCG/ACT AERO, At onset of respiratory illness/asthma flare: Inhale 2 puffs twice daily with spacer for 2 weeks or until symptoms resolve., Disp: 10.7 g, Rfl: 1   buPROPion (WELLBUTRIN XL) 300 MG 24 hr tablet, Take 1 tablet (300 mg total) by mouth in the morning. (Patient not taking: Reported on 10/02/2023), Disp: 90 tablet, Rfl: 3   hydrocortisone 2.5 % ointment, Apply to body twice daily to red, raised areas of skin, followed by moisturizer. (Patient not taking: Reported on 10/02/2023), Disp: 30 g, Rfl: 5   Insulin Pen Needle (TECHLITE PEN NEEDLES) 32G X 4 MM MISC, Use as directed (Patient not taking: Reported on 07/10/2023), Disp: 100 each, Rfl: 4   levothyroxine (SYNTHROID) 50 MCG tablet, Take 1 tablet (50 mcg total) by mouth in the morning on an empty stomach., Disp: 30 tablet, Rfl: 5   levothyroxine (SYNTHROID) 75 MCG tablet, Take 1 tablet by mouth once daily in the morning on an empty stomach (Patient not taking: Reported on 10/02/2023), Disp: 30 tablet, Rfl: 6   levothyroxine (SYNTHROID) 75 MCG tablet, Take 1 tablet (75 mcg total) by mouth in the morning on an empty sromach., Disp: 90  tablet, Rfl: 5   Liraglutide -Weight Management (SAXENDA) 18 MG/3ML SOPN, Inject 0.6mg  into the skin daily. Titrate to 1.8mg  as directed once a day (Patient not taking: Reported on 07/10/2023), Disp: 9 mL, Rfl: 6   metFORMIN (GLUCOPHAGE-XR) 500 MG 24 hr tablet, Take 1 tablet by mouth twice a day (Patient not taking: Reported on 10/02/2023), Disp: 60 tablet, Rfl: 6   metFORMIN (GLUCOPHAGE-XR) 500 MG 24 hr tablet, Take 1 tablet (500 mg total) by mouth 2 (two) times daily., Disp: 180 tablet, Rfl: 5   naproxen (NAPROSYN) 500 MG tablet, 500 mg 2 (two) times daily with a meal., Disp: , Rfl:    paragard intrauterine copper IUD IUD, 1 each by Intrauterine route., Disp: , Rfl:    Semaglutide-Weight Management (WEGOVY) 0.25 MG/0.5ML SOAJ, Inject 1 pen (0.5 mL) into the skin Once a week 30 days (Patient not taking: Reported on 07/10/2023), Disp: 2 mL, Rfl: 1   Semaglutide-Weight Management (WEGOVY) 0.5 MG/0.5ML SOAJ, Inject 1 pen (0.5 ML) into the skin once a week 30 days (Patient not taking: Reported on 07/10/2023), Disp: 2 mL, Rfl: 5   Semaglutide-Weight Management (WEGOVY) 1 MG/0.5ML SOAJ, Inject 1 mg into the skin  once a week., Disp: 2 mL, Rfl: 3   triamcinolone ointment (KENALOG) 0.1 %, Apply twice daily to red, raised areas of skin, followed by moisturizer. Do NOT use on face, groin or armpits., Disp: 30 g, Rfl: 5   vortioxetine HBr (TRINTELLIX) 10 MG TABS tablet, Take 1 tablet (10 mg total) by mouth daily. (Patient not taking: Reported on 07/10/2023), Disp: 30 tablet, Rfl: 1  Observations/Objective: Patient is well-developed, well-nourished in no acute distress.  Resting comfortably at home.  Head is normocephalic, atraumatic.  No labored breathing.  Speech is clear and coherent with logical content.  Patient is alert and oriented at baseline.    Assessment and Plan: 1. Acute bacterial sinusitis (Primary) - amoxicillin-clavulanate (AUGMENTIN) 875-125 MG tablet; Take 1 tablet by mouth 2 (two) times  daily.  Dispense: 20 tablet; Refill: 0  - Worsening symptoms that have not responded to OTC medications.  - Will give Augmentin - Continue allergy medications.  - Steam and humidifier can help - Stay well hydrated and get plenty of rest.  - Seek in person evaluation if no symptom improvement or if symptoms worsen   Follow Up Instructions: I discussed the assessment and treatment plan with the patient. The patient was provided an opportunity to ask questions and all were answered. The patient agreed with the plan and demonstrated an understanding of the instructions.  A copy of instructions were sent to the patient via MyChart unless otherwise noted below.    The patient was advised to call back or seek an in-person evaluation if the symptoms worsen or if the condition fails to improve as anticipated.    Margaretann Loveless, PA-C

## 2024-01-15 NOTE — Patient Instructions (Signed)
Madison Cooper, thank you for joining Margaretann Loveless, PA-C for today's virtual visit.  While this provider is not your primary care provider (PCP), if your PCP is located in our provider database this encounter information will be shared with them immediately following your visit.   A [facility] MyChart account gives you access to today's visit and all your visits, tests, and labs performed at [facility] " click here if you don't have a [facility] MyChart account or go to [email or web address]  Consent: (Patient) Madison Cooper provided verbal consent for this virtual visit at the beginning of the encounter.  Current Medications:  Current Outpatient Medications:    amoxicillin-clavulanate (AUGMENTIN) 875-125 MG tablet, Take 1 tablet by mouth 2 (two) times daily., Disp: 20 tablet, Rfl: 0   Albuterol-Budesonide (AIRSUPRA) 90-80 MCG/ACT AERO, Inhale 2 puffs into the lungs every 4 (four) hours as needed., Disp: 10.7 g, Rfl: 1   Azelastine-Fluticasone 137-50 MCG/ACT SUSP, Place 1 spray into the nose 2 (two) times daily as needed., Disp: 23 g, Rfl: 5   azithromycin (ZITHROMAX) 250 MG tablet, Take 2 tablets on the first day, then 1 tablet daily for 4 days., Disp: 6 tablet, Rfl: 0   Budeson-Glycopyrrol-Formoterol (BREZTRI AEROSPHERE) 160-9-4.8 MCG/ACT AERO, At onset of respiratory illness/asthma flare: Inhale 2 puffs twice daily with spacer for 2 weeks or until symptoms resolve., Disp: 10.7 g, Rfl: 1   buPROPion (WELLBUTRIN XL) 300 MG 24 hr tablet, Take 1 tablet (300 mg total) by mouth in the morning. (Patient not taking: Reported on 10/02/2023), Disp: 90 tablet, Rfl: 3   hydrocortisone 2.5 % ointment, Apply to body twice daily to red, raised areas of skin, followed by moisturizer. (Patient not taking: Reported on 10/02/2023), Disp: 30 g, Rfl: 5   Insulin Pen Needle (TECHLITE PEN NEEDLES) 32G X 4 MM MISC, Use as directed (Patient not taking: Reported on 07/10/2023), Disp: 100  each, Rfl: 4   levothyroxine (SYNTHROID) 50 MCG tablet, Take 1 tablet (50 mcg total) by mouth in the morning on an empty stomach., Disp: 30 tablet, Rfl: 5   levothyroxine (SYNTHROID) 75 MCG tablet, Take 1 tablet by mouth once daily in the morning on an empty stomach (Patient not taking: Reported on 10/02/2023), Disp: 30 tablet, Rfl: 6   levothyroxine (SYNTHROID) 75 MCG tablet, Take 1 tablet (75 mcg total) by mouth in the morning on an empty sromach., Disp: 90 tablet, Rfl: 5   Liraglutide -Weight Management (SAXENDA) 18 MG/3ML SOPN, Inject 0.6mg  into the skin daily. Titrate to 1.8mg  as directed once a day (Patient not taking: Reported on 07/10/2023), Disp: 9 mL, Rfl: 6   metFORMIN (GLUCOPHAGE-XR) 500 MG 24 hr tablet, Take 1 tablet by mouth twice a day (Patient not taking: Reported on 10/02/2023), Disp: 60 tablet, Rfl: 6   metFORMIN (GLUCOPHAGE-XR) 500 MG 24 hr tablet, Take 1 tablet (500 mg total) by mouth 2 (two) times daily., Disp: 180 tablet, Rfl: 5   naproxen (NAPROSYN) 500 MG tablet, 500 mg 2 (two) times daily with a meal., Disp: , Rfl:    paragard intrauterine copper IUD IUD, 1 each by Intrauterine route., Disp: , Rfl:    Semaglutide-Weight Management (WEGOVY) 0.25 MG/0.5ML SOAJ, Inject 1 pen (0.5 mL) into the skin Once a week 30 days (Patient not taking: Reported on 07/10/2023), Disp: 2 mL, Rfl: 1   Semaglutide-Weight Management (WEGOVY) 0.5 MG/0.5ML SOAJ, Inject 1 pen (0.5 ML) into the skin once a week 30 days (Patient not taking: Reported on  07/10/2023), Disp: 2 mL, Rfl: 5   Semaglutide-Weight Management (WEGOVY) 1 MG/0.5ML SOAJ, Inject 1 mg into the skin once a week., Disp: 2 mL, Rfl: 3   triamcinolone ointment (KENALOG) 0.1 %, Apply twice daily to red, raised areas of skin, followed by moisturizer. Do NOT use on face, groin or armpits., Disp: 30 g, Rfl: 5   vortioxetine HBr (TRINTELLIX) 10 MG TABS tablet, Take 1 tablet (10 mg total) by mouth daily. (Patient not taking: Reported on 07/10/2023), Disp:  30 tablet, Rfl: 1   Medications ordered in this encounter:  Meds ordered this encounter  Medications   amoxicillin-clavulanate (AUGMENTIN) 875-125 MG tablet    Sig: Take 1 tablet by mouth 2 (two) times daily.    Dispense:  20 tablet    Refill:  0    Supervising Provider:   Merrilee Jansky [[id number]]     *If you need refills on other medications prior to your next appointment, please contact your pharmacy*  Follow-Up: Call back or seek an in-person evaluation if the symptoms worsen or if the condition fails to improve as anticipated.  [facility] [phone]  Other Instructions Sinus Infection, Adult A sinus infection, also called sinusitis, is inflammation of your sinuses. Sinuses are hollow spaces in the bones around your face. Your sinuses are located: Around your eyes. In the middle of your forehead. Behind your nose. In your cheekbones. Mucus normally drains out of your sinuses. When your nasal tissues become inflamed or swollen, mucus can become trapped or blocked. This allows bacteria, viruses, and fungi to grow, which leads to infection. Most infections of the sinuses are caused by a virus. A sinus infection can develop quickly. It can last for up to 4 weeks (acute) or for more than 12 weeks (chronic). A sinus infection often develops after a cold. What are the causes? This condition is caused by anything that creates swelling in the sinuses or stops mucus from draining. This includes: Allergies. Asthma. Infection from bacteria or viruses. Deformities or blockages in your nose or sinuses. Abnormal growths in the nose (nasal polyps). Pollutants, such as chemicals or irritants in the air. Infection from fungi. This is rare. What increases the risk? You are more likely to develop this condition if you: Have a weak body defense system (immune system). Do a lot of swimming or diving. Overuse nasal sprays. Smoke. What are the signs or symptoms? The main  symptoms of this condition are pain and a feeling of pressure around the affected sinuses. Other symptoms include: Stuffy nose or congestion that makes it difficult to breathe through your nose. Thick yellow or greenish drainage from your nose. Tenderness, swelling, and warmth over the affected sinuses. A cough that may get worse at night. Decreased sense of smell and taste. Extra mucus that collects in the throat or the back of the nose (postnasal drip) causing a sore throat or bad breath. Tiredness (fatigue). Fever. How is this diagnosed? This condition is diagnosed based on: Your symptoms. Your medical history. A physical exam. Tests to find out if your condition is acute or chronic. This may include: Checking your nose for nasal polyps. Viewing your sinuses using a device that has a light (endoscope). Testing for allergies or bacteria. Imaging tests, such as an MRI or CT scan. In rare cases, a bone biopsy may be done to rule out more serious types of fungal sinus disease. How is this treated? Treatment for a sinus infection depends on the cause and  whether your condition is chronic or acute. If caused by a virus, your symptoms should go away on their own within 10 days. You may be given medicines to relieve symptoms. They include: Medicines that shrink swollen nasal passages (decongestants). A spray that eases inflammation of the nostrils (topical intranasal corticosteroids). Rinses that help get rid of thick mucus in your nose (nasal saline washes). Medicines that treat allergies (antihistamines). Over-the-counter pain relievers. If caused by bacteria, your health care provider may recommend waiting to see if your symptoms improve. Most bacterial infections will get better without antibiotic medicine. You may be given antibiotics if you have: A severe infection. A weak immune system. If caused by narrow nasal passages or nasal polyps, surgery may be needed. Follow these  instructions at home: Medicines Take, use, or apply over-the-counter and prescription medicines only as told by your health care provider. These may include nasal sprays. If you were prescribed an antibiotic medicine, take it as told by your health care provider. Do not stop taking the antibiotic even if you start to feel better. Hydrate and humidify  Drink enough fluid to keep your urine pale yellow. Staying hydrated will help to thin your mucus. Use a cool mist humidifier to keep the humidity level in your home above 50%. Inhale steam for 10-15 minutes, 3-4 times a day, or as told by your health care provider. You can do this in the bathroom while a hot shower is running. Limit your exposure to cool or dry air. Rest Rest as much as possible. Sleep with your head raised (elevated). Make sure you get enough sleep each night. General instructions  Apply a warm, moist washcloth to your face 3-4 times a day or as told by your health care provider. This will help with discomfort. Use nasal saline washes as often as told by your health care provider. Wash your hands often with soap and water to reduce your exposure to germs. If soap and water are not available, use hand sanitizer. Do not smoke. Avoid being around people who are smoking (secondhand smoke). Keep all follow-up visits. This is important. Contact a health care provider if: You have a fever. Your symptoms get worse. Your symptoms do not improve within 10 days. Get help right away if: You have a severe headache. You have persistent vomiting. You have severe pain or swelling around your face or eyes. You have vision problems. You develop confusion. Your neck is stiff. You have trouble breathing. These symptoms may be an emergency. Get help right away. Call 911. Do not wait to see if the symptoms will go away. Do not drive yourself to the hospital. Summary A sinus infection is soreness and inflammation of your sinuses. Sinuses  are hollow spaces in the bones around your face. This condition is caused by nasal tissues that become inflamed or swollen. The swelling traps or blocks the flow of mucus. This allows bacteria, viruses, and fungi to grow, which leads to infection. If you were prescribed an antibiotic medicine, take it as told by your health care provider. Do not stop taking the antibiotic even if you start to feel better. Keep all follow-up visits. This is important. This information is not intended to replace advice given to you by your health care provider. Make sure you discuss any questions you have with your health care provider. Document Revised: 11/16/2021 Document Reviewed: 11/16/2021 Elsevier Patient Education  2024 [employer].   If you have been instructed to have an in-person evaluation today  at a local Urgent Care facility, please use the link below. It will take you to a list of all of our available [facility], including address, phone number and hours of operation. Please do not delay care.  [facility]  If you or a family member do not have a primary care provider, use the link below to schedule a visit and establish care. When you choose a [facility] primary care physician or advanced practice provider, you gain a long-term partner in health. Find a Primary Care Provider  Learn more about [facility] in-office and virtual care options:  - Get Care Now

## 2024-02-01 ENCOUNTER — Other Ambulatory Visit (HOSPITAL_COMMUNITY): Payer: Self-pay

## 2024-02-01 DIAGNOSIS — H6692 Otitis media, unspecified, left ear: Secondary | ICD-10-CM | POA: Diagnosis not present

## 2024-02-01 DIAGNOSIS — R6889 Other general symptoms and signs: Secondary | ICD-10-CM | POA: Diagnosis not present

## 2024-02-01 DIAGNOSIS — J0120 Acute ethmoidal sinusitis, unspecified: Secondary | ICD-10-CM | POA: Diagnosis not present

## 2024-02-01 MED ORDER — DOXYCYCLINE HYCLATE 100 MG PO CAPS
100.0000 mg | ORAL_CAPSULE | Freq: Two times a day (BID) | ORAL | 0 refills | Status: DC
  Filled 2024-02-01: qty 28, 14d supply, fill #0

## 2024-02-05 DIAGNOSIS — E039 Hypothyroidism, unspecified: Secondary | ICD-10-CM | POA: Diagnosis not present

## 2024-02-08 ENCOUNTER — Encounter: Payer: Self-pay | Admitting: Internal Medicine

## 2024-02-12 ENCOUNTER — Other Ambulatory Visit (HOSPITAL_COMMUNITY): Payer: Self-pay

## 2024-02-12 ENCOUNTER — Other Ambulatory Visit: Payer: Self-pay

## 2024-02-12 MED ORDER — PREDNISONE 10 MG PO TABS
ORAL_TABLET | ORAL | 0 refills | Status: AC
  Filled 2024-02-12 (×2): qty 21, 6d supply, fill #0

## 2024-03-19 ENCOUNTER — Other Ambulatory Visit (HOSPITAL_COMMUNITY): Payer: Self-pay

## 2024-04-01 ENCOUNTER — Encounter: Payer: Self-pay | Admitting: Internal Medicine

## 2024-04-01 ENCOUNTER — Other Ambulatory Visit (HOSPITAL_COMMUNITY): Payer: Self-pay

## 2024-04-01 ENCOUNTER — Ambulatory Visit: Payer: Self-pay | Admitting: Internal Medicine

## 2024-04-01 ENCOUNTER — Other Ambulatory Visit: Payer: Self-pay

## 2024-04-01 VITALS — BP 118/74 | HR 92 | Temp 98.5°F | Ht 69.5 in | Wt 312.0 lb

## 2024-04-01 DIAGNOSIS — H1013 Acute atopic conjunctivitis, bilateral: Secondary | ICD-10-CM

## 2024-04-01 DIAGNOSIS — J45991 Cough variant asthma: Secondary | ICD-10-CM

## 2024-04-01 DIAGNOSIS — K219 Gastro-esophageal reflux disease without esophagitis: Secondary | ICD-10-CM

## 2024-04-01 DIAGNOSIS — J3089 Other allergic rhinitis: Secondary | ICD-10-CM | POA: Diagnosis not present

## 2024-04-01 DIAGNOSIS — J329 Chronic sinusitis, unspecified: Secondary | ICD-10-CM | POA: Diagnosis not present

## 2024-04-01 MED ORDER — AZELASTINE HCL 0.1 % NA SOLN
2.0000 | Freq: Two times a day (BID) | NASAL | 5 refills | Status: AC | PRN
  Filled 2024-04-01: qty 30, 25d supply, fill #0
  Filled 2024-08-04: qty 30, 25d supply, fill #1

## 2024-04-01 MED ORDER — IPRATROPIUM BROMIDE 0.03 % NA SOLN
2.0000 | Freq: Three times a day (TID) | NASAL | 12 refills | Status: AC
  Filled 2024-04-01: qty 30, 30d supply, fill #0
  Filled 2024-08-04: qty 30, 30d supply, fill #1

## 2024-04-01 NOTE — Patient Instructions (Addendum)
 Perennial Allergic Rhinitis - allergy testing 06/2023: positive to dust mites, intradermals positive to cat - Prevention:  - allergen avoidance when possible - consider allergy shots as long term control of your symptoms by teaching your immune system to be more tolerant of your allergy triggers - Symptom control: - Continue  Nasacort/Nasonex/Flonase Sensimist  1 sprays in each nostril twice a day as needed for nasal congestion/itchy nose -Add Astelin 1-2 sprays each nostril, twice daily as needed for congestion, itchy ears,  -Add Ipatropium nasal 0.03% 1-2 sprays 2-3 times daily as needed for postnasal drip, runny nose - Continue Antihistamine: daily or daily as needed.   -Options include Zyrtec (Cetirizine) 10mg , Claritin (Loratadine) 10mg , Allegra (Fexofenadine) 180mg , or Xyzal (Levocetirinze) 5mg  - Can be purchased over-the-counter if not covered by insurance.  Allergic Conjunctivitis:  - Consider Allergy Eye drops-great options include Pataday (Olopatadine) or Zaditor (ketotifen) for eye symptoms daily as needed-both sold over the counter if not covered by insurance.  -Avoid eye drops that say red eye relief as they may contain medications that dry out your eyes.  Chronic cough-suspect cough variant asthma - your lung testing today looked good today - During respiratory illness or coughing flares:  Continue  Breztri  2 puffs  twice daily and continue for 2 weeks or until symptoms resolve.  - use with spacer  - rinse mouth after use. - Rescue Inhaler: Airsupra 2 puffs. Use  every 4-6 hours as needed for chest tightness, wheezing, or coughing.  Can also use 15 minutes prior to exercise if you have symptoms with activity. (Maximum 12 puffs/day) - Asthma is not controlled if:  - Symptoms are occurring >2 times a week OR  - >2 times a month nighttime awakenings  - You are requiring systemic steroids (prednisone/steroid injections) more than once per year  - Your require hospitalization for  your asthma.  - Please call the clinic to schedule a follow up if these symptoms arise  Atopic Dermatitis:  Daily Care For Maintenance (daily and continue even once eczema controlled) - Use hypoallergenic hydrating ointment at least twice daily.  This must be done daily for control of flares. (Great options include Vaseline, CeraVe, Aquaphor, Aveeno, Cetaphil, VaniCream, etc) - Avoid detergents, soaps or lotions with fragrances/dyes - Limit showers/baths to 5 minutes and use luke warm water instead of hot, pat dry following baths, and apply moisturizer - can use steroid/non-steroid therapy creams as detailed below up to twice weekly for prevention of flares.  For Flares:(add this to maintenance therapy if needed for flares) First apply steroid/non-steroid treatment creams. Wait 5 minutes then apply moisturizer.  - Triamcinolone 0.1% to body for moderate flares-apply topically twice daily to red, raised areas of skin, followed by moisturizer. Do NOT use on face, groin or armpits. - Hydrocortisone 2.5% to face/body-apply topically twice daily to red, raised areas of skin, followed by moisturizer  Reflux:  - lifestyle and diet modifications (see below) - famotidine 20 mg daily as needed  Recurrent sinus infections:  Overall immune history reassuring, but will check humoral immune labs for reassurance. -humoral immune labs  Follow up : 6 months, sooner if needed It was a pleasure seeing you again in clinic today! Thank you for allowing me to participate in your care.  Tonny Bollman, MD [facility]  DUST MITE AVOIDANCE MEASURES:  There are three main measures that need and can be taken to avoid house dust mites:  Reduce accumulation of dust in general -reduce  furniture, clothing, carpeting, books, stuffed animals, especially in bedroom  Separate yourself from the dust -use pillow and mattress encasements (can be found at stores such as [employer] or  online) -avoid direct exposure to air condition flow -use a HEPA filter device, especially in the bedroom; you can also use a HEPA filter vacuum cleaner -wipe dust with a moist towel instead of a dry towel or broom when cleaning  Decrease mites and/or their secretions -wash clothing and linen and stuffed animals at highest temperature possible, at least every 2 weeks -stuffed animals can also be placed in a bag and put in a freezer overnight  Despite the above measures, it is impossible to eliminate dust mites or their allergen completely from your home.  With the above measures the burden of mites in your home can be diminished, with the goal of minimizing your allergic symptoms.  Success will be reached only when implementing and using all means together. Control of Dog or Cat Allergen  Avoidance is the best way to manage a dog or cat allergy. If you have a dog or cat and are allergic to dog or cats, consider removing the dog or cat from the home. If you have a dog or cat but don't want to find it a new home, or if your family wants a pet even though someone in the household is allergic, here are some strategies that may help keep symptoms at bay:  Keep the pet out of your bedroom and restrict it to only a few rooms. Be advised that keeping the dog or cat in only one room will not limit the allergens to that room. Don't pet, hug or kiss the dog or cat; if you do, wash your hands with soap and water. High-efficiency particulate air (HEPA) cleaners run continuously in a bedroom or living room can reduce allergen levels over time. Regular use of a high-efficiency vacuum cleaner or a central vacuum can reduce allergen levels. Giving your dog or cat a bath at least once a week can reduce airborne allergen.

## 2024-04-01 NOTE — Progress Notes (Signed)
 FOLLOW UP Date of Service/Encounter:  04/01/24  Subjective:  Madison Cooper (DOB: 02-Nov-1991) is a 33 y.o. female who [facility] in re-evaluation of the following: chronic cough concerning for asthma, perennial allergic rhinitis, eczema, reflux  History obtained from: chart review and patient.  For Review, LV was on 10/02/23  with Dr.Doyle Tegethoff seen for routine follow-up. See below for summary of history and diagnostics.   Therapeutic plans/changes recommended: Fev1 83%, started on dymista nasal spray, otherwise doing well. ----------------------------------------------------- Pertinent History/Diagnostics:  Chronic cough: Started as an adult, 2 and half years ago when pregnant.  Told she had childhood asthma but does not remember being on inhalers.  Cough is intermittent for weeks and time, worsened when her son went to daycare.  Occasionally wheezing when sick.  Denies exercise intolerance currently but did have significant intolerance as a child. 5 rounds of OCS in the past year which have been helpful. No prior hospitalizations. No first or secondhand smoke.  Up-to-date with vaccines except for COVID. Treatments tried: Breztri 1 puff daily with spacer during respiratory illness only, montelukast deemed not helpful, albuterol not helpful -Normal spirometry (07/10/2023): ratio 0.90, 96% FEV1  Current Meds: Breztri 2 puffs twice daily x 2-week during respiratory flares, Airsupra as needed for rescue Allergic Rhinitis:  Postnasal drainage, watery eyes and itchy eyes, perennial with seasonal flares in spring.  Reports reflux 2-3 times a week, not on medications.  Tonsillectomy and adenoidectomy as well as turbinate reduction 10 to 15 years ago.  Ear tubes as an infant. Treatments tried: Claritin, Zyrtec, Flonase, Benadryl.  Feels Zyrtec and effective. Current meds: Ryaltris, over-the-counter nonsedating antihistamine, allergy eyedrops - SPT  environmental panel (07/10/2023): Positive dust mites, intradermal's positive to cat Eczema: Flares on face and arms, worse in winter. Current meds: Triamcinolone and hydrocortisone 2.5% Reflux: At initial visit, started on omeprazole 40 mg daily x 6 weeks --------------------------------------------------- Today presents for follow-up.   History of Present Illness   Madison Cooper is a 33 year old female with allergies and asthma who presents with allergy symptoms and asthma management.  She experiences significant allergy symptoms, including itchy and popping ears, nasal drip, and mucus in the back of her throat. She had an ear infection following a recent flu episode, treated with antibiotics. Currently, she uses Flonase nasal spray once daily but acknowledges she could increase the frequency to twice daily. She also takes Claritin and Pepcid for her allergies. Itchy eyes are attributed to screen time and fatigue. No current ear infections, but her ears feel 'scratchy' inside.  Regarding her asthma, she has not used her inhaler since recovering from the flu two months ago, during which she required steroids due to severe symptoms. She has not needed her rescue inhaler since then, although she almost used it recently due to stress at work. She is currently not using Breztri regularly but starts it when she feels a cold coming on.  She has a history of recurrent infections, having required antibiotics twice since her last visit, both for the same illness. She has not been hospitalized for infections but has a pattern of needing antibiotics and steroids frequently in the past. 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 On average she reports getting antibiotics twice per year.  Her eczema flared up in early March after exposure to different detergents and fabrics at a hotel, but it resolved after  two weeks.  She is taking  famotidine 20 mg daily for reflux, which is well-controlled.      Chart Review: Prednisone sent for asthma exacerbation on 02/08/2024.  She also has a phone visit 02/01/2024 for acute sinusitis and another video visit on 01/15/2024 for same.  All medications reviewed by clinical staff and updated in chart. No new pertinent medical or surgical history except as noted in HPI.  ROS: All others negative except as noted per HPI.   Objective:  BP 118/74 (BP Location: Left Arm, Patient Position: Sitting, Cuff Size: Large)   Pulse 92   Temp 98.5 F (36.9 C) (Temporal)   Ht 5' 9.5" (1.765 m)   Wt (!) 312 lb (141.5 kg)   SpO2 96%   BMI 45.41 kg/m  Body mass index is 45.41 kg/m. Physical Exam: General Appearance:  Alert, cooperative, no distress, appears stated age  Head:  Normocephalic, without obvious abnormality, atraumatic  Eyes:  Conjunctiva clear, EOM's intact  Ears EACs normal bilaterally and normal TMs bilaterally  Nose: Nares normal, hypertrophic turbinates, normal mucosa, no visible anterior polyps, and septum midline  Throat: Lips, tongue normal; teeth and gums normal, normal posterior oropharynx  Neck: Supple, symmetrical  Lungs:   clear to auscultation bilaterally, Respirations unlabored, no coughing  Heart:  regular rate and rhythm and no murmur, Appears well perfused  Extremities: No edema  Skin: Skin color, texture, turgor normal and no rashes or lesions on visualized portions of skin  Neurologic: No gross deficits   Labs:  Lab Orders         IgG, IgA, IgM         Diphtheria / Tetanus Antibody Panel         Strep pneumoniae 23 Serotypes IgG         CBC with Differential/Platelet      Spirometry:  Tracings reviewed. Her effort: Good reproducible efforts. FVC: 4.23L FEV1: 3.68L, 96% predicted FEV1/FVC ratio: 0.87 Interpretation: Spirometry consistent with normal pattern.  Please see scanned spirometry results for details.  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 - allergy testing 06/2023: positive to dust mites, intradermals positive to cat - Prevention:  - allergen avoidance when possible - consider allergy shots as long term control of your symptoms by teaching your immune system to be more tolerant of your allergy triggers - Symptom control: - Continue Nasacort/Nasonex/Flonase Sensimist 1 sprays in each nostril twice a day as needed for nasal congestion/itchy nose -Add Astelin 1-2 sprays each nostril, twice daily as needed for congestion, itchy ears,  -Add Ipatropium nasal 0.03% 1-2 sprays 2-3 times daily as needed for postnasal drip, runny nose - Continue Antihistamine: daily or daily as needed.   -Options include Zyrtec (Cetirizine) 10mg , Claritin (Loratadine) 10mg , Allegra (Fexofenadine) 180mg , or Xyzal (Levocetirinze) 5mg  - Can be purchased over-the-counter if not covered by insurance.  Allergic Conjunctivitis: at goal - Consider Allergy Eye drops-great options include Pataday (Olopatadine) or Zaditor (ketotifen) for eye symptoms daily as needed-both sold over the counter if not covered by insurance.  -Avoid eye drops that say red eye relief as they may contain medications that dry out your eyes.  Chronic cough-suspect cough variant asthma-at goal Did require round of steroids during the flu in early 2025.  Reviewed asthma action plan, and discussed that if continues to require oral steroids would recommend daily controller inhaler. - your lung testing today looked good today - During respiratory illness  or coughing flares: Continue Breztri  2 puffs  twice daily and continue for 2 weeks or until symptoms resolve.  - use with spacer  - rinse mouth after use. - Rescue Inhaler: Airsupra 2 puffs. Use  every 4-6 hours as needed for chest tightness, wheezing, or coughing.  Can also use 15 minutes prior to exercise if you have symptoms with activity. (Maximum 12 puffs/day) -  Asthma is not controlled if:  - Symptoms are occurring >2 times a week OR  - >2 times a month nighttime awakenings  - You are requiring systemic steroids (prednisone/steroid injections) more than once per year  - Your require hospitalization for your asthma.  - Please call the clinic to schedule a follow up if these symptoms arise  Atopic Dermatitis: at goal Daily Care For Maintenance (daily and continue even once eczema controlled) - Use hypoallergenic hydrating ointment at least twice daily.  This must be done daily for control of flares. (Great options include Vaseline, CeraVe, Aquaphor, Aveeno, Cetaphil, VaniCream, etc) - Avoid detergents, soaps or lotions with fragrances/dyes - Limit showers/baths to 5 minutes and use luke warm water instead of hot, pat dry following baths, and apply moisturizer - can use steroid/non-steroid therapy creams as detailed below up to twice weekly for prevention of flares.  For Flares:(add this to maintenance therapy if needed for flares) First apply steroid/non-steroid treatment creams. Wait 5 minutes then apply moisturizer.  - Triamcinolone 0.1% to body for moderate flares-apply topically twice daily to red, raised areas of skin, followed by moisturizer. Do NOT use on face, groin or armpits. - Hydrocortisone 2.5% to face/body-apply topically twice daily to red, raised areas of skin, followed by moisturizer  Reflux: at goal - lifestyle and diet modifications (see below) - famotidine 20 mg daily as needed  Recurrent sinus infections: around 2 times per year, discussed importance of AIT to help control allergies. Low suspicion for humoral defect.  Overall immune history reassuring, but will check humoral immune labs for reassurance. -humoral immune labs  Follow up : 6 months, sooner if needed It was a pleasure seeing you again in clinic today! Thank you for allowing me to participate in your care.  Other: none  Tonny Bollman, MD  [facility]

## 2024-04-05 LAB — CBC WITH DIFFERENTIAL/PLATELET
Basophils Absolute: 0.1 10*3/uL (ref 0.0–0.2)
Basos: 1 %
EOS (ABSOLUTE): 0.1 10*3/uL (ref 0.0–0.4)
Eos: 1 %
Hematocrit: 46.2 % (ref 34.0–46.6)
Hemoglobin: 15.4 g/dL (ref 11.1–15.9)
Immature Grans (Abs): 0 10*3/uL (ref 0.0–0.1)
Immature Granulocytes: 0 %
Lymphocytes Absolute: 2.5 10*3/uL (ref 0.7–3.1)
Lymphs: 24 %
MCH: 28.4 pg (ref 26.6–33.0)
MCHC: 33.3 g/dL (ref 31.5–35.7)
MCV: 85 fL (ref 79–97)
Monocytes Absolute: 0.6 10*3/uL (ref 0.1–0.9)
Monocytes: 6 %
Neutrophils Absolute: 6.9 10*3/uL (ref 1.4–7.0)
Neutrophils: 68 %
Platelets: 317 10*3/uL (ref 150–450)
RBC: 5.42 x10E6/uL — ABNORMAL HIGH (ref 3.77–5.28)
RDW: 13.6 % (ref 11.7–15.4)
WBC: 10.1 10*3/uL (ref 3.4–10.8)

## 2024-04-05 LAB — STREP PNEUMONIAE 23 SEROTYPES IGG
Pneumo Ab Type 1*: 0.2 ug/mL — ABNORMAL LOW (ref >1.3)
Pneumo Ab Type 14*: 0.1 ug/mL — ABNORMAL LOW (ref >1.3)
Pneumo Ab Type 17 (17F)*: 2.3 ug/mL (ref >1.3)
Pneumo Ab Type 19 (19F)*: 0.1 ug/mL — ABNORMAL LOW (ref >1.3)
Pneumo Ab Type 23 (23F)*: 0.3 ug/mL — ABNORMAL LOW (ref >1.3)
Pneumo Ab Type 34 (10A)*: 1.9 ug/mL (ref >1.3)
Pneumo Ab Type 43 (11A)*: 0.4 ug/mL — ABNORMAL LOW (ref >1.3)
Pneumo Ab Type 54 (15B)*: 8.4 ug/mL (ref >1.3)
Pneumo Ab Type 56 (18C)*: 0.1 ug/mL — ABNORMAL LOW (ref >1.3)
Pneumo Ab Type 57 (19A)*: 0.9 ug/mL — ABNORMAL LOW (ref >1.3)
Pneumo Ab Type 70 (33F)*: 0.2 ug/mL — ABNORMAL LOW (ref >1.3)

## 2024-04-05 LAB — IGG, IGA, IGM
IgA/Immunoglobulin A, Serum: 168 mg/dL (ref 87–352)
IgG (Immunoglobin G), Serum: 1033 mg/dL (ref 586–1602)
IgM (Immunoglobulin M), Srm: 64 mg/dL (ref 26–217)

## 2024-04-05 LAB — DIPHTHERIA / TETANUS ANTIBODY PANEL
Diphtheria Ab: 0.7 [IU]/mL (ref <0.10)
Tetanus Ab, IgG: 1.48 [IU]/mL (ref <0.10)

## 2024-04-05 NOTE — Progress Notes (Signed)
 Please let Madison Cooper know that her immune evaluation has returned and overall is reassuring with the exception of poor response to strep pneumonia titers.  This is likely because she has never had the Pneumovax 23 vaccine typically given to 27 year olds and up.  Would recommend that she get this vaccine and we can repeat these titers in 6 weeks to ensure that her immune system is capable of making an adequate immune response.This can be given in our clinic if we have the vaccine.

## 2024-05-09 DIAGNOSIS — E669 Obesity, unspecified: Secondary | ICD-10-CM | POA: Diagnosis not present

## 2024-05-09 DIAGNOSIS — E039 Hypothyroidism, unspecified: Secondary | ICD-10-CM | POA: Diagnosis not present

## 2024-05-09 DIAGNOSIS — R7303 Prediabetes: Secondary | ICD-10-CM | POA: Diagnosis not present

## 2024-05-09 DIAGNOSIS — Z0000 Encounter for general adult medical examination without abnormal findings: Secondary | ICD-10-CM | POA: Diagnosis not present

## 2024-05-09 DIAGNOSIS — E7800 Pure hypercholesterolemia, unspecified: Secondary | ICD-10-CM | POA: Diagnosis not present

## 2024-05-16 DIAGNOSIS — E039 Hypothyroidism, unspecified: Secondary | ICD-10-CM | POA: Diagnosis not present

## 2024-05-16 DIAGNOSIS — E063 Autoimmune thyroiditis: Secondary | ICD-10-CM | POA: Diagnosis not present

## 2024-05-16 DIAGNOSIS — E669 Obesity, unspecified: Secondary | ICD-10-CM | POA: Diagnosis not present

## 2024-05-16 DIAGNOSIS — Z0000 Encounter for general adult medical examination without abnormal findings: Secondary | ICD-10-CM | POA: Diagnosis not present

## 2024-06-12 ENCOUNTER — Other Ambulatory Visit (HOSPITAL_COMMUNITY): Payer: Self-pay

## 2024-06-12 DIAGNOSIS — J4520 Mild intermittent asthma, uncomplicated: Secondary | ICD-10-CM | POA: Diagnosis not present

## 2024-06-12 DIAGNOSIS — R0989 Other specified symptoms and signs involving the circulatory and respiratory systems: Secondary | ICD-10-CM | POA: Diagnosis not present

## 2024-06-12 MED ORDER — PREDNISONE 10 MG PO TABS
ORAL_TABLET | ORAL | 0 refills | Status: AC
  Filled 2024-06-12: qty 21, 6d supply, fill #0

## 2024-06-12 MED ORDER — GUAIFENESIN-CODEINE 100-10 MG/5ML PO SOLN
5.0000 mL | Freq: Four times a day (QID) | ORAL | 0 refills | Status: AC | PRN
  Filled 2024-06-12: qty 200, 10d supply, fill #0

## 2024-06-24 ENCOUNTER — Other Ambulatory Visit: Payer: Self-pay

## 2024-06-24 ENCOUNTER — Other Ambulatory Visit (HOSPITAL_COMMUNITY): Payer: Self-pay

## 2024-06-24 DIAGNOSIS — E039 Hypothyroidism, unspecified: Secondary | ICD-10-CM | POA: Diagnosis not present

## 2024-06-24 DIAGNOSIS — E063 Autoimmune thyroiditis: Secondary | ICD-10-CM | POA: Diagnosis not present

## 2024-06-24 DIAGNOSIS — E669 Obesity, unspecified: Secondary | ICD-10-CM | POA: Diagnosis not present

## 2024-06-24 DIAGNOSIS — R7303 Prediabetes: Secondary | ICD-10-CM | POA: Diagnosis not present

## 2024-06-24 MED ORDER — METFORMIN HCL ER 750 MG PO TB24
750.0000 mg | ORAL_TABLET | Freq: Two times a day (BID) | ORAL | 5 refills | Status: AC
  Filled 2024-06-24: qty 60, 30d supply, fill #0
  Filled 2024-08-04: qty 60, 30d supply, fill #1
  Filled 2024-09-16: qty 60, 30d supply, fill #0

## 2024-06-24 MED ORDER — LEVOTHYROXINE SODIUM 50 MCG PO TABS
50.0000 ug | ORAL_TABLET | Freq: Every morning | ORAL | 5 refills | Status: AC
  Filled 2024-06-24 – 2024-06-29 (×2): qty 34, 34d supply, fill #0
  Filled 2024-08-04 – 2024-09-16 (×2): qty 34, 30d supply, fill #0

## 2024-06-29 ENCOUNTER — Other Ambulatory Visit (HOSPITAL_COMMUNITY): Payer: Self-pay

## 2024-07-02 ENCOUNTER — Other Ambulatory Visit (HOSPITAL_COMMUNITY): Payer: Self-pay

## 2024-07-08 DIAGNOSIS — F411 Generalized anxiety disorder: Secondary | ICD-10-CM | POA: Diagnosis not present

## 2024-07-08 DIAGNOSIS — F332 Major depressive disorder, recurrent severe without psychotic features: Secondary | ICD-10-CM | POA: Diagnosis not present

## 2024-07-13 ENCOUNTER — Other Ambulatory Visit (HOSPITAL_COMMUNITY): Payer: Self-pay

## 2024-07-16 DIAGNOSIS — F332 Major depressive disorder, recurrent severe without psychotic features: Secondary | ICD-10-CM | POA: Diagnosis not present

## 2024-07-16 DIAGNOSIS — F411 Generalized anxiety disorder: Secondary | ICD-10-CM | POA: Diagnosis not present

## 2024-08-04 ENCOUNTER — Other Ambulatory Visit: Payer: Self-pay | Admitting: Internal Medicine

## 2024-08-05 ENCOUNTER — Other Ambulatory Visit (HOSPITAL_COMMUNITY): Payer: Self-pay

## 2024-08-05 ENCOUNTER — Other Ambulatory Visit: Payer: Self-pay

## 2024-08-05 MED ORDER — BREZTRI AEROSPHERE 160-9-4.8 MCG/ACT IN AERO
INHALATION_SPRAY | RESPIRATORY_TRACT | 1 refills | Status: AC
  Filled 2024-08-05: qty 10.7, 30d supply, fill #0

## 2024-08-05 MED ORDER — AIRSUPRA 90-80 MCG/ACT IN AERO
2.0000 | INHALATION_SPRAY | RESPIRATORY_TRACT | 1 refills | Status: AC | PRN
Start: 2024-08-05 — End: ?
  Filled 2024-08-05: qty 10.7, 10d supply, fill #0

## 2024-08-09 ENCOUNTER — Other Ambulatory Visit (HOSPITAL_COMMUNITY): Payer: Self-pay

## 2024-08-12 DIAGNOSIS — F411 Generalized anxiety disorder: Secondary | ICD-10-CM | POA: Diagnosis not present

## 2024-08-12 DIAGNOSIS — F332 Major depressive disorder, recurrent severe without psychotic features: Secondary | ICD-10-CM | POA: Diagnosis not present

## 2024-09-16 ENCOUNTER — Other Ambulatory Visit (HOSPITAL_COMMUNITY): Payer: Self-pay

## 2024-09-16 ENCOUNTER — Other Ambulatory Visit: Payer: Self-pay

## 2024-09-30 ENCOUNTER — Ambulatory Visit: Admitting: Internal Medicine

## 2024-10-01 ENCOUNTER — Other Ambulatory Visit: Payer: Self-pay

## 2024-10-01 ENCOUNTER — Other Ambulatory Visit (HOSPITAL_COMMUNITY): Payer: Self-pay

## 2024-10-01 MED ORDER — LEVOTHYROXINE SODIUM 50 MCG PO TABS
50.0000 ug | ORAL_TABLET | Freq: Every morning | ORAL | 5 refills | Status: AC
  Filled 2024-10-01: qty 102, 89d supply, fill #0
  Filled 2024-10-29: qty 102, 90d supply, fill #0

## 2024-10-01 MED ORDER — METFORMIN HCL ER 500 MG PO TB24
500.0000 mg | ORAL_TABLET | Freq: Two times a day (BID) | ORAL | 5 refills | Status: AC
  Filled 2024-10-01: qty 60, 30d supply, fill #0

## 2024-10-12 ENCOUNTER — Other Ambulatory Visit (HOSPITAL_COMMUNITY): Payer: Self-pay

## 2024-10-29 ENCOUNTER — Other Ambulatory Visit (HOSPITAL_COMMUNITY): Payer: Self-pay

## 2024-12-21 ENCOUNTER — Encounter: Payer: Self-pay | Admitting: Family

## 2024-12-21 MED ORDER — CEFDINIR 300 MG PO CAPS: 600.0000 mg | ORAL_CAPSULE | Freq: Every day | ORAL | 0 refills | Status: AC
